# Patient Record
Sex: Female | Born: 1985
Health system: Southern US, Community
[De-identification: ages and names within clinical notes are randomized; demographics above are authoritative.]

## PROBLEM LIST (undated history)

## (undated) ENCOUNTER — Inpatient Hospital Stay (HOSPITAL_COMMUNITY): Payer: Self-pay

## (undated) DIAGNOSIS — F329 Major depressive disorder, single episode, unspecified: Secondary | ICD-10-CM

## (undated) DIAGNOSIS — F419 Anxiety disorder, unspecified: Secondary | ICD-10-CM

## (undated) DIAGNOSIS — E119 Type 2 diabetes mellitus without complications: Secondary | ICD-10-CM

## (undated) DIAGNOSIS — Z5189 Encounter for other specified aftercare: Secondary | ICD-10-CM

## (undated) DIAGNOSIS — I10 Essential (primary) hypertension: Secondary | ICD-10-CM

## (undated) DIAGNOSIS — F32A Depression, unspecified: Secondary | ICD-10-CM

## (undated) HISTORY — PX: NO PAST SURGERIES: SHX2092

---

## 2017-10-16 ENCOUNTER — Encounter (HOSPITAL_COMMUNITY): Payer: Self-pay

## 2017-10-16 LAB — OB RESULTS CONSOLE HIV ANTIBODY (ROUTINE TESTING): HIV: NONREACTIVE

## 2017-10-16 LAB — OB RESULTS CONSOLE RPR: RPR: NONREACTIVE

## 2017-10-16 LAB — OB RESULTS CONSOLE ABO/RH: RH Type: POSITIVE

## 2017-10-16 LAB — OB RESULTS CONSOLE HEPATITIS B SURFACE ANTIGEN: HEP B S AG: NEGATIVE

## 2017-10-16 LAB — OB RESULTS CONSOLE RUBELLA ANTIBODY, IGM: Rubella: IMMUNE

## 2017-10-23 ENCOUNTER — Other Ambulatory Visit (HOSPITAL_COMMUNITY): Payer: Self-pay | Admitting: Obstetrics and Gynecology

## 2017-10-23 DIAGNOSIS — Z3689 Encounter for other specified antenatal screening: Secondary | ICD-10-CM

## 2017-10-27 ENCOUNTER — Other Ambulatory Visit: Payer: Self-pay

## 2017-10-30 ENCOUNTER — Encounter (HOSPITAL_COMMUNITY): Payer: Self-pay | Admitting: *Deleted

## 2017-10-31 ENCOUNTER — Ambulatory Visit (HOSPITAL_COMMUNITY)
Admission: RE | Admit: 2017-10-31 | Discharge: 2017-10-31 | Disposition: A | Discharge status: Home / Self Care | Payer: Medicaid Other | Source: Ambulatory Visit | Attending: Obstetrics and Gynecology | Admitting: Obstetrics and Gynecology

## 2017-10-31 HISTORY — DX: Type 2 diabetes mellitus without complications: E11.9

## 2017-11-01 ENCOUNTER — Ambulatory Visit (HOSPITAL_COMMUNITY)
Admission: RE | Admit: 2017-11-01 | Payer: Medicaid Other | Source: Ambulatory Visit | Attending: Obstetrics & Gynecology | Admitting: Obstetrics & Gynecology

## 2017-11-06 ENCOUNTER — Encounter (HOSPITAL_COMMUNITY): Payer: Self-pay

## 2017-11-11 ENCOUNTER — Encounter (HOSPITAL_COMMUNITY): Payer: Self-pay

## 2017-11-13 ENCOUNTER — Other Ambulatory Visit (HOSPITAL_COMMUNITY): Payer: Self-pay | Admitting: *Deleted

## 2017-11-13 ENCOUNTER — Ambulatory Visit (HOSPITAL_COMMUNITY)
Admission: RE | Admit: 2017-11-13 | Payer: Medicaid Other | Source: Ambulatory Visit | Attending: Obstetrics & Gynecology | Admitting: Obstetrics & Gynecology

## 2017-11-13 ENCOUNTER — Ambulatory Visit (HOSPITAL_COMMUNITY)
Admission: RE | Admit: 2017-11-13 | Discharge: 2017-11-13 | Disposition: A | Discharge status: Home / Self Care | Payer: Medicaid Other | Source: Ambulatory Visit | Attending: Obstetrics and Gynecology | Admitting: Obstetrics and Gynecology

## 2017-11-13 ENCOUNTER — Ambulatory Visit (HOSPITAL_COMMUNITY): Payer: Medicaid Other

## 2017-11-13 ENCOUNTER — Encounter (HOSPITAL_COMMUNITY): Payer: Self-pay

## 2017-11-13 DIAGNOSIS — O30042 Twin pregnancy, dichorionic/diamniotic, second trimester: Secondary | ICD-10-CM | POA: Diagnosis present

## 2017-11-13 DIAGNOSIS — O24112 Pre-existing diabetes mellitus, type 2, in pregnancy, second trimester: Secondary | ICD-10-CM | POA: Insufficient documentation

## 2017-11-13 DIAGNOSIS — E119 Type 2 diabetes mellitus without complications: Secondary | ICD-10-CM

## 2017-11-13 DIAGNOSIS — Z363 Encounter for antenatal screening for malformations: Secondary | ICD-10-CM | POA: Insufficient documentation

## 2017-11-13 DIAGNOSIS — O99212 Obesity complicating pregnancy, second trimester: Secondary | ICD-10-CM

## 2017-11-13 DIAGNOSIS — O289 Unspecified abnormal findings on antenatal screening of mother: Secondary | ICD-10-CM | POA: Diagnosis not present

## 2017-11-13 DIAGNOSIS — Z3A20 20 weeks gestation of pregnancy: Secondary | ICD-10-CM | POA: Diagnosis not present

## 2017-11-13 DIAGNOSIS — Z3689 Encounter for other specified antenatal screening: Secondary | ICD-10-CM

## 2017-11-13 HISTORY — DX: Essential (primary) hypertension: I10

## 2017-11-22 ENCOUNTER — Ambulatory Visit (HOSPITAL_COMMUNITY)
Admission: RE | Admit: 2017-11-22 | Payer: Medicaid Other | Source: Ambulatory Visit | Attending: Obstetrics & Gynecology | Admitting: Obstetrics & Gynecology

## 2017-11-22 ENCOUNTER — Encounter (HOSPITAL_COMMUNITY): Payer: Self-pay

## 2017-12-11 ENCOUNTER — Ambulatory Visit (HOSPITAL_COMMUNITY)
Admission: RE | Admit: 2017-12-11 | Discharge: 2017-12-11 | Disposition: A | Discharge status: Home / Self Care | Payer: Medicaid Other | Source: Ambulatory Visit | Attending: Obstetrics and Gynecology | Admitting: Obstetrics and Gynecology

## 2017-12-11 ENCOUNTER — Encounter (HOSPITAL_COMMUNITY): Payer: Self-pay

## 2017-12-11 ENCOUNTER — Ambulatory Visit (HOSPITAL_COMMUNITY): Payer: Medicaid Other

## 2018-01-18 ENCOUNTER — Encounter (HOSPITAL_COMMUNITY): Payer: Self-pay

## 2018-01-18 ENCOUNTER — Emergency Department (HOSPITAL_COMMUNITY)
Admission: EM | Admit: 2018-01-18 | Discharge: 2018-01-18 | Disposition: A | Discharge status: Home / Self Care | Payer: Medicaid Other | Attending: Emergency Medicine | Admitting: Emergency Medicine

## 2018-01-18 ENCOUNTER — Other Ambulatory Visit: Payer: Self-pay

## 2018-01-18 DIAGNOSIS — Z3A30 30 weeks gestation of pregnancy: Secondary | ICD-10-CM | POA: Insufficient documentation

## 2018-01-18 DIAGNOSIS — Z79899 Other long term (current) drug therapy: Secondary | ICD-10-CM | POA: Diagnosis not present

## 2018-01-18 DIAGNOSIS — O219 Vomiting of pregnancy, unspecified: Secondary | ICD-10-CM | POA: Diagnosis present

## 2018-01-18 DIAGNOSIS — I10 Essential (primary) hypertension: Secondary | ICD-10-CM | POA: Diagnosis not present

## 2018-01-18 DIAGNOSIS — E119 Type 2 diabetes mellitus without complications: Secondary | ICD-10-CM | POA: Diagnosis not present

## 2018-01-18 DIAGNOSIS — Z794 Long term (current) use of insulin: Secondary | ICD-10-CM | POA: Insufficient documentation

## 2018-01-18 DIAGNOSIS — O30043 Twin pregnancy, dichorionic/diamniotic, third trimester: Secondary | ICD-10-CM | POA: Insufficient documentation

## 2018-01-18 DIAGNOSIS — F17210 Nicotine dependence, cigarettes, uncomplicated: Secondary | ICD-10-CM | POA: Diagnosis not present

## 2018-01-18 LAB — COMPREHENSIVE METABOLIC PANEL
ALK PHOS: 87 U/L (ref 38–126)
ALT: 8 U/L (ref 0–44)
AST: 13 U/L — AB (ref 15–41)
Albumin: 2.7 g/dL — ABNORMAL LOW (ref 3.5–5.0)
Anion gap: 12 (ref 5–15)
BILIRUBIN TOTAL: 1.2 mg/dL (ref 0.3–1.2)
BUN: 7 mg/dL (ref 6–20)
CO2: 19 mmol/L — ABNORMAL LOW (ref 22–32)
CREATININE: 0.55 mg/dL (ref 0.44–1.00)
Calcium: 9 mg/dL (ref 8.9–10.3)
Chloride: 100 mmol/L (ref 98–111)
GFR calc Af Amer: >60 mL/min (ref >60)
Glucose, Bld: 179 mg/dL — ABNORMAL HIGH (ref 70–99)
Potassium: 3.7 mmol/L (ref 3.5–5.1)
Sodium: 131 mmol/L — ABNORMAL LOW (ref 135–145)
TOTAL PROTEIN: 6.6 g/dL (ref 6.5–8.1)

## 2018-01-18 LAB — LIPASE, BLOOD: LIPASE: 28 U/L (ref 11–51)

## 2018-01-18 LAB — CBC WITH DIFFERENTIAL/PLATELET
Abs Immature Granulocytes: 0.03 10*3/uL (ref 0.00–0.07)
BASOS PCT: 0 %
Basophils Absolute: 0 10*3/uL (ref 0.0–0.1)
EOS PCT: 1 %
Eosinophils Absolute: 0.1 10*3/uL (ref 0.0–0.5)
HEMATOCRIT: 44.3 % (ref 36.0–46.0)
Hemoglobin: 15.3 g/dL — ABNORMAL HIGH (ref 12.0–15.0)
IMMATURE GRANULOCYTES: 0 %
LYMPHS PCT: 24 %
Lymphs Abs: 2.5 10*3/uL (ref 0.7–4.0)
MCH: 30.2 pg (ref 26.0–34.0)
MCHC: 34.5 g/dL (ref 30.0–36.0)
MCV: 87.4 fL (ref 80.0–100.0)
MONO ABS: 0.9 10*3/uL (ref 0.1–1.0)
Monocytes Relative: 9 %
Neutro Abs: 7.2 10*3/uL (ref 1.7–7.7)
Neutrophils Relative %: 66 %
PLATELETS: 255 10*3/uL (ref 150–400)
RBC: 5.07 MIL/uL (ref 3.87–5.11)
RDW: 12.9 % (ref 11.5–15.5)
WBC: 10.7 10*3/uL — AB (ref 4.0–10.5)
nRBC: 0 % (ref 0.0–0.2)

## 2018-01-18 LAB — URINALYSIS, ROUTINE W REFLEX MICROSCOPIC
Bilirubin Urine: NEGATIVE
GLUCOSE, UA: 150 mg/dL — AB
Hgb urine dipstick: NEGATIVE
Ketones, ur: 80 mg/dL — AB
Nitrite: POSITIVE — AB
PH: 6 (ref 5.0–8.0)
Protein, ur: 100 mg/dL — AB
Specific Gravity, Urine: 1.029 (ref 1.005–1.030)

## 2018-01-18 LAB — CBG MONITORING, ED: Glucose-Capillary: 189 mg/dL — ABNORMAL HIGH (ref 70–99)

## 2018-01-18 MED ORDER — METOCLOPRAMIDE HCL 10 MG PO TABS: 10.0000 mg | ORAL_TABLET | Freq: Four times a day (QID) | ORAL | 0 refills | Status: DC | PRN

## 2018-01-18 MED ORDER — AMOXICILLIN 500 MG PO CAPS: 500.0000 mg | ORAL_CAPSULE | Freq: Three times a day (TID) | ORAL | 0 refills | Status: DC

## 2018-01-18 MED ORDER — METOCLOPRAMIDE HCL 5 MG/ML IJ SOLN
5.0000 mg | Freq: Once | INTRAMUSCULAR | Status: AC
  Administered 2018-01-18: 5 mg via INTRAVENOUS
  Filled 2018-01-18: qty 2

## 2018-01-18 MED ORDER — AMOXICILLIN 500 MG PO CAPS
500.0000 mg | ORAL_CAPSULE | Freq: Once | ORAL | Status: AC
  Administered 2018-01-18: 500 mg via ORAL
  Filled 2018-01-18: qty 1

## 2018-01-18 MED ORDER — SODIUM CHLORIDE 0.9 % IV BOLUS
2000.0000 mL | Freq: Once | INTRAVENOUS | Status: AC
  Administered 2018-01-18: 2000 mL via INTRAVENOUS

## 2018-01-18 NOTE — ED Provider Notes (Signed)
[facility] EMERGENCY [facility] Provider Note   CSN: [id number] Arrival date & time: 01/18/18  1036     History   Chief Complaint Chief Complaint  Patient presents with  . Emesis    HPI Ellen KaufmannJennifer Martin is a 32 y.o. female.  Patient reports she is been vomiting for 2 days and has not been able to hold anything down without vomiting for the past 2 days.  She denies pain anywhere denies fever denies urinary complaint.  She feels mildly nauseated at present.  She does feel the baby is moving normally.  She has not recently checked her blood sugar.  Other associated symptoms include generalized weakness.  She is currently pregnant with twins, followed in high risk clinic.  HPI  Past Medical History:  Diagnosis Date  . Diabetes mellitus without complication (HCC)    Type 2  . Hypertension     There are no active problems to display for this patient.   Past Surgical History:  Procedure Laterality Date  . NO PAST SURGERIES       OB History    Gravida  7   Para  3   Term  3   Preterm      AB  3   Living  3     SAB  3   TAB      Ectopic      Multiple      Live Births             Currently pregnant with twins.  3 miscarriages.  3 term vaginal deliveries  Home Medications    Prior to Admission medications   Medication Sig Start Date End Date Taking? Authorizing Provider  Acetaminophen (TYLENOL PO) Take by mouth.    [provider]  insulin NPH Human (HUMULIN N,NOVOLIN N) 100 UNIT/ML injection Inject into the skin.    [provider]  insulin regular (NOVOLIN R,HUMULIN R) 100 units/mL injection Inject into the skin 3 (three) times daily before meals.    [provider]  Prenatal Vit-Fe Fumarate-FA (PRENATAL VITAMIN PO) Take by mouth.    [provider]    Family History Family History  Problem Relation Age of Onset  . Hypertension Mother   . Diabetes Mother   . Hypertension Maternal Grandmother    . Diabetes Maternal Grandmother     Social History Social History   Tobacco Use  . Smoking status: Current Every Day Smoker    Packs/day: 0.25  . Smokeless tobacco: Never Used  Substance Use Topics  . Alcohol use: Not Currently  . Drug use: Not Currently   No alcohol no drugs  Allergies   Patient has no known allergies.   Review of Systems Review of Systems  Constitutional: Negative.   HENT: Negative.   Respiratory: Negative.   Cardiovascular: Negative.   Gastrointestinal: Positive for nausea and vomiting.  Musculoskeletal: Negative.   Skin: Negative.   Allergic/Immunologic: Positive for immunocompromised state.       Diabetic  Neurological: Positive for weakness.       Generalized weakness  Psychiatric/Behavioral: Negative.   All other systems reviewed and are negative.    Physical Exam Updated Vital Signs LMP 07/12/2017   Physical Exam  Constitutional: She appears well-developed and well-nourished. No distress.  HENT:  Head: Normocephalic and atraumatic.  Mucous membranes dry  Eyes: Pupils are equal, round, and reactive to light. Conjunctivae are normal.  Neck: Neck supple. No tracheal deviation present. No thyromegaly present.  Cardiovascular: Regular rhythm.  No murmur heard. Mildly tachycardic  Pulmonary/Chest: Effort normal and breath sounds normal.  Abdominal: Soft. Bowel sounds are normal. She exhibits no distension. There is no tenderness.  Gravid.  There are 2 separate fetal heart tones.  Baby A 135 to 140 bpm.  BabyB 145-150 bpm  Musculoskeletal: Normal range of motion. She exhibits no edema or tenderness.  Neurological: She is alert. Coordination normal.  Skin: Skin is warm and dry. No rash noted.  Psychiatric: She has a normal mood and affect.  Nursing note and vitals reviewed.    ED Treatments / Results  Labs (all labs ordered are listed, but only abnormal results are displayed) Labs Reviewed  URINALYSIS, ROUTINE W REFLEX MICROSCOPIC    COMPREHENSIVE METABOLIC PANEL  CBC WITH DIFFERENTIAL/PLATELET  LIPASE, BLOOD   Results for orders placed or performed during the hospital encounter of 01/18/18  Urinalysis, Routine w reflex microscopic  Result Value Ref Range   Color, Urine AMBER (A) YELLOW   APPearance HAZY (A) CLEAR   Specific Gravity, Urine 1.029 1.005 - 1.030   pH 6.0 5.0 - 8.0   Glucose, UA 150 (A) NEGATIVE mg/dL   Hgb urine dipstick NEGATIVE NEGATIVE   Bilirubin Urine NEGATIVE NEGATIVE   Ketones, ur 80 (A) NEGATIVE mg/dL   Protein, ur 096 (A) NEGATIVE mg/dL   Nitrite POSITIVE (A) NEGATIVE   Leukocytes, UA TRACE (A) NEGATIVE   RBC / HPF 0-5 0 - 5 RBC/hpf   WBC, UA 6-10 0 - 5 WBC/hpf   Bacteria, UA MANY (A) NONE SEEN   Squamous Epithelial / LPF 0-5 0 - 5   Mucus PRESENT   Comprehensive metabolic panel  Result Value Ref Range   Sodium 131 (L) 135 - 145 mmol/L   Potassium 3.7 3.5 - 5.1 mmol/L   Chloride 100 98 - 111 mmol/L   CO2 19 (L) 22 - 32 mmol/L   Glucose, Bld 179 (H) 70 - 99 mg/dL   BUN 7 6 - 20 mg/dL   Creatinine, Ser 0.45 0.44 - 1.00 mg/dL   Calcium 9.0 8.9 - 40.9 mg/dL   Total Protein 6.6 6.5 - 8.1 g/dL   Albumin 2.7 (L) 3.5 - 5.0 g/dL   AST 13 (L) 15 - 41 U/L   ALT 8 0 - 44 U/L   Alkaline Phosphatase 87 38 - 126 U/L   Total Bilirubin 1.2 0.3 - 1.2 mg/dL   GFR calc non Af Amer >60 >60 mL/min   GFR calc Af Amer >60 >60 mL/min   Anion gap 12 5 - 15  CBC with Differential/Platelet  Result Value Ref Range   WBC 10.7 (H) 4.0 - 10.5 K/uL   RBC 5.07 3.87 - 5.11 MIL/uL   Hemoglobin 15.3 (H) 12.0 - 15.0 g/dL   HCT 81.1 91.4 - 78.2 %   MCV 87.4 80.0 - 100.0 fL   MCH 30.2 26.0 - 34.0 pg   MCHC 34.5 30.0 - 36.0 g/dL   RDW 95.6 21.3 - 08.6 %   Platelets 255 150 - 400 K/uL   nRBC 0.0 0.0 - 0.2 %   Neutrophils Relative % 66 %   Neutro Abs 7.2 1.7 - 7.7 K/uL   Lymphocytes Relative 24 %   Lymphs Abs 2.5 0.7 - 4.0 K/uL   Monocytes Relative 9 %   Monocytes Absolute 0.9 0.1 - 1.0 K/uL    Eosinophils Relative 1 %   Eosinophils Absolute 0.1 0.0 - 0.5 K/uL   Basophils Relative 0 %  Basophils Absolute 0.0 0.0 - 0.1 K/uL   Immature Granulocytes 0 %   Abs Immature Granulocytes 0.03 0.00 - 0.07 K/uL  Lipase, blood  Result Value Ref Range   Lipase 28 11 - 51 U/L  CBG monitoring, ED  Result Value Ref Range   Glucose-Capillary 189 (H) 70 - 99 mg/dL   No results found.  EKG None  Radiology No results found.  Procedures Procedures (including critical care time)  Medications Ordered in ED Medications  sodium chloride 0.9 % bolus 2,000 mL (has no administration in time range)     Initial Impression / Assessment and Plan / ED Course  I have reviewed the triage vital signs and the nursing notes.  Pertinent labs & imaging results that were available during my care of the patient were reviewed by me and considered in my medical decision making (see chart for details).     11:55 AM patient reports transient improvement of nausea after treatment with intravenous Reglan.  And IV fluids however nauseous at this time coming back somewhat.  Additional IV Reglan ordered 1:30 PM patient feels much improved after treatment with intravenous hydration.  She is able to drink water without nausea or vomiting.  She is not lightheaded on standing.  Is ready for discharge.  I Counseled patient for 5 minutes on smoking cessation plan encourage oral hydration.  Follow-up with OB/GYN next week.  Urine sent for culture.  Lab work consistent with bacteriuria. prescriptions Reglan, amoxicillin.  She is encouraged to try ginger for vomiting before Reglan. Final Clinical Impressions(s) / ED Diagnoses  Diagnoses #1 vomiting during pregnancy #2 bacteriuria #3 tobacco abuse Final diagnoses:  None    ED Discharge Orders    None       Doug Sou, MD 01/18/18 1342

## 2018-01-18 NOTE — ED Triage Notes (Signed)
Pt currently pregnant with twins with continuous N/v x 2 days. Called [facility] where she is followed as a high risk patient and was told to come to ER for evaluation and treatment. 7th pregnancy, 3 miscarriages. Current pregnancy without any other complications, feeling fetal mvmts. Denies fever, chills. Reports weakness. Currently smokes, denies alcohol use. Hx DM, has not checked cbg today. Dr. Ethelda ChickJacubowitz at bedside.

## 2018-01-18 NOTE — ED Notes (Signed)
Got patient undress into a gown on the the monitor checked patient cbg it was [id number] notified Dr Ethelda ChickJacubowitz of blood sugar patient is resting with call bell in reach and nurse at bedside

## 2018-01-18 NOTE — Discharge Instructions (Signed)
Make sure that you drink at least six 8 ounce glasses of water or each day in order to stay well-hydrated.  Try ginger for nausea and vomiting.  If Ginger does not work you can take the medication prescribed as needed for nausea.  Follow-up with your OB/GYN doctor next week.  Ask your doctor to help you to stop smoking.  If your vomiting is not well controlled with the medication prescribed or if you feel worse for any reason go to the maternity admissions unit at [facility]

## 2018-01-18 NOTE — Progress Notes (Signed)
Pt is a G7P3 at 30 1/[redacted] weeks gestation here because she has had N&V x 2 days. Says the last time she vomited was last night around 8 pm. Denies any diarreah. Denies vaginal bleeding or leaking of fluid. Says she had gestational diabetes with her last pregnancy and has cont to be insulin diabetic. Pt says she is a pt of [facility] and sees Nigel BridgemanVicki Latham CNM there. Pt says she has not checked her blood sugar in a couple of days. Glucose drawn here is 189. Pt has Mercie Eoni Di twins.

## 2018-01-18 NOTE — Progress Notes (Signed)
Spoke with Dr. Richardson Doppole. Pt is a G7p3 at 30 1/[redacted] weeks gestation here because she is having N&V. She is a  Pt of [facility] and sees Nigel BridgemanVicki Latham CNM there. She is pregnant with Mercie Eoni Di twins. Baby A FHR is 145-150 BPM, Baby B FHR is 135-140. Mod variability, accels, no decels for either baby. Ui noted. Pt denies uc's. No vaginal bleeding or leaking of fluid. Pt is a insulin dependentt type 2 diabetic. Her blood glucose is 179. Pt has a UTI. She is getting IVF, IV reglan. Dr. Richardson Doppole says that pt can be hydrated, treated for her UTI, she may have zoran if the reglan doesn't work. She is OB cleared. ED staff notified.

## 2018-01-20 LAB — URINE CULTURE
Culture: >=100000 — AB
Special Requests: NORMAL

## 2018-01-21 ENCOUNTER — Telehealth: Payer: Self-pay | Admitting: Emergency Medicine

## 2018-01-21 NOTE — Telephone Encounter (Signed)
Post ED Visit - Positive Culture Follow-up  Culture report reviewed by antimicrobial stewardship pharmacist:  []  Enzo BiNathan Batchelder, Pharm.D. []  Celedonio MiyamotoJeremy Frens, Pharm.D., BCPS AQ-ID []  Garvin FilaMike Maccia, Pharm.D., BCPS []  Georgina PillionElizabeth Martin, Pharm.D., BCPS []  ManahawkinMinh Pham, [address]Pharm.D., BCPS, AAHIVP []  Estella HuskMichelle Turner, Pharm.D., BCPS, AAHIVP [x]  Lysle Pearlachel Rumbarger, PharmD, BCPS []  Phillips Climeshuy Dang, PharmD, BCPS []  Agapito GamesAlison Masters, PharmD, BCPS []  Verlan FriendsErin Deja, PharmD  Positive urine culture Treated with amoxicillin, organism sensitive to the same and no further patient follow-up is required at this time.  Berle MullMiller, Ryen Heitmeyer 01/21/2018, 11:06 AM

## 2018-01-25 ENCOUNTER — Inpatient Hospital Stay (HOSPITAL_COMMUNITY): Payer: Medicaid Other

## 2018-01-25 ENCOUNTER — Inpatient Hospital Stay (HOSPITAL_COMMUNITY)
Admission: AD | Admit: 2018-01-25 | Discharge: 2018-01-30 | DRG: 832 | Disposition: A | Discharge status: Home / Self Care | Payer: Medicaid Other | Attending: Obstetrics & Gynecology | Admitting: Obstetrics & Gynecology

## 2018-01-25 ENCOUNTER — Inpatient Hospital Stay (HOSPITAL_BASED_OUTPATIENT_CLINIC_OR_DEPARTMENT_OTHER): Payer: Medicaid Other

## 2018-01-25 ENCOUNTER — Encounter (HOSPITAL_COMMUNITY): Payer: Self-pay

## 2018-01-25 DIAGNOSIS — F1910 Other psychoactive substance abuse, uncomplicated: Secondary | ICD-10-CM | POA: Diagnosis present

## 2018-01-25 DIAGNOSIS — K8020 Calculus of gallbladder without cholecystitis without obstruction: Secondary | ICD-10-CM

## 2018-01-25 DIAGNOSIS — O99323 Drug use complicating pregnancy, third trimester: Secondary | ICD-10-CM | POA: Diagnosis present

## 2018-01-25 DIAGNOSIS — O99613 Diseases of the digestive system complicating pregnancy, third trimester: Secondary | ICD-10-CM | POA: Diagnosis present

## 2018-01-25 DIAGNOSIS — O99213 Obesity complicating pregnancy, third trimester: Secondary | ICD-10-CM

## 2018-01-25 DIAGNOSIS — O26893 Other specified pregnancy related conditions, third trimester: Secondary | ICD-10-CM

## 2018-01-25 DIAGNOSIS — O139 Gestational [pregnancy-induced] hypertension without significant proteinuria, unspecified trimester: Secondary | ICD-10-CM | POA: Diagnosis present

## 2018-01-25 DIAGNOSIS — O30043 Twin pregnancy, dichorionic/diamniotic, third trimester: Secondary | ICD-10-CM

## 2018-01-25 DIAGNOSIS — N390 Urinary tract infection, site not specified: Secondary | ICD-10-CM

## 2018-01-25 DIAGNOSIS — O289 Unspecified abnormal findings on antenatal screening of mother: Secondary | ICD-10-CM | POA: Diagnosis not present

## 2018-01-25 DIAGNOSIS — E119 Type 2 diabetes mellitus without complications: Secondary | ICD-10-CM

## 2018-01-25 DIAGNOSIS — E1165 Type 2 diabetes mellitus with hyperglycemia: Secondary | ICD-10-CM | POA: Diagnosis present

## 2018-01-25 DIAGNOSIS — F17210 Nicotine dependence, cigarettes, uncomplicated: Secondary | ICD-10-CM | POA: Diagnosis present

## 2018-01-25 DIAGNOSIS — Z794 Long term (current) use of insulin: Secondary | ICD-10-CM | POA: Diagnosis not present

## 2018-01-25 DIAGNOSIS — Z3A31 31 weeks gestation of pregnancy: Secondary | ICD-10-CM | POA: Diagnosis not present

## 2018-01-25 DIAGNOSIS — O99820 Streptococcus B carrier state complicating pregnancy: Secondary | ICD-10-CM | POA: Diagnosis present

## 2018-01-25 DIAGNOSIS — O24419 Gestational diabetes mellitus in pregnancy, unspecified control: Secondary | ICD-10-CM

## 2018-01-25 DIAGNOSIS — O2343 Unspecified infection of urinary tract in pregnancy, third trimester: Secondary | ICD-10-CM | POA: Diagnosis present

## 2018-01-25 DIAGNOSIS — O24913 Unspecified diabetes mellitus in pregnancy, third trimester: Secondary | ICD-10-CM | POA: Diagnosis not present

## 2018-01-25 DIAGNOSIS — O30042 Twin pregnancy, dichorionic/diamniotic, second trimester: Secondary | ICD-10-CM | POA: Diagnosis not present

## 2018-01-25 DIAGNOSIS — O99333 Smoking (tobacco) complicating pregnancy, third trimester: Secondary | ICD-10-CM | POA: Diagnosis present

## 2018-01-25 DIAGNOSIS — B9620 Unspecified Escherichia coli [E. coli] as the cause of diseases classified elsewhere: Secondary | ICD-10-CM | POA: Diagnosis present

## 2018-01-25 DIAGNOSIS — IMO0002 Reserved for concepts with insufficient information to code with codable children: Secondary | ICD-10-CM

## 2018-01-25 DIAGNOSIS — O24113 Pre-existing diabetes mellitus, type 2, in pregnancy, third trimester: Secondary | ICD-10-CM

## 2018-01-25 DIAGNOSIS — O99212 Obesity complicating pregnancy, second trimester: Secondary | ICD-10-CM | POA: Diagnosis not present

## 2018-01-25 DIAGNOSIS — R109 Unspecified abdominal pain: Secondary | ICD-10-CM

## 2018-01-25 DIAGNOSIS — O24313 Unspecified pre-existing diabetes mellitus in pregnancy, third trimester: Secondary | ICD-10-CM

## 2018-01-25 DIAGNOSIS — O24112 Pre-existing diabetes mellitus, type 2, in pregnancy, second trimester: Secondary | ICD-10-CM | POA: Diagnosis not present

## 2018-01-25 DIAGNOSIS — O24119 Pre-existing diabetes mellitus, type 2, in pregnancy, unspecified trimester: Secondary | ICD-10-CM

## 2018-01-25 DIAGNOSIS — R1011 Right upper quadrant pain: Secondary | ICD-10-CM | POA: Diagnosis present

## 2018-01-25 LAB — COMPREHENSIVE METABOLIC PANEL
ALT: 10 U/L (ref 0–44)
ANION GAP: 10 (ref 5–15)
AST: 14 U/L — ABNORMAL LOW (ref 15–41)
Albumin: 2.6 g/dL — ABNORMAL LOW (ref 3.5–5.0)
Alkaline Phosphatase: 83 U/L (ref 38–126)
BUN: 9 mg/dL (ref 6–20)
CALCIUM: 8.5 mg/dL — AB (ref 8.9–10.3)
CO2: 18 mmol/L — AB (ref 22–32)
CREATININE: 0.44 mg/dL (ref 0.44–1.00)
Chloride: 103 mmol/L (ref 98–111)
GFR calc Af Amer: >60 mL/min (ref >60)
GFR calc non Af Amer: >60 mL/min (ref >60)
GLUCOSE: 208 mg/dL — AB (ref 70–99)
Potassium: 3.5 mmol/L (ref 3.5–5.1)
Sodium: 131 mmol/L — ABNORMAL LOW (ref 135–145)
Total Bilirubin: 0.8 mg/dL (ref 0.3–1.2)
Total Protein: 6.2 g/dL — ABNORMAL LOW (ref 6.5–8.1)

## 2018-01-25 LAB — PROTEIN / CREATININE RATIO, URINE
Creatinine, Urine: 191 mg/dL
PROTEIN CREATININE RATIO: 0.23 mg/mg{creat} — AB (ref 0.00–0.15)
Total Protein, Urine: 44 mg/dL

## 2018-01-25 LAB — GLUCOSE, CAPILLARY
GLUCOSE-CAPILLARY: 194 mg/dL — AB (ref 70–99)
Glucose-Capillary: 191 mg/dL — ABNORMAL HIGH (ref 70–99)
Glucose-Capillary: 175 mg/dL — ABNORMAL HIGH (ref 70–99)
Glucose-Capillary: 160 mg/dL — ABNORMAL HIGH (ref 70–99)

## 2018-01-25 LAB — CBC
HEMATOCRIT: 39.4 % (ref 36.0–46.0)
HEMOGLOBIN: 14 g/dL (ref 12.0–15.0)
MCH: 31.3 pg (ref 26.0–34.0)
MCHC: 35.5 g/dL (ref 30.0–36.0)
MCV: 87.9 fL (ref 80.0–100.0)
NRBC: 0 % (ref 0.0–0.2)
PLATELETS: 219 10*3/uL (ref 150–400)
RBC: 4.48 MIL/uL (ref 3.87–5.11)
RDW: 13.4 % (ref 11.5–15.5)
WBC: 11.3 10*3/uL — AB (ref 4.0–10.5)

## 2018-01-25 LAB — URINALYSIS, ROUTINE W REFLEX MICROSCOPIC
Bilirubin Urine: NEGATIVE
KETONES UR: 20 mg/dL — AB
LEUKOCYTES UA: NEGATIVE
Nitrite: NEGATIVE
PH: 5 (ref 5.0–8.0)
Protein, ur: 30 mg/dL — AB
RBC / HPF: >50 RBC/hpf — ABNORMAL HIGH (ref 0–5)
Specific Gravity, Urine: 1.026 (ref 1.005–1.030)
WBC, UA: >50 WBC/hpf — ABNORMAL HIGH (ref 0–5)

## 2018-01-25 LAB — OB RESULTS CONSOLE GBS: STREP GROUP B AG: POSITIVE

## 2018-01-25 LAB — TYPE AND SCREEN
ABO/RH(D): O POS
ANTIBODY SCREEN: NEGATIVE

## 2018-01-25 LAB — LIPASE, BLOOD: Lipase: 23 U/L (ref 11–51)

## 2018-01-25 LAB — RAPID URINE DRUG SCREEN, HOSP PERFORMED
AMPHETAMINES: POSITIVE — AB
BARBITURATES: NOT DETECTED
BENZODIAZEPINES: NOT DETECTED
Cocaine: NOT DETECTED
Opiates: NOT DETECTED
Tetrahydrocannabinol: POSITIVE — AB

## 2018-01-25 LAB — ABO/RH: ABO/RH(D): O POS

## 2018-01-25 LAB — GROUP B STREP BY PCR: Group B strep by PCR: POSITIVE — AB

## 2018-01-25 MED ORDER — MAGNESIUM SULFATE 40 G IN LACTATED RINGERS - SIMPLE
2.0000 g/h | INTRAVENOUS | Status: DC
  Administered 2018-01-25: 2 g/h via INTRAVENOUS
  Filled 2018-01-25: qty 500

## 2018-01-25 MED ORDER — FENTANYL CITRATE (PF) 100 MCG/2ML IJ SOLN
100.0000 ug | Freq: Once | INTRAMUSCULAR | Status: AC
  Administered 2018-01-25: 100 ug via INTRAVENOUS
  Filled 2018-01-25: qty 2

## 2018-01-25 MED ORDER — FENTANYL CITRATE (PF) 100 MCG/2ML IJ SOLN
100.0000 ug | INTRAMUSCULAR | Status: DC | PRN
  Administered 2018-01-25: 100 ug via INTRAVENOUS
  Filled 2018-01-25: qty 2

## 2018-01-25 MED ORDER — HYOSCYAMINE SULFATE 0.125 MG SL SUBL
0.2500 mg | SUBLINGUAL_TABLET | Freq: Once | SUBLINGUAL | Status: DC
  Filled 2018-01-25: qty 2

## 2018-01-25 MED ORDER — CALCIUM CARBONATE ANTACID 500 MG PO CHEW
2.0000 | CHEWABLE_TABLET | ORAL | Status: DC | PRN
  Administered 2018-01-28: 400 mg via ORAL

## 2018-01-25 MED ORDER — ALUM & MAG HYDROXIDE-SIMETH 200-200-20 MG/5ML PO SUSP
30.0000 mL | Freq: Once | ORAL | Status: AC
  Administered 2018-01-25: 30 mL via ORAL
  Filled 2018-01-25: qty 30

## 2018-01-25 MED ORDER — ONDANSETRON 4 MG PO TBDP
4.0000 mg | ORAL_TABLET | Freq: Once | ORAL | Status: AC
  Administered 2018-01-28: 4 mg via ORAL
  Filled 2018-01-25: qty 1

## 2018-01-25 MED ORDER — INSULIN ASPART 100 UNIT/ML ~~LOC~~ SOLN
0.0000 [IU] | Freq: Four times a day (QID) | SUBCUTANEOUS | Status: DC
  Administered 2018-01-25 – 2018-01-27 (×5): 4 [IU] via SUBCUTANEOUS

## 2018-01-25 MED ORDER — DOCUSATE SODIUM 100 MG PO CAPS
100.0000 mg | ORAL_CAPSULE | Freq: Every day | ORAL | Status: DC
  Administered 2018-01-26 – 2018-01-30 (×5): 100 mg via ORAL
  Filled 2018-01-25 (×5): qty 1

## 2018-01-25 MED ORDER — POTASSIUM CHLORIDE 2 MEQ/ML IV SOLN
INTRAVENOUS | Status: DC
  Administered 2018-01-25 (×2): via INTRAVENOUS
  Filled 2018-01-25 (×3): qty 1000

## 2018-01-25 MED ORDER — FENTANYL CITRATE (PF) 100 MCG/2ML IJ SOLN
INTRAMUSCULAR | Status: AC
  Filled 2018-01-25: qty 2

## 2018-01-25 MED ORDER — FENTANYL CITRATE (PF) 100 MCG/2ML IJ SOLN
100.0000 ug | Freq: Once | INTRAMUSCULAR | Status: AC
  Administered 2018-01-25: 100 ug via INTRAVENOUS

## 2018-01-25 MED ORDER — INSULIN ASPART 100 UNIT/ML ~~LOC~~ SOLN
0.0000 [IU] | Freq: Three times a day (TID) | SUBCUTANEOUS | Status: DC
  Administered 2018-01-25: 4 [IU] via SUBCUTANEOUS

## 2018-01-25 MED ORDER — FENTANYL CITRATE (PF) 250 MCG/5ML IJ SOLN
INTRAMUSCULAR | Status: AC
  Filled 2018-01-25: qty 5

## 2018-01-25 MED ORDER — BETAMETHASONE SOD PHOS & ACET 6 (3-3) MG/ML IJ SUSP
12.0000 mg | Freq: Once | INTRAMUSCULAR | Status: AC
  Administered 2018-01-25: 12 mg via INTRAMUSCULAR
  Filled 2018-01-25: qty 2

## 2018-01-25 MED ORDER — FAMOTIDINE 20 MG PO TABS
20.0000 mg | ORAL_TABLET | Freq: Two times a day (BID) | ORAL | Status: DC
  Administered 2018-01-25 – 2018-01-30 (×10): 20 mg via ORAL
  Filled 2018-01-25 (×10): qty 1

## 2018-01-25 MED ORDER — KCL-LACTATED RINGERS 20 MEQ/L IV SOLN
INTRAVENOUS | Status: DC
  Filled 2018-01-25: qty 1000

## 2018-01-25 MED ORDER — LACTATED RINGERS IV SOLN
INTRAVENOUS | Status: DC
  Administered 2018-01-25: 10:00:00 via INTRAVENOUS

## 2018-01-25 MED ORDER — BETAMETHASONE SOD PHOS & ACET 6 (3-3) MG/ML IJ SUSP
12.0000 mg | Freq: Once | INTRAMUSCULAR | Status: AC
  Administered 2018-01-26: 12 mg via INTRAMUSCULAR
  Filled 2018-01-25: qty 2

## 2018-01-25 MED ORDER — ACETAMINOPHEN 325 MG PO TABS
650.0000 mg | ORAL_TABLET | ORAL | Status: DC | PRN
  Administered 2018-01-25 – 2018-01-28 (×5): 650 mg via ORAL
  Filled 2018-01-25 (×4): qty 2

## 2018-01-25 MED ORDER — MAGNESIUM SULFATE BOLUS VIA INFUSION
6.0000 g | Freq: Once | INTRAVENOUS | Status: AC
  Administered 2018-01-25: 6 g via INTRAVENOUS
  Filled 2018-01-25: qty 500

## 2018-01-25 MED ORDER — OXYCODONE HCL 5 MG PO TABS
5.0000 mg | ORAL_TABLET | Freq: Once | ORAL | Status: AC | PRN
  Administered 2018-01-25: 5 mg via ORAL
  Filled 2018-01-25: qty 1

## 2018-01-25 MED ORDER — HYOSCYAMINE SULFATE 0.125 MG PO TBDP
0.2500 mg | ORAL_TABLET | Freq: Once | ORAL | Status: AC
  Administered 2018-01-25: 0.25 mg via SUBLINGUAL
  Filled 2018-01-25: qty 2

## 2018-01-25 MED ORDER — INSULIN REGULAR(HUMAN) IN NACL 100-0.9 UT/100ML-% IV SOLN
INTRAVENOUS | Status: DC
  Filled 2018-01-25: qty 100

## 2018-01-25 MED ORDER — LIDOCAINE VISCOUS HCL 2 % MT SOLN
15.0000 mL | Freq: Once | OROMUCOSAL | Status: AC
  Administered 2018-01-25: 15 mL via ORAL
  Filled 2018-01-25: qty 15

## 2018-01-25 MED ORDER — LACTATED RINGERS IV BOLUS
1000.0000 mL | Freq: Once | INTRAVENOUS | Status: AC
  Administered 2018-01-25: 1000 mL via INTRAVENOUS

## 2018-01-25 MED ORDER — INSULIN ASPART 100 UNIT/ML ~~LOC~~ SOLN
0.0000 [IU] | SUBCUTANEOUS | Status: DC
  Administered 2018-01-25 (×2): 4 [IU] via SUBCUTANEOUS

## 2018-01-25 MED ORDER — TERBUTALINE SULFATE 1 MG/ML IJ SOLN
INTRAMUSCULAR | Status: AC
  Filled 2018-01-25: qty 1

## 2018-01-25 MED ORDER — SOD CITRATE-CITRIC ACID 500-334 MG/5ML PO SOLN
ORAL | Status: AC
  Filled 2018-01-25: qty 15

## 2018-01-25 MED ORDER — PRENATAL MULTIVITAMIN CH: 1.0000 | ORAL_TABLET | Freq: Every day | ORAL | Status: DC

## 2018-01-25 MED ORDER — ZOLPIDEM TARTRATE 5 MG PO TABS: 5.0000 mg | ORAL_TABLET | Freq: Every evening | ORAL | Status: DC | PRN

## 2018-01-25 NOTE — Progress Notes (Signed)
Pt transferred to 304 per w/c

## 2018-01-25 NOTE — Anesthesia Pain Management Evaluation Note (Signed)
  [facility] Pain Management Visit Note  Patient: Ellen KaufmannJennifer Martin, 32 y.o., female  "Hello I am a member of the anesthesia team at [facility]. We have an anesthesia team available at all times to provide care throughout the hospital, including epidural management and anesthesia for C-section. I don't know your plan for the delivery whether it a natural birth, water birth, IV sedation, nitrous supplementation, doula or epidural, but we want to meet your pain goals."   1.Was your pain managed to your expectations on prior hospitalizations?   Yes   2.What is your expectation for pain management during this hospitalization?     Epidural  3.How can we help you reach that goal? epidura  Record the patient's initial score and the patient's pain goal.  Pain: 10/10  Pain Goal: 0/10 [facility] wants you to be able to say your pain was always managed very well.  Salome ArntSterling, Antonieta Slaven Marie 01/25/2018

## 2018-01-25 NOTE — Progress Notes (Signed)
EFM monitors removed

## 2018-01-25 NOTE — MAU Note (Signed)
Pt reports upper abdominal pain that started about 2 hours prior to arrival. No LOF or vaginal bleeding. +FM

## 2018-01-25 NOTE — Progress Notes (Signed)
Subjective: Pt feeling better, still having epigastric pain, but better.  Eating ice chips.  Not breathing with pain.  Hard to monitor twins.  Objective: BP (!) 144/83   Pulse 82   Temp (!) 97.5 F (36.4 C) (Oral)   Resp 20   Ht 5\' 9"  (1.753 m)   Wt 128.4 kg   LMP 07/12/2017   SpO2 99%   BMI 41.79 kg/m  No intake/output data recorded. Total I/O In: 1426.4 [P.O.:100; I.V.:1326.4] Out: 125 [Urine:125]  FHT: Category 1 early, hard to monitor UC:   none SVE:   Dilation: 2 Effacement (%): 80 Station: Ballotable Exam by:: [employer] Theodoros Stjames CNM  Assessment:  Pt is a [id number] at 31.1 IUP twin gestation with upper abdominal pain Twins Cholelithiasis and sludge without secondary   Plan: Discussed with Dr.Ozan  Transfer to third floor NST TID Stop Magnesium Sulfate Diabetic education GI consult.  Kenney HousemanNancy Jean Nicolas Sisler CNM, MSN 01/25/2018, 12:08 PM

## 2018-01-25 NOTE — Progress Notes (Signed)
Subjective: Pt denies pain.  States " she is hungry".  RN calls for transfer order.  Pt states has only been to [facility] x 1.  Has not taken insulin or checked sugars since 17 weeks.  Does not have a meter.  States went to [facility] x1.  UDS positive, states smoked meth yesterday.  Did have a drug problem at a younger age and went to rehab x 1.  States relapses yesterday due to mood problem and a fight with S.O. Denies drugs being a problem.  Lives with S.O. And 33 year old. Pt states "does have transportation issues and having a problem getting to appointments.  Objective: BP 138/88   Pulse 76   Temp 97.6 F (36.4 C) (Oral)   Resp 18   Ht 5\' 9"  (1.753 m)   Wt 128.4 kg   LMP 07/12/2017   SpO2 99%   BMI 41.79 kg/m  No intake/output data recorded. Total I/O In: 1696.4 [P.O.:210; I.V.:1486.4] Out: 175 [Urine:175] UDS positive for cannabis and amphetamines  Assessment:  Pt is a [id number] at 31.1 IUP twin gestation with upper abdominal pain Twins Cholelithiasis and sludge without secondary Positive drug screen  Plan: Social work consult Diabetic education consult.  GI consult Monitor BS QID  Kenney HousemanNancy Jean Chrissi Crow CNM, MSN 01/25/2018, 1:31 PM

## 2018-01-25 NOTE — H&P (Signed)
Ellen Martin is a 32 y.o. female, [phone] at 31+1 weeks with di/di twins, DM type II on insulin,  presenting for contractions and sharp RUQ pain.  Care initiated at 17 weeks.  Has not been back to office.  Patient Active Problem List   Diagnosis Date Noted  . Dichorionic diamniotic twin pregnancy in third trimester 01/25/2018    History of present pregnancy: Patient entered care at 17 weeks.   EDC of 03/26/18 was established by 17wk u/s and pt report of 12 week u/s.   Anatomy scan:  20+4 weeks, with normal findings and two posterior placentas. Baby A female 47th %ile, Baby B female 26%ile, 17% discordancy Additional [place] evaluations:  none.   Significant prenatal events:  none   Last evaluation:  MFM u/s 11/13/17  OB History    Gravida  7   Para  3   Term  3   Preterm      AB  3   Living  3     SAB  3   TAB      Ectopic      Multiple      Live Births             Past Medical History:  Diagnosis Date  . Diabetes mellitus without complication (HCC)    Type 2  . Hypertension    Past Surgical History:  Procedure Laterality Date  . NO PAST SURGERIES     Family History: family history includes Diabetes in her maternal grandmother and mother; Hypertension in her maternal grandmother and mother. Social History:  reports that she has been smoking. She has been smoking about 0.25 packs per day. She has never used smokeless tobacco. She reports that she drank alcohol. She reports that she has current or past drug history.   Prenatal Transfer Tool  Maternal Diabetes: Yes:  Diabetes Type:  Insulin/Medication controlled Genetic Screening: Normal AFP, Panorama insufficient fetal fraction Maternal Ultrasounds/Referrals: Normal Fetal Ultrasounds or other Referrals:  Fetal echo, Referred to Materal Fetal Medicine  Maternal Substance Abuse:  Yes:  Type: Marijuana, Other:  +uds on admission Significant Maternal Medications:  Meds include: Other:  Insulin Significant Maternal  Lab Results: Lab values include: Group B Strep positive  TDAP no Flu no  ROS:  All 10 systems reviewed and negative except as noted. Denies h/a, scotoma, VB, LOF. Reports +FM.  No Known Allergies   Dilation: 2 Effacement (%): 80 Station: Ballotable Exam by:: N Prothero CNM Blood pressure (!) 145/81, pulse 88, temperature 97.6 F (36.4 C), temperature source Oral, resp. rate 16, height 5' 9"  (1.753 m), weight 128.4 kg, last menstrual period 07/12/2017, SpO2 99 %.  Chest clear Heart RRR without murmur Abd gravid, Abdomen is tender to palpation with pain referred from lower abdomen to RUQ.  Pelvic: 2/80/ballotable Ext: neg for DVT  FHR: Category 1 UCs:  5-7 in 10 minutes vs irritability  Prenatal labs: ABO, Rh: --/--/O POS, O POS Performed at [facility], [address], [place], [place] [address]  [phone]) Antibody: NEG (11/23 0709) Rubella:   Imm RPR:   neg  HBsAg:   neg  HIV:   NR GBS:  positive Sickle cell/Hgb electrophoresis:  AA GC:  neg Chlamydia:  neg Genetic screenings:  AFP neg Glucola:  A1C 9.7 Other:   Hgb 14.4 at NOB  Recent Results (from the past 2160 hour(s))  CBG monitoring, ED     Status: Abnormal   Collection Time: 01/18/18 10:46 AM  Result Value Ref Range   Glucose-Capillary 189 (H) 70 - 99 mg/dL  Urinalysis, Routine w reflex microscopic     Status: Abnormal   Collection Time: 01/18/18 11:05 AM  Result Value Ref Range   Color, Urine AMBER (A) YELLOW    Comment: BIOCHEMICALS MAY BE AFFECTED BY COLOR   APPearance HAZY (A) CLEAR   Specific Gravity, Urine 1.029 1.005 - 1.030   pH 6.0 5.0 - 8.0   Glucose, UA 150 (A) NEGATIVE mg/dL   Hgb urine dipstick NEGATIVE NEGATIVE   Bilirubin Urine NEGATIVE NEGATIVE   Ketones, ur 80 (A) NEGATIVE mg/dL   Protein, ur 100 (A) NEGATIVE mg/dL   Nitrite POSITIVE (A) NEGATIVE   Leukocytes, UA TRACE (A) NEGATIVE   RBC / HPF 0-5 0 - 5 RBC/hpf   WBC, UA 6-10 0 - 5 WBC/hpf   Bacteria, UA MANY (A) NONE  SEEN   Squamous Epithelial / LPF 0-5 0 - 5   Mucus PRESENT     Comment: Performed at [facility], [address] [place], [place] [address]  Comprehensive metabolic panel     Status: Abnormal   Collection Time: 01/18/18 11:05 AM  Result Value Ref Range   Sodium 131 (L) 135 - 145 mmol/L   Potassium 3.7 3.5 - 5.1 mmol/L   Chloride 100 98 - 111 mmol/L   CO2 19 (L) 22 - 32 mmol/L   Glucose, Bld 179 (H) 70 - 99 mg/dL   BUN 7 6 - 20 mg/dL   Creatinine, Ser 0.55 0.44 - 1.00 mg/dL   Calcium 9.0 8.9 - 10.3 mg/dL   Total Protein 6.6 6.5 - 8.1 g/dL   Albumin 2.7 (L) 3.5 - 5.0 g/dL   AST 13 (L) 15 - 41 U/L   ALT 8 0 - 44 U/L   Alkaline Phosphatase 87 38 - 126 U/L   Total Bilirubin 1.2 0.3 - 1.2 mg/dL   GFR calc non Af Amer >60 >60 mL/min   GFR calc Af Amer >60 >60 mL/min    Comment: (NOTE) The eGFR has been calculated using the CKD EPI equation. This calculation has not been validated in all clinical situations. eGFR's persistently <60 mL/min signify possible Chronic Kidney Disease.    Anion gap 12 5 - 15    Comment: Performed at [place] [address] [place], [place] [address]  CBC with Differential/Platelet     Status: Abnormal   Collection Time: 01/18/18 11:05 AM  Result Value Ref Range   WBC 10.7 (H) 4.0 - 10.5 K/uL   RBC 5.07 3.87 - 5.11 MIL/uL   Hemoglobin 15.3 (H) 12.0 - 15.0 g/dL   HCT 44.3 36.0 - 46.0 %   MCV 87.4 80.0 - 100.0 fL   MCH 30.2 26.0 - 34.0 pg   MCHC 34.5 30.0 - 36.0 g/dL   RDW 12.9 11.5 - 15.5 %   Platelets 255 150 - 400 K/uL   nRBC 0.0 0.0 - 0.2 %   Neutrophils Relative % 66 %   Neutro Abs 7.2 1.7 - 7.7 K/uL   Lymphocytes Relative 24 %   Lymphs Abs 2.5 0.7 - 4.0 K/uL   Monocytes Relative 9 %   Monocytes Absolute 0.9 0.1 - 1.0 K/uL   Eosinophils Relative 1 %   Eosinophils Absolute 0.1 0.0 - 0.5 K/uL   Basophils Relative 0 %   Basophils Absolute 0.0 0.0 - 0.1 K/uL   Immature Granulocytes 0 %   Abs Immature Granulocytes  0.03 0.00 -  0.07 K/uL    Comment: Performed at [facility], [place] [address] [place], [place] [address]  Lipase, blood     Status: None   Collection Time: 01/18/18 11:05 AM  Result Value Ref Range   Lipase 28 11 - 51 U/L    Comment: Performed at [facility], [place] [address] [place], [place] [address]  Urine Culture     Status: Abnormal   Collection Time: 01/18/18 11:06 AM  Result Value Ref Range   Specimen Description URINE, CLEAN CATCH    Special Requests      Normal Performed at [facility], [place] [address] [place], [place] [address]    Culture >=100,000 COLONIES/mL ESCHERICHIA COLI (A)    Report Status 01/20/2018 FINAL    Organism ID, Bacteria ESCHERICHIA COLI (A)       Susceptibility   Escherichia coli - MIC*    AMPICILLIN <=2 SENSITIVE Sensitive     CEFAZOLIN <=4 SENSITIVE Sensitive     CEFTRIAXONE <=1 SENSITIVE Sensitive     CIPROFLOXACIN <=0.25 SENSITIVE Sensitive     GENTAMICIN <=1 SENSITIVE Sensitive     IMIPENEM <=0.25 SENSITIVE Sensitive     NITROFURANTOIN <=16 SENSITIVE Sensitive     TRIMETH/SULFA <=20 SENSITIVE Sensitive     AMPICILLIN/SULBACTAM <=2 SENSITIVE Sensitive     PIP/TAZO <=4 SENSITIVE Sensitive     Extended ESBL NEGATIVE Sensitive     * >=100,000 COLONIES/mL ESCHERICHIA COLI  Protein / creatinine ratio, urine     Status: Abnormal   Collection Time: 01/25/18  6:57 AM  Result Value Ref Range   Creatinine, Urine 191.00 mg/dL   Total Protein, Urine 44 mg/dL    Comment: NO NORMAL RANGE ESTABLISHED FOR THIS TEST   Protein Creatinine Ratio 0.23 (H) 0.00 - 0.15 mg/mg[Cre]    Comment: Performed at [facility], [address] [place], [place] [address]  CBC     Status: Abnormal   Collection Time: 01/25/18  7:09 AM  Result Value Ref Range   WBC 11.3 (H) 4.0 - 10.5 K/uL   RBC 4.48 3.87 - 5.11 MIL/uL   Hemoglobin 14.0 12.0 - 15.0 g/dL   HCT 39.4 36.0 - 46.0 %   MCV 87.9 80.0 - 100.0 fL   MCH 31.3 26.0 - 34.0 pg   MCHC 35.5 30.0 - 36.0 g/dL    RDW 13.4 11.5 - 15.5 %   Platelets 219 150 - 400 K/uL   nRBC 0.0 0.0 - 0.2 %    Comment: Performed at [facility] [address], [place], [place] [address]  Comprehensive metabolic panel     Status: Abnormal   Collection Time: 01/25/18  7:09 AM  Result Value Ref Range   Sodium 131 (L) 135 - 145 mmol/L   Potassium 3.5 3.5 - 5.1 mmol/L   Chloride 103 98 - 111 mmol/L   CO2 18 (L) 22 - 32 mmol/L   Glucose, Bld 208 (H) 70 - 99 mg/dL   BUN 9 6 - 20 mg/dL   Creatinine, Ser 0.44 0.44 - 1.00 mg/dL   Calcium 8.5 (L) 8.9 - 10.3 mg/dL   Total Protein 6.2 (L) 6.5 - 8.1 g/dL   Albumin 2.6 (L) 3.5 - 5.0 g/dL   AST 14 (L) 15 - 41 U/L   ALT 10 0 - 44 U/L   Alkaline Phosphatase 83 38 - 126 U/L   Total Bilirubin 0.8 0.3 - 1.2 mg/dL   GFR calc non  Af Amer >60 >60 mL/min   GFR calc Af Amer >60 >60 mL/min    Comment: (NOTE) The eGFR has been calculated using the CKD EPI equation. This calculation has not been validated in all clinical situations. eGFR's persistently <60 mL/min signify possible Chronic Kidney Disease.    Anion gap 10 5 - 15    Comment: Performed at [facility], [address], [place], [place] [address]  Type and screen     Status: None   Collection Time: 01/25/18  7:09 AM  Result Value Ref Range   ABO/RH(D) O POS    Antibody Screen NEG    Sample Expiration      01/28/2018 Performed at [facility], [address], [place], [place] [address]   Lipase, blood     Status: None   Collection Time: 01/25/18  7:09 AM  Result Value Ref Range   Lipase 23 11 - 51 U/L    Comment: Performed at [facility], [address] [place], [place] [address]  ABO/Rh     Status: None   Collection Time: 01/25/18  7:09 AM  Result Value Ref Range   ABO/RH(D)      O POS Performed at [facility], [address] [place], [place] [address]   Urinalysis, Routine w reflex microscopic     Status: Abnormal   Collection Time: 01/25/18  7:28 AM  Result Value Ref Range   Color,  Urine AMBER (A) YELLOW    Comment: BIOCHEMICALS MAY BE AFFECTED BY COLOR   APPearance CLOUDY (A) CLEAR   Specific Gravity, Urine 1.026 1.005 - 1.030   pH 5.0 5.0 - 8.0   Glucose, UA >=500 (A) NEGATIVE mg/dL   Hgb urine dipstick LARGE (A) NEGATIVE   Bilirubin Urine NEGATIVE NEGATIVE   Ketones, ur 20 (A) NEGATIVE mg/dL   Protein, ur 30 (A) NEGATIVE mg/dL   Nitrite NEGATIVE NEGATIVE   Leukocytes, UA NEGATIVE NEGATIVE   RBC / HPF >50 (H) 0 - 5 RBC/hpf   WBC, UA >50 (H) 0 - 5 WBC/hpf   Bacteria, UA MANY (A) NONE SEEN   Mucus PRESENT     Comment: Performed at [facility], [place], [place] [address]  Group B strep by PCR     Status: Abnormal   Collection Time: 01/25/18  8:32 AM  Result Value Ref Range   Group B strep by PCR POSITIVE (A) NEGATIVE    Comment: RESULT CALLED TO, READ BACK BY AND VERIFIED WITH: SHARON GRINDSTAFF 1340 01/25/18 BY A POTEAT (NOTE) Intrapartum testing with Xpert GBS assay should be used as an adjunct to other methods available and not used to replace antepartum testing (at 35-[redacted] weeks gestation). Performed at [facility], [address] [place] [address], [place], [place] [address]   Glucose, capillary     Status: Abnormal   Collection Time: 01/25/18  8:57 AM  Result Value Ref Range   Glucose-Capillary 191 (H) 70 - 99 mg/dL  Urine rapid drug screen (hosp performed)     Status: Abnormal   Collection Time: 01/25/18 10:20 AM  Result Value Ref Range   Opiates NONE DETECTED NONE DETECTED   Cocaine NONE DETECTED NONE DETECTED   Benzodiazepines NONE DETECTED NONE DETECTED   Amphetamines POSITIVE (A) NONE DETECTED   Tetrahydrocannabinol POSITIVE (A) NONE DETECTED   Barbiturates NONE DETECTED NONE DETECTED    Comment: (NOTE) DRUG SCREEN FOR MEDICAL PURPOSES ONLY.  IF CONFIRMATION IS NEEDED FOR ANY PURPOSE, NOTIFY LAB WITHIN 5 DAYS. LOWEST DETECTABLE LIMITS  FOR URINE DRUG SCREEN Drug Class                     Cutoff (ng/mL) Amphetamine and  metabolites    1000 Barbiturate and metabolites    200 Benzodiazepine                 297 Tricyclics and metabolites     300 Opiates and metabolites        300 Cocaine and metabolites        300 THC                            50 Performed at [facility], [address] [place], [place] [address]   Glucose, capillary     Status: Abnormal   Collection Time: 01/25/18 11:56 AM  Result Value Ref Range   Glucose-Capillary 194 (H) 70 - 99 mg/dL     Assessment: IUP at 31+1 Di/Di twins NICHD Cat 1 tracing UC q 1-3 minutes SVE 2/80/ballotable  Plan: Admit to [place] per consult with Dr. Nelda Marseille Continuous fetal monitoring Abdominal u/s labs Rule out labor vs. acute abdomen   Altha Harm, CNM,  01/25/2018, 12:25 PM

## 2018-01-25 NOTE — MAU Provider Note (Signed)
Chief Complaint:  Abdominal Pain   None     HPI: Ellen KaufmannJennifer Martin is a 32 y.o. [id number] at 378w1d with di/di twins pregnancy, Type 2 DM on insulin, who presents to maternity admissions via EMS with report of painful contractions starting 2 hours before arrival.  She is moaning in pain when arrived in MAU. She reports pain in her right upper abdomen radiating across her entire upper abdomen. She denies pain in her lower abdomen or back.  She last ate a [employer] hamburger at 7 pm.  She denies diarrhea or constipation.  She has not tried any treatments. There are no other symptoms.  She reports good fetal movement, denies LOF, vaginal bleeding, vaginal itching/burning, urinary symptoms, h/a, dizziness, n/v, or fever/chills.    HPI  Past Medical History: Past Medical History:  Diagnosis Date  . Diabetes mellitus without complication (HCC)    Type 2  . Hypertension     Past obstetric history: OB History  Gravida Para Term Preterm AB Living  7 3 3   3 3   SAB TAB Ectopic Multiple Live Births  3            # Outcome Date GA Lbr Len/2nd Weight Sex Delivery Anes PTL Lv  7 Current           6 SAB           5 SAB           4 SAB           3 Term      Vag-Spont     2 Term      Vag-Spont     1 Term      Vag-Spont       Past Surgical History: Past Surgical History:  Procedure Laterality Date  . NO PAST SURGERIES      Family History: Family History  Problem Relation Age of Onset  . Hypertension Mother   . Diabetes Mother   . Hypertension Maternal Grandmother   . Diabetes Maternal Grandmother     Social History: Social History   Tobacco Use  . Smoking status: Current Every Day Smoker    Packs/day: 0.25  . Smokeless tobacco: Never Used  Substance Use Topics  . Alcohol use: Not Currently  . Drug use: Not Currently    Allergies: No Known Allergies  Meds:  Medications Prior to Admission  Medication Sig Dispense Refill Last Dose  . Acetaminophen (TYLENOL PO) Take by mouth.    Not Taking  . amoxicillin (AMOXIL) 500 MG capsule Take 1 capsule (500 mg total) by mouth 3 (three) times daily. 21 capsule 0   . insulin NPH Human (HUMULIN N,NOVOLIN N) 100 UNIT/ML injection Inject into the skin.   Not Taking  . insulin regular (NOVOLIN R,HUMULIN R) 100 units/mL injection Inject into the skin 3 (three) times daily before meals.   Taking  . metoCLOPramide (REGLAN) 10 MG tablet Take 1 tablet (10 mg total) by mouth every 6 (six) hours as needed for nausea (nausea/headache). 8 tablet 0   . Prenatal Vit-Fe Fumarate-FA (PRENATAL VITAMIN PO) Take by mouth.   Taking    ROS:  Review of Systems  Constitutional: Negative for chills, fatigue and fever.  Eyes: Negative for visual disturbance.  Respiratory: Negative for shortness of breath.   Cardiovascular: Negative for chest pain.  Gastrointestinal: Positive for abdominal pain. Negative for nausea and vomiting.  Genitourinary: Positive for pelvic pain. Negative for difficulty urinating, dysuria, flank pain, vaginal  bleeding, vaginal discharge and vaginal pain.  Neurological: Negative for dizziness and headaches.  Psychiatric/Behavioral: Negative.      I have reviewed patient's Past Medical Hx, Surgical Hx, Family Hx, Social Hx, medications and allergies.   Physical Exam   Patient Vitals for the past 24 hrs:  BP Temp Temp src Pulse Resp SpO2  01/25/18 0846 (!) 159/87 - - 98 - -  01/25/18 0844 - (!) (P) 97.5 F (36.4 C) (P) Oral - - -  01/25/18 0828 128/79 - - 94 - -  01/25/18 0750 109/76 - - 93 - -  01/25/18 0733 124/89 - - (!) 110 - -  01/25/18 0700 (!) 129/106 - - 93 - -  01/25/18 0635 (!) 147/84 - - (!) 102 - -  01/25/18 0619 (!) 148/107 (!) 97.4 F (36.3 C) - (!) 102 (!) 26 99 %   Constitutional: Well-developed, well-nourished female in moderate distress.  HEART: normal rate, heart sounds, regular rhythm RESP: normal effort, lung sounds clear and equal bilaterally GI: Abd soft, non-tender, gravid appropriate for  gestational age.  MS: Extremities nontender, no edema, normal ROM Neurologic: Alert and oriented x 4.  GU: Neg CVAT.   Dilation: 4 Effacement (%): 50 Cervical Position: Posterior Station: Ballotable Exam by:: leftwich kirby cnm  FHT Baby A:  Baseline 155 , moderate variability, intermittent tracing due to maternal position/body habitus FHT Baby B:  Baseline 155 , moderate variability,  intermittent tracing due to maternal position/body habitus Contractions: irregular, every 1-10 min, mild to palpaption   Labs: Results for orders placed or performed during the hospital encounter of 01/25/18 (from the past 24 hour(s))  Protein / creatinine ratio, urine     Status: Abnormal   Collection Time: 01/25/18  6:57 AM  Result Value Ref Range   Creatinine, Urine 191.00 mg/dL   Total Protein, Urine 44 mg/dL   Protein Creatinine Ratio 0.23 (H) 0.00 - 0.15 mg/mg[Cre]  CBC     Status: Abnormal   Collection Time: 01/25/18  7:09 AM  Result Value Ref Range   WBC 11.3 (H) 4.0 - 10.5 K/uL   RBC 4.48 3.87 - 5.11 MIL/uL   Hemoglobin 14.0 12.0 - 15.0 g/dL   HCT 16.1 09.6 - 04.5 %   MCV 87.9 80.0 - 100.0 fL   MCH 31.3 26.0 - 34.0 pg   MCHC 35.5 30.0 - 36.0 g/dL   RDW 40.9 81.1 - 91.4 %   Platelets 219 150 - 400 K/uL   nRBC 0.0 0.0 - 0.2 %  Comprehensive metabolic panel     Status: Abnormal   Collection Time: 01/25/18  7:09 AM  Result Value Ref Range   Sodium 131 (L) 135 - 145 mmol/L   Potassium 3.5 3.5 - 5.1 mmol/L   Chloride 103 98 - 111 mmol/L   CO2 18 (L) 22 - 32 mmol/L   Glucose, Bld 208 (H) 70 - 99 mg/dL   BUN 9 6 - 20 mg/dL   Creatinine, Ser 7.82 0.44 - 1.00 mg/dL   Calcium 8.5 (L) 8.9 - 10.3 mg/dL   Total Protein 6.2 (L) 6.5 - 8.1 g/dL   Albumin 2.6 (L) 3.5 - 5.0 g/dL   AST 14 (L) 15 - 41 U/L   ALT 10 0 - 44 U/L   Alkaline Phosphatase 83 38 - 126 U/L   Total Bilirubin 0.8 0.3 - 1.2 mg/dL   GFR calc non Af Amer >60 >60 mL/min   GFR calc Af Amer >60 >60 mL/min  Anion gap 10 5 -  15  Type and screen     Status: None   Collection Time: 01/25/18  7:09 AM  Result Value Ref Range   ABO/RH(D) O POS    Antibody Screen NEG    Sample Expiration      01/28/2018 Performed at [facility], [address], [place], [place] [address]   Lipase, blood     Status: None   Collection Time: 01/25/18  7:09 AM  Result Value Ref Range   Lipase 23 11 - 51 U/L  Urinalysis, Routine w reflex microscopic     Status: Abnormal   Collection Time: 01/25/18  7:28 AM  Result Value Ref Range   Color, Urine AMBER (A) YELLOW   APPearance CLOUDY (A) CLEAR   Specific Gravity, Urine 1.026 1.005 - 1.030   pH 5.0 5.0 - 8.0   Glucose, UA >=500 (A) NEGATIVE mg/dL   Hgb urine dipstick LARGE (A) NEGATIVE   Bilirubin Urine NEGATIVE NEGATIVE   Ketones, ur 20 (A) NEGATIVE mg/dL   Protein, ur 30 (A) NEGATIVE mg/dL   Nitrite NEGATIVE NEGATIVE   Leukocytes, UA NEGATIVE NEGATIVE   RBC / HPF >50 (H) 0 - 5 RBC/hpf   WBC, UA >50 (H) 0 - 5 WBC/hpf   Bacteria, UA MANY (A) NONE SEEN   Mucus PRESENT    --/--/O POS (11/23 6045)  Imaging:  No results found.  MAU Course/MDM: Pt pain is upper abdomen, not c/w preterm labor CBC, CMP, lipase type and screen ordered NST reviewed and reactive x 2 Cervix 3/70/-3 on initial exam, breech position noted on bedside US IV fluids, BMZ x 1, Fentanyl 100 mcg x 1 dose given with some relief Recheck cervix in 1.5 hours and changed to 4/50/-3, breech Pain continues to be upper abdomen, mostly right side Called Dr Charlotta Newton for admission Admit to [facility] for observation, magnesium, continue BMZ   Today's evaluation included a work-up for preterm labor which can be life-threatening for both mom and baby.  Assessment: 1. Continuous RUQ abdominal pain   2. Dichorionic diamniotic twin pregnancy in third trimester   3. Abdominal pain during pregnancy, third trimester   4. Type 2 diabetes mellitus affecting pregnancy in third trimester, antepartum      Plan[facility] Charlotta Newton to see pt    Sharen Counter Certified Nurse-Midwife 01/25/2018 8:46 AM

## 2018-01-25 NOTE — Progress Notes (Signed)
S WUJWJXBJYNGrindstaff RN, Verdie DrownJ Hazelwood RN and Cleone SlimH Mitchell RN still attempting to trace both infants

## 2018-01-25 NOTE — Progress Notes (Signed)
Subjective: Pt still uncomfortable.  Requesting more pain medication.  States does not feel like labor.  More under rib pain.  Pt did eat a burger and onion rings late yesterday.    Objective: BP (!) 144/83   Pulse 82   Temp (!) 97.5 F (36.4 C) (Oral)   Resp 20   Ht 5\' 9"  (1.753 m)   Wt 128.4 kg   LMP 07/12/2017   SpO2 99%   BMI 41.79 kg/m  No intake/output data recorded. Total I/O In: 1426.4 [P.O.:100; I.V.:1326.4] Out: 125 [Urine:125] US Abdomen Limited RUQ (Accession [id number]) (Order [id number])  Imaging  Date: 01/25/2018 [facility] Released By/Authorizing: Myna Hidalgozan, Bonniejean, DO (auto-released)   CLINICAL DATA:  Right upper quadrant abdominal pain.  EXAM: ULTRASOUND ABDOMEN LIMITED RIGHT UPPER QUADRANT  COMPARISON:  None.  FINDINGS: Gallbladder:  Large stone within the gallbladder lumen. Sludge within the gallbladder lumen. No wall thickening or pericholecystic fluid. Mild pain with scanning within the right upper quadrant.  Common bile duct:  Diameter: 4 mm  Liver:  No focal lesion identified. Within normal limits in parenchymal echogenicity. Portal vein is patent on color Doppler imaging with normal direction of blood flow towards the liver.  IMPRESSION: Cholelithiasis and sludge without secondary signs to suggest acute cholecystitis.   Electronically Signed   By: Annia Beltrew  Davis M.D.   On: 01/25/2018 10:48         External Result Report   Results for orders placed or performed during the hospital encounter of 01/25/18 (from the past 24 hour(s))  Protein / creatinine ratio, urine     Status: Abnormal   Collection Time: 01/25/18  6:57 AM  Result Value Ref Range   Creatinine, Urine 191.00 mg/dL   Total Protein, Urine 44 mg/dL   Protein Creatinine Ratio 0.23 (H) 0.00 - 0.15 mg/mg[Cre]  CBC     Status: Abnormal   Collection Time: 01/25/18  7:09 AM  Result Value Ref Range   WBC 11.3 (H)  4.0 - 10.5 K/uL   RBC 4.48 3.87 - 5.11 MIL/uL   Hemoglobin 14.0 12.0 - 15.0 g/dL   HCT 28.439.4 13.236.0 - 44.046.0 %   MCV 87.9 80.0 - 100.0 fL   MCH 31.3 26.0 - 34.0 pg   MCHC 35.5 30.0 - 36.0 g/dL   RDW 10.213.4 72.511.5 - 36.615.5 %   Platelets 219 150 - 400 K/uL   nRBC 0.0 0.0 - 0.2 %  Comprehensive metabolic panel     Status: Abnormal   Collection Time: 01/25/18  7:09 AM  Result Value Ref Range   Sodium 131 (L) 135 - 145 mmol/L   Potassium 3.5 3.5 - 5.1 mmol/L   Chloride 103 98 - 111 mmol/L   CO2 18 (L) 22 - 32 mmol/L   Glucose, Bld 208 (H) 70 - 99 mg/dL   BUN 9 6 - 20 mg/dL   Creatinine, Ser 4.400.44 0.44 - 1.00 mg/dL   Calcium 8.5 (L) 8.9 - 10.3 mg/dL   Total Protein 6.2 (L) 6.5 - 8.1 g/dL   Albumin 2.6 (L) 3.5 - 5.0 g/dL   AST 14 (L) 15 - 41 U/L   ALT 10 0 - 44 U/L   Alkaline Phosphatase 83 38 - 126 U/L   Total Bilirubin 0.8 0.3 - 1.2 mg/dL   GFR calc non Af Amer >60 >60 mL/min   GFR calc Af Amer >60 >60 mL/min   Anion gap 10 5 - 15  Type and screen  Status: None   Collection Time: 01/25/18  7:09 AM  Result Value Ref Range   ABO/RH(D) O POS    Antibody Screen NEG    Sample Expiration      01/28/2018 Performed at [facility], [address], [place], [place] [address]   Lipase, blood     Status: None   Collection Time: 01/25/18  7:09 AM  Result Value Ref Range   Lipase 23 11 - 51 U/L  Urinalysis, Routine w reflex microscopic     Status: Abnormal   Collection Time: 01/25/18  7:28 AM  Result Value Ref Range   Color, Urine AMBER (A) YELLOW   APPearance CLOUDY (A) CLEAR   Specific Gravity, Urine 1.026 1.005 - 1.030   pH 5.0 5.0 - 8.0   Glucose, UA >=500 (A) NEGATIVE mg/dL   Hgb urine dipstick LARGE (A) NEGATIVE   Bilirubin Urine NEGATIVE NEGATIVE   Ketones, ur 20 (A) NEGATIVE mg/dL   Protein, ur 30 (A) NEGATIVE mg/dL   Nitrite NEGATIVE NEGATIVE   Leukocytes, UA NEGATIVE NEGATIVE   RBC / HPF >50 (H) 0 - 5 RBC/hpf   WBC, UA >50 (H) 0 - 5 WBC/hpf   Bacteria, UA MANY (A) NONE  SEEN   Mucus PRESENT   Glucose, capillary     Status: Abnormal   Collection Time: 01/25/18  8:57 AM  Result Value Ref Range   Glucose-Capillary 191 (H) 70 - 99 mg/dL    FHT: Category 1 Twin A 140 accels no decels, Twin B FHT 130 accels no decels UC:   occassional SVE:   Dilation: 2 Effacement (%): 80 Station: Ballotable Exam by:: [employer] CNM   Assessment:  Pt is a [id number] at 31.1 IUP twin gestation with upper abdominal pain Twins Cholelithiasis and sludge without secondary  Cat 1 strip   Plan: Monitor pain. Recheck cervix at 12 GI consult  Kenney Houseman CNM, MSN 01/25/2018, 11:42 AM

## 2018-01-25 NOTE — Progress Notes (Signed)
S ZOXWRUEAVWGrindstaff RN and Verdie DrownJ [place] RN still attempting to adjust monitors to trace each twen.  M Early RN in room to helap monitor infants

## 2018-01-25 NOTE — Progress Notes (Signed)
Ultrasound here for ordered u/s

## 2018-01-25 NOTE — Progress Notes (Signed)
Hospital day # 0 pregnancy at 60100w1d--twins, type 2 diabetes on insulin, gallstones.  S:  Pt states in pain.  Requesting pain medication.  Pt eating dinner.        Perception of contractions: none      Vaginal bleeding: none now       Vaginal discharge: N/A  O: BP (!) 142/83 (BP Location: Right Arm)   Pulse 78   Temp 98.4 F (36.9 C) (Oral)   Resp 18   Ht 5\' 9"  (1.753 m)   Wt 128.4 kg   LMP 07/12/2017   SpO2 100%   BMI 41.79 kg/m       Fetal tracings:NST TID      Contractions:   None      Uterus gravid and non-tender      Extremities: extremities normal, atraumatic, no cyanosis or edema and no significant edema and no signs of DVT          Labs:  See results       Meds: sliding scale insulin  A: 46100w1d with twins, type 2 diabetes on insulin, gallstones.     stable  P: Continue current plan of care     Discussed patient with GI MD on call.  He states if continues to have problems to call for surgical consult.  Otherwise manage pain and vomiting with appropriate medications used in pregnancy, fentanyl or diluadid      MDs will follow  Kenney HousemanNancy Jean Prothero CNM, MSN 01/25/2018 5:40 PM

## 2018-01-26 LAB — GLUCOSE, CAPILLARY
Glucose-Capillary: 197 mg/dL — ABNORMAL HIGH (ref 70–99)
Glucose-Capillary: 180 mg/dL — ABNORMAL HIGH (ref 70–99)
Glucose-Capillary: 179 mg/dL — ABNORMAL HIGH (ref 70–99)

## 2018-01-26 MED ORDER — INSULIN NPH (HUMAN) (ISOPHANE) 100 UNIT/ML ~~LOC~~ SUSP
15.0000 [IU] | Freq: Two times a day (BID) | SUBCUTANEOUS | Status: DC
  Administered 2018-01-26 – 2018-01-27 (×2): 15 [IU] via SUBCUTANEOUS
  Filled 2018-01-26: qty 10

## 2018-01-26 MED ORDER — ONDANSETRON HCL 4 MG/2ML IJ SOLN
4.0000 mg | Freq: Four times a day (QID) | INTRAMUSCULAR | Status: DC | PRN
  Administered 2018-01-26 – 2018-01-27 (×3): 4 mg via INTRAVENOUS
  Filled 2018-01-26 (×3): qty 2

## 2018-01-26 NOTE — Progress Notes (Signed)
Hospital day # 1 pregnancy at 2621w2d--with Gallstones, abdominal pain, polysubstance abuse and di/di twins.  S:  Pt states her pain is 5/10,some mild nausea.        Perception of contractions: none      Vaginal bleeding: none now       Vaginal discharge:  no significant change  O: BP 135/70 (BP Location: Right Arm)   Pulse 74   Temp 97.7 F (36.5 C) (Oral)   Resp 19   Ht 5\' 9"  (1.753 m)   Wt 128.4 kg   LMP 07/12/2017   SpO2 98%   BMI 41.79 kg/m       Fetal tracings:      Contractions:         Uterus gravid, consistent with 32 weeks and non-tender      Extremities: extremities normal, atraumatic, no cyanosis or edema and no significant edema and no signs of DVT          Labs:  BS 160-191  POS GBS  UDS POS for Amphetamines and THC           Meds: Tylenol, Pepcid, Zofran  A: 6321w2d with Gallstone, IDDM     gradually improving  P: Continue current plan of care      Upcoming tests/treatments:  Consult with Gen Surgery, Diabetic Ed, SW today.   REVIEWED HER DIET PLAN EXTENSIVELY - LOW FAT, LOW CARB, COMPLIANCE  REVIEWED GALLSTONES UNABLE TO BE CONTINUALLY TREATED WITH NARCOTICS      MDs will follow  Altamese Cabalrin M Atticus Wedin CNM, MSN 01/26/2018 8:02 AM

## 2018-01-26 NOTE — Progress Notes (Addendum)
Inpatient Diabetes Program Recommendations  AACE/ADA: New Consensus Statement on Inpatient Glycemic Control (2015)  Target Ranges:  Prepandial:   less than 140 mg/dL      Peak postprandial:   less than 180 mg/dL (1-2 hours)      Critically ill patients:  140 - 180 mg/dL   Lab Results  Component Value Date   GLUCAP 197 (H) 01/26/2018    Review of Glycemic Control  Diabetes history: type 2 Outpatient Diabetes medications: NPH?, Novolog 10 units TID Current orders for Inpatient glycemic control: Novolog 0-20 units four times per day  Inpatient Diabetes Program Recommendations:   Received diabetes coordinator consult. Noted that patient has only been on Novolog 10 unitsTID at home.   According to insulin requirements for patient at approximately 32 weeks and 128.4 kg:  Recommend NPH 15 units every am and HS and Novolog 0-14 units sliding scale per diabetic pregnant patient order set with fasting and 2 hour postprandial blood sugars. Noted that patient is only on clear liquid diet at this time. Discontinue Novolog 0-20 units correction scale as ordered if replacing orders per recommendations.  If patient is to be induced, recommend starting glucostabilizer for glucose control.    Smith MinceKendra Jalah Warmuth RN BSN CDE Diabetes Coordinator Pager: [phone]  8am-5pm

## 2018-01-26 NOTE — Progress Notes (Signed)
CSW acknowledges consult.  CSW attempted to meet with MOB, however MOB was asleep.  CSW will attempt to visit with MOB at a later time.   Mykia Holton Boyd-Gilyard, MSW, LCSW Clinical Social Work [phone]  

## 2018-01-27 ENCOUNTER — Other Ambulatory Visit: Payer: Self-pay

## 2018-01-27 ENCOUNTER — Inpatient Hospital Stay (HOSPITAL_COMMUNITY): Payer: Medicaid Other

## 2018-01-27 ENCOUNTER — Inpatient Hospital Stay (HOSPITAL_BASED_OUTPATIENT_CLINIC_OR_DEPARTMENT_OTHER): Payer: Medicaid Other

## 2018-01-27 DIAGNOSIS — O24112 Pre-existing diabetes mellitus, type 2, in pregnancy, second trimester: Secondary | ICD-10-CM

## 2018-01-27 DIAGNOSIS — O99212 Obesity complicating pregnancy, second trimester: Secondary | ICD-10-CM

## 2018-01-27 DIAGNOSIS — O289 Unspecified abnormal findings on antenatal screening of mother: Secondary | ICD-10-CM

## 2018-01-27 DIAGNOSIS — Z794 Long term (current) use of insulin: Secondary | ICD-10-CM

## 2018-01-27 DIAGNOSIS — O30042 Twin pregnancy, dichorionic/diamniotic, second trimester: Secondary | ICD-10-CM

## 2018-01-27 DIAGNOSIS — O24913 Unspecified diabetes mellitus in pregnancy, third trimester: Secondary | ICD-10-CM

## 2018-01-27 DIAGNOSIS — O30043 Twin pregnancy, dichorionic/diamniotic, third trimester: Secondary | ICD-10-CM

## 2018-01-27 DIAGNOSIS — Z3A31 31 weeks gestation of pregnancy: Secondary | ICD-10-CM

## 2018-01-27 LAB — URINALYSIS, ROUTINE W REFLEX MICROSCOPIC
BILIRUBIN URINE: NEGATIVE
GLUCOSE, UA: NEGATIVE mg/dL
HGB URINE DIPSTICK: NEGATIVE
Ketones, ur: NEGATIVE mg/dL
NITRITE: NEGATIVE
PH: 7 (ref 5.0–8.0)
Protein, ur: NEGATIVE mg/dL
SPECIFIC GRAVITY, URINE: 1.018 (ref 1.005–1.030)

## 2018-01-27 LAB — GLUCOSE, CAPILLARY
GLUCOSE-CAPILLARY: 150 mg/dL — AB (ref 70–99)
Glucose-Capillary: 167 mg/dL — ABNORMAL HIGH (ref 70–99)
Glucose-Capillary: 94 mg/dL (ref 70–99)

## 2018-01-27 MED ORDER — INSULIN ASPART 100 UNIT/ML ~~LOC~~ SOLN
0.0000 [IU] | Freq: Three times a day (TID) | SUBCUTANEOUS | Status: DC
  Administered 2018-01-27: 2 [IU] via SUBCUTANEOUS
  Administered 2018-01-27 – 2018-01-28 (×3): 1 [IU] via SUBCUTANEOUS
  Administered 2018-01-29: 2 [IU] via SUBCUTANEOUS
  Administered 2018-01-29 (×2): 1 [IU] via SUBCUTANEOUS

## 2018-01-27 MED ORDER — INSULIN ASPART 100 UNIT/ML ~~LOC~~ SOLN
8.0000 [IU] | Freq: Three times a day (TID) | SUBCUTANEOUS | Status: DC
  Administered 2018-01-27 – 2018-01-30 (×10): 8 [IU] via SUBCUTANEOUS

## 2018-01-27 MED ORDER — CEPHALEXIN 500 MG PO CAPS
500.0000 mg | ORAL_CAPSULE | Freq: Two times a day (BID) | ORAL | Status: DC
  Administered 2018-01-27 – 2018-01-30 (×6): 500 mg via ORAL
  Filled 2018-01-27 (×6): qty 1

## 2018-01-27 MED ORDER — INSULIN NPH (HUMAN) (ISOPHANE) 100 UNIT/ML ~~LOC~~ SUSP: 18.0000 [IU] | Freq: Two times a day (BID) | SUBCUTANEOUS | Status: DC

## 2018-01-27 MED ORDER — INSULIN NPH (HUMAN) (ISOPHANE) 100 UNIT/ML ~~LOC~~ SUSP
20.0000 [IU] | Freq: Two times a day (BID) | SUBCUTANEOUS | Status: DC
  Administered 2018-01-27 – 2018-01-30 (×6): 20 [IU] via SUBCUTANEOUS
  Filled 2018-01-27: qty 10

## 2018-01-27 NOTE — Progress Notes (Signed)
CSW attempted to follow up and see patient to complete assessment. Bedside RN currently providing patient care and requested that CSW try again at a later time. CSW will try to see patient at a later time.  Ellen Martin, LCSWA Clinical Social Worker [facility] Cell#: [phone]

## 2018-01-27 NOTE — Progress Notes (Signed)
Initial Nutrition Assessment  DOCUMENTATION CODES:  Morbid obesity  INTERVENTION:  CHO modified gestational diet, low fat  NUTRITION DIAGNOSIS:  Increased nutrient needs related to (twin pregnancy/ fetal growth requirements) as evidenced by (31 weeks twin preg). GOAL:  Patient will meet greater than or equal to 90% of their needs  MONITOR:  Labs  REASON FOR ASSESSMENT:  Gestational Diabetes, Antenatal   ASSESSMENT:  31 3/7 weeks twin preg, DM II, gallstones. No pre-preg weight available, weight Sept 11th same as adm weight. Pt aware of need for CHO mod, low fat diet. Provided pt with handout explaining GDM diet, with all high fat foods crossed off. Reviwed diet for gallstones with pt verbally. Pt with limited interest in extended review of diet. Did not want further explaination of GDM diet or CHO counting.   Diet Order:   Diet Order            Diet gestational carb mod Fluid consistency: Thin; Room service appropriate? Yes  Diet effective now            EDUCATION NEEDS:  Education needs have been addressed  Skin:  Skin Assessment: Reviewed RN Assessment Height:   Ht Readings from Last 1 Encounters:  01/25/18 5\' 9"  (1.753 m)   Weight:   Wt Readings from Last 1 Encounters:  01/25/18 128.4 kg  Ideal Body Weight:   145 lbs BMI:  Body mass index is 41.79 kg/m. Estimated Nutritional Needs:  Kcal:  2600-2800 Protein:  115-125 g Fluid:  2.8 L   Ellen Martin M.Odis LusterEd. R.D. LDN Neonatal Nutrition Support Specialist/RD III Pager [phone]      Phone [phone]

## 2018-01-27 NOTE — Progress Notes (Addendum)
Inpatient Diabetes Program Recommendations  AACE/ADA: New Consensus Statement on Inpatient Glycemic Control (2015)  Target Ranges:  Prepandial:   less than 140 mg/dL      Peak postprandial:   less than 180 mg/dL (1-2 hours)      Critically ill patients:  140 - 180 mg/dL   Lab Results  Component Value Date   GLUCAP 179 (H) 01/26/2018    Review of Glycemic Control Results for Ellen KaufmannBOLIN, Ellen Martin (MRN [id number]) as of 01/27/2018 09:07  Ref. Range 01/26/2018 10:32 01/26/2018 16:16 01/26/2018 21:17  Glucose-Capillary Latest Ref Range: 70 - 99 mg/dL 409197 (H) 811180 (H) 914179 (H)   Diabetes history: type 2 Outpatient Diabetes medications: Novolog 10 units TID (per note has not been taking since 17 weeks) Current orders for Inpatient glycemic control: NPH 15 units BID, Novolog 0-20 units four times per day  Inpatient Diabetes Program Recommendations:    Received diabetes coordinator consult. Noted that patient has only been on Novolog 10 unitsTID at home.  Noted administration of BMZ x2, contributing to increased blood glucose trends.   RN unable to collect FSBS this AM following first admin dose of NPH.  At this time, recommend adding: - Novolog 0-14 units TID (after meals) sliding scale per diabetic pregnant patient order set/ - Meal coverage: Novolog 8 units TID (assuming that patient is consuming >50% of meals).  - Increasing NPH to 18 units BID.  Discussed with Kathalene FramesEllis Greer, CNM regarding plan of care. Patient has had meter ordered in the past and has never picked it up from pharmacy. Difficulty complying with plan and medications. Glucose meter (includes lancets and strips) ([id number]) Will place CM consult to help assist with patient needs for follow up.   Thanks, Lujean RaveLauren Dyer Klug, MSN, RNC-OB Diabetes Coordinator [phone] (8a-5p)

## 2018-01-27 NOTE — Progress Notes (Addendum)
Ellen Martin is a 32 y.o. female patient.  1. Continuous RUQ abdominal pain   2. Dichorionic diamniotic twin pregnancy in third trimester   3. Abdominal pain during pregnancy, third trimester   4. Type 2 diabetes mellitus affecting pregnancy in third trimester, antepartum   5. Right upper quadrant pain   6. Diabetes type 2, uncontrolled (HCC)   7. Diabetes type 2, uncontrolled (HCC)     Past Medical History:  Diagnosis Date  . Diabetes mellitus without complication (HCC)    Type 2  . Hypertension     Current Facility-Administered Medications  Medication Dose Route Frequency Provider Last Rate Last Dose  . acetaminophen (TYLENOL) tablet 650 mg  650 mg Oral Q4H PRN Kenney Houseman, CNM   650 mg at 01/26/18 1929  . calcium carbonate (TUMS - dosed in mg elemental calcium) chewable tablet 400 mg of elemental calcium  2 tablet Oral Q4H PRN Prothero, Henderson Newcomer, CNM      . docusate sodium (COLACE) capsule 100 mg  100 mg Oral Daily Kenney Houseman, CNM   100 mg at 01/26/18 1045  . famotidine (PEPCID) tablet 20 mg  20 mg Oral BID Kenney Houseman, CNM   20 mg at 01/26/18 2157  . insulin aspart (novoLOG) injection 0-20 Units  0-20 Units Subcutaneous QID Janeece Riggers, CNM   4 Units at 01/26/18 2126  . insulin NPH Human (HUMULIN N,NOVOLIN N) injection 15 Units  15 Units Subcutaneous BID AC & HS Altamese Cabal, [place]   15 Units at 01/27/18 0754  . ondansetron (ZOFRAN) injection 4 mg  4 mg Intravenous Q6H PRN Altamese Cabal, CNM   4 mg at 01/26/18 1648  . ondansetron (ZOFRAN-ODT) disintegrating tablet 4 mg  4 mg Oral Once Prothero, Henderson Newcomer, CNM       No Known Allergies Active Problems:   Dichorionic diamniotic twin pregnancy in third trimester  Subjective Patient states she feels better today and that her pain is well controlled. I discussed with her the plan for today: social work consult and MFM ultrasound and consult. She understands that she cannot discharge until her blood  sugars are better controlled. Her CBGs are currently elevated due to steroids and likely inadequate insulin control. MFM recommendation on insulin dosages was requested. She had one elevated BP overnight but does not yet meet criteria for gestational hypertension. Will continue to monitor.   Objective:  Vitals:   01/26/18 1619 01/26/18 1929 01/27/18 0256 01/27/18 0851  BP: 130/67 132/72 (!) 151/79 98/62  Pulse: 68 77 80 78  Resp: 19 19 19 16   Temp: 98.8 F (37.1 C) 98.4 F (36.9 C) 98.4 F (36.9 C) 98.5 F (36.9 C)  TempSrc: Oral Oral Oral Oral  SpO2: 99% 98% 96% 99%  Weight:      Height:       Results for orders placed or performed during the hospital encounter of 01/25/18 (from the past 24 hour(s))  Glucose, capillary     Status: Abnormal   Collection Time: 01/26/18 10:32 AM  Result Value Ref Range   Glucose-Capillary 197 (H) 70 - 99 mg/dL   Comment 1 Notify RN    Comment 2 Document in Chart   Glucose, capillary     Status: Abnormal   Collection Time: 01/26/18  4:16 PM  Result Value Ref Range   Glucose-Capillary 180 (H) 70 - 99 mg/dL   Comment 1 Notify RN    Comment 2 Document in Chart  Glucose, capillary     Status: Abnormal   Collection Time: 01/26/18  9:17 PM  Result Value Ref Range   Glucose-Capillary 179 (H) 70 - 99 mg/dL    Assessment/plan: 32 y.o. G7P3 at 5164w3d  Di/di twins Reactive NST x2 MFM US and consult today Social work consult today Increase insulin dosage per MFM   Janeece Riggersllis K Greer 01/27/2018   [facility] and was prescribed amoxicillin. She never filled her prescription or took this medication. Urinalysis and urine culture ordered. Patient will be started on Keflex. Dr. Normand Sloopillard made aware.   Janeece Riggersllis K Greer 01/27/18 4:47 PM      Pt seen and examined.   Will increase NPH to 20 QAM and QHS Pt understands we will plan for CS at 37 weeks or sooner if needed Awaiting SW consult.

## 2018-01-27 NOTE — Progress Notes (Addendum)
Unable to get a tracing of Ellen Martin. Jade Montana,CNM came up to monitor Ellen. Unable to get fetal heart tones. Bedside ultrasound used. HR averaged 138. Dr. Normand Sloopillard notified by CNM.

## 2018-01-27 NOTE — Progress Notes (Signed)
Pt dd not notify staff to get fasting CBG before she had breakfast

## 2018-01-27 NOTE — Progress Notes (Addendum)
Error in charting.

## 2018-01-27 NOTE — Consult Note (Addendum)
Maternal-Fetal Medicine  Name: Ellen Martin MRN: [id number] Requesting Provider: Kathalene Frames, CNM  Ms. Mcmackin, G7 P3 at 31w 3d gestation with twin pregnancy was admitted 2 days ago with uterine contractions and right upper quadrant pain.  Since admission, pain seems to have resolved. RUQ ultrasound showed cholelithiasis without evidence of acute cholecystitis.   After admission, the patient received betamethasone. Patient had a consultation with diabetic educator.  Past medical history is significant for type 2 diabetes that was diagnosed about 5 years ago after delivery of her third child. She takes insulin NPH 15 units in the morning and 15 units at night. Currently, she is on sliding scale insulin with meals. Patient has not had regular prenatal care and I am unable to assess her diabetic control during pregnancy. She has not had ophthalmologic exam to rule out retinopathy. She does not have neuropathy. Serum creatinine levels are normal (no nephropathy).  No history of chronic hypertension or thyroid disorder.  PSH: Nil of note. Allergies: NKDA Social: Smokes about 2 cigarettes daily. No alcohol or drug use. Her partner is [place] and is not the father of her previous children. Family: No history of venous thromboembolism. Gyn: No history of abnormal Pap smears or breast disease.  Prenatal course: On cell-free fetal DNA, fetal fraction was low and aneuploidy screening results could not be obtained. Patient had opted not to have invasive testing. [facility] She had E.coli UTI last week (?treated).  P/E: Patient is comfortably lying on the exam table; not in pain. BP 130-151/67-79 mm Hg. Abd: Soft gravid uterus; no tenderness. NST: reactive x 2.  Labs: Hb 14, Hct 39.4, WBC 11.3, PLT 219, AST 14, ALT 10, creatinine 0.44, electrolytes normal. All fasting and postprandial levels are elevated (fasting 197 and 157  mg/dL).  Ultrasound  Dichorionic-diamniotic twin pregnancy.  Twin A: Maternal right, breech, posterior placenta. Fetal growth is appropriate for gestational age. Amniotic fluid is normal and good fetal activity is seen.   Twin B: Maternal left, breech (initially in the cephalic position), posterior placenta. Fetal growth is appropriate for gestational age. Amniotic fluid is normal and good fetal activity is seen. Signficant findings include a) single umbilical artery, b) slight echogenicity of right kidney. Right kidney, otherwise, looks normal with urinary tract dilation or cysts. Left kidney appears normal.  Growth discordancy: 16% (normal).  I counseled the patient on the following:  Type 2 diabetes: -Emphasized the importance of good control of diabetes to prevent adverse fetal and neonatal outcomes including stillbirth and neonatal complications (respiratory-distress syndrome and NICU stay). -Insulin dosage to be increased and adjusted to normalize blood glucose levels. -Steroids induce hyperglycemia and insulin requirement may fall after 48 to 72 hours. -I recommend inpatient management till reasonable diabetic control is achieved. -Discussed and recommended weekly antenatal testing.  Timing of delivery: If blood glucose levels are well-controlled, patient can be delivered at 38 weeks. If suboptimal control is seen, delivery at 37 weeks is reasoIable.  Twin Pregnancy: We briefly discussed twin pregnancy. Mode of delivery is based on presentations and her obstetrician will discuss with her.  R/o gestational hypertension: She had 2 SBP over 140 mm Hg (patient was also having pain). Frequent blood pressure monitoring should reveal if the patient has gestational hypertension. If pregnancy is complicated by gestational hypertension or preclampsia, I recommend delivery between 67 and 37 weeks.  Single umbilical artery: I counseled the patient that SUA is seen in about  1% of fetuses. In  the absence of other anomalies, the risk for aneuploidies is very low. SUA can be associated with fetal growth restriction. The association of SUA with cardiac anomalies is not conclusively proven.  I also informed her that ultrasound has limitations in detecting fetal anomalies. I discussed amniocentesis for a definitive result on the fetal karyotype. Patient opted not to have amniocentesis.  Renal finding (echogenic right kidney of twin B) is probably a normal variant.   Recommendations: -Consider increasing insulin NPH to 20 units in the morning and 20 units at night. -Add Humalog 4 units with each meal and increase as needed. -Check fasting and postprandial (1 or 2 hours) blood glucose. -Hb A1C with next blood draw. -BP monitoring as per protocol. -Weekly BPP and NST. -Antibiotics if UTI is not treated.  Thank you for your consult. Please do not hesitate to contact me if you have any questions or concerns.  Consultation including face-to-face counseling: 40 min.

## 2018-01-28 DIAGNOSIS — O139 Gestational [pregnancy-induced] hypertension without significant proteinuria, unspecified trimester: Secondary | ICD-10-CM | POA: Diagnosis present

## 2018-01-28 LAB — GLUCOSE, CAPILLARY
Glucose-Capillary: 85 mg/dL (ref 70–99)
Glucose-Capillary: 102 mg/dL — ABNORMAL HIGH (ref 70–99)
Glucose-Capillary: 90 mg/dL (ref 70–99)
Glucose-Capillary: 119 mg/dL — ABNORMAL HIGH (ref 70–99)

## 2018-01-28 NOTE — Progress Notes (Addendum)
Ellen Martin is a 32 y.o. female patient who is 31w 5d pregnant with di/di twins.  1. Continuous RUQ abdominal pain   2. Dichorionic diamniotic twin pregnancy in third trimester   3. Abdominal pain during pregnancy, third trimester   4. Type 2 diabetes mellitus affecting pregnancy in third trimester, antepartum   5. Right upper quadrant pain   6. Diabetes type 2, uncontrolled (HCC)   7. Gestational diabetes   8. DM (diabetes mellitus) (HCC)     Past Medical History:  Diagnosis Date  . Diabetes mellitus without complication (HCC)    Type 2  . Hypertension     Current Facility-Administered Medications  Medication Dose Route Frequency Provider Last Rate Last Dose  . acetaminophen (TYLENOL) tablet 650 mg  650 mg Oral Q4H PRN Kenney Houseman, CNM   650 mg at 01/28/18 1754  . calcium carbonate (TUMS - dosed in mg elemental calcium) chewable tablet 400 mg of elemental calcium  2 tablet Oral Q4H PRN Kenney Houseman, CNM   400 mg of elemental calcium at 01/28/18 1754  . cephALEXin (KEFLEX) capsule 500 mg  500 mg Oral Q12H Janeece Riggers, CNM   500 mg at 01/28/18 2149  . docusate sodium (COLACE) capsule 100 mg  100 mg Oral Daily Kenney Houseman, CNM   100 mg at 01/28/18 0827  . famotidine (PEPCID) tablet 20 mg  20 mg Oral BID Kenney Houseman, CNM   20 mg at 01/28/18 2149  . insulin aspart (novoLOG) injection 0-14 Units  0-14 Units Subcutaneous TID PC Janeece Riggers, CNM   1 Units at 01/28/18 2005  . insulin aspart (novoLOG) injection 8 Units  8 Units Subcutaneous TID WC Janeece Riggers, CNM   8 Units at 01/28/18 1801  . insulin NPH Human (HUMULIN N,NOVOLIN N) injection 20 Units  20 Units Subcutaneous BID AC & HS Jaymes Graff, MD   20 Units at 01/28/18 2236  . ondansetron (ZOFRAN) injection 4 mg  4 mg Intravenous Q6H PRN Rebbeca Paul M, CNM   4 mg at 01/27/18 1641   No Known Allergies Active Problems:   Dichorionic diamniotic twin pregnancy in third trimester  Gestational hypertension  Subjective Patient states she is not in pain. I discussed possible plan for discharge tomorrow. She had a flat affect throughout the discussion. She has had several elevated blood pressures >140/90 where pain was not contributory. She meets criteria for diagnosis of gestational hypertension but her pressures remain out of the severe range without medication at this time. Her blood sugars are significantly improved from her admission levels.   Objective:  Vitals:   01/28/18 1608 01/28/18 1946 01/28/18 2315 01/29/18 0438  BP: 130/76 (!) 147/75 (!) 144/87 132/68  Pulse: 85 82 70 76  Resp: 17 18 18 16   Temp: 98.9 F (37.2 C) 98.3 F (36.8 C) 98.4 F (36.9 C) 98.4 F (36.9 C)  TempSrc: Oral Oral Oral Oral  SpO2: 98% 100% 98% 98%  Weight:      Height:       Results for orders placed or performed during the hospital encounter of 01/25/18 (from the past 24 hour(s))  Glucose, capillary     Status: None   Collection Time: 01/28/18  8:09 AM  Result Value Ref Range   Glucose-Capillary 85 70 - 99 mg/dL  Glucose, capillary     Status: Abnormal   Collection Time: 01/28/18 10:18 AM  Result Value Ref Range   Glucose-Capillary 102 (H) 70 -  99 mg/dL  Glucose, capillary     Status: None   Collection Time: 01/28/18  2:51 PM  Result Value Ref Range   Glucose-Capillary 90 70 - 99 mg/dL  Glucose, capillary     Status: Abnormal   Collection Time: 01/28/18  8:01 PM  Result Value Ref Range   Glucose-Capillary 119 (H) 70 - 99 mg/dL   Comment 1 Document in Chart     Assessment/plan: 32 y.o. G7P3 at 6190w5d  Di/di twins Reactive NST x2 Cholelithiasis Betamethasone complete 11/25 GBS Positive UTI on Keflex BID Continue current plan of care, patient for possible discharge today   Janeece Riggersllis K Greer 01/29/2018   NST nonreactive x 2 earlier today.  BPP x 2 ordered.  CBGs well controlled.  Dr. Judeth CornfieldShankar reported that baby A had 8/10 BPP but baby B had a 6/10 BPP.  If tracing  becomes reactive for baby B, may consider d/c home today.  Pt would really like to go home today d/t Thanksgiving holiday tomorrow.  Will do NSTs now.

## 2018-01-28 NOTE — Clinical SW OB High Risk (Signed)
Clinical Social Work Antenatal   Clinical Social Worker:  Antionette Poles, [place] Date/Time:  01/28/2018, 1:16 PM Gestational Age on Admission:  32 y.o. Admitting Diagnosis:  "Dichorionic diamniotic twin pregnancy in third trimester"   Expected Delivery Date:  03/26/18  [facility] Environment  Home Address:  [address] [place], [place] [address]  Household Member/Support Name:  Ellen Martin (significant other; FOB) Relationship:  Significant Other, Mother, Neighbor Other Support:  Mother and neighbors   Psychosocial Data  Information Source:  Patient Interview Resources:  Significant other is employed; WIC/Foodstamps    Employment:  Unemployed   Medicaid [facility]):  [employer] School:  N/A   Current Grade:  N/A  Homebound Arranged: No  Other Resources:  Copywriter, advertising Care:     Strengths/Weaknesses/Factors to Consider  Concerns Related to Hospitalization:  None reported    Previous Pregnancies/Feelings Towards Pregnancy?  Concerns related to being/becoming a mother?:  Patient reported that she is excited about being pregnant with twins  Social Support (FOB? Who is/will be helping with baby/other kids?):   Couples Relationship (describe):    Recent Stressful Life Events (life changes in past year?):     Prenatal Care/Education/Home Preparations:    Domestic Violence (of any type):  No If Yes to Domestic Violence, Describe/Action Plan:     Substance Use During Pregnancy: Yes (If Yes, Complete SBIRT)  Complete PHQ-9 (Depresssion Screening) on all Antenatal Patients PHQ-9 Score (If Score => 15 complete TREAT):     Follow-up Recommendations:     Patient Advised/Response:      Other:      Clinical Assessment/Plan:   CSW spoke with patient at bedside regarding consult for substance use during pregnancy and transportation needs. CSW inquired about patient's substance use during pregnancy and informed her about  positive UDS for (amphetamines and THC). Patient reported that she smoked marijuana to assist with nausea and that she only used amphetamines one time. CSW inquired about why MOB used amphetamines, patient reported that she was going through stuff and used it with a friend. CSW asked patient how she usually copes with things other than drug use, patient reported that she usually talks to her supports but that she didn't have anyone to talk to. CSW and patient discussed healthy coping skills and how the use of substances was not good for the babies. Patient reported that she did not plan on using substances anymore. CSW offered patient substance use treatment resources, Patient declined and reported that she did not have a problem. CSW inquired about patient's support system, patient reported that she has the father of her twins, her mother and her neighbors. Patient reported that her mother is coming from [name] to stay with her for a couple months to help take care of the babies after she delivers. Patient reported that she is happy that her mother is coming to stay with her. Patient reported that her neighbors are also supportive and will be the god parents of the twins when they are born.    CSW inquired about patient's other children. Patient reported that she has 3 daughters Ellen Martin 11/15/12 who is in her custody) Ellen Martin 01-11-06; Ellen Martin 04-15-07 who are not in her custody). Patient reported that her older two children live in [place] with her cousin, noting she gave her cousin temporary custody when she went to prison in 2011. Patient reported that she was released from prison in 2013 and that she communicates with her two  older daughters via [employer]. CSW inquired if patient had any involvement/history with CPS, patient reported none. CSW inquired if CPS was involved when she provided temporary custody of her two older daughters to her cousin, patient replied no. Patient reported  that she currently lives with the father of the twins and her youngest daughter. Patient reported that her youngest daughter also stays with her dad sometimes. Patient reported that her family is helping her obtain needed items for baby and that she does not anticipate needing anything for the baby.  CSW inquired about patient's transportation needs, patient reported that she needed transportation for appointments. Patient reported that she can't always rely on other people for transportation. CSW informed patient about medicaid transportation and encouraged patient to apply. CSW also provided patient with bus passes. CSW inquired if patient was interested in any community support programs, patient reported not right now but agreed to take some information. CSW provided patient with brochure for healthy parenting programs.   CSW will continue to offer support while patient is admitted to the hospital.   Ellen Martin, LCSWA Clinical Social Worker [facility] Cell#: [phone]

## 2018-01-28 NOTE — Care Management (Signed)
Case Manager met with patient in room. CM verified with financial counselor Laverta Baltimore here at [facility] that patient does have active Medicaid.  Patient stated she uses the pharmacy [facility] at [facility] # [phone].  CM called the pharmacy  there and spoke to [place] and the CBG that is covered by Medicaid is: Accucheck Aviva or Accuchek Guide.  When discharge please write prescription for either one of those CBG meters.  On a separate prescription please write for strips, lancets and with number and instructions per pharmacy and this will be covered by Medicaid. Please call for any other needs.

## 2018-01-28 NOTE — Progress Notes (Addendum)
Presented with:  Ellen Martin is a 32 y.o. female, [phone] at 31+1 weeks with di/di twins, DM type II on insulin,  presenting for contractions and sharp RUQ pain.  Care initiated at 17 weeks.  Has not been back to office. Pt admitted for gallstones found on [place] and uncontrolled DM2. Pt also was diagnosed with UTI at [facility] ED on 01/18/18 and was prescribed amoxicillin. She never filled her prescription or took this medication. Urinalysis and urine culture ordered.   Subjective: Today pt is 31 +[redacted] weeks gestation with DiDi twins, pt stable in bed, resting well with NAD, +FM for both twins. Able to trace NST which was reative with twin A,  Was able to obtain heart rate from [place] which was 138 from Twin B. Good FM with both. Pt denies RUQ pain currently, no HA, vision changes, calf pain, n, v, d, rashes, fever, cp or sob. Pt denies dysuria or hematuria.   1. Continuous RUQ abdominal pain   2. Dichorionic diamniotic twin pregnancy in third trimester   3. Abdominal pain during pregnancy, third trimester   4. Type 2 diabetes mellitus affecting pregnancy in third trimester, antepartum   5. Right upper quadrant pain   6. Diabetes type 2, uncontrolled (HCC)   7. Gestational diabetes     Past Medical History:  Diagnosis Date  . Diabetes mellitus without complication (HCC)    Type 2  . Hypertension     Current Facility-Administered Medications  Medication Dose Route Frequency Provider Last Rate Last Dose  . acetaminophen (TYLENOL) tablet 650 mg  650 mg Oral Q4H PRN Kenney Houseman, CNM   650 mg at 01/27/18 1858  . calcium carbonate (TUMS - dosed in mg elemental calcium) chewable tablet 400 mg of elemental calcium  2 tablet Oral Q4H PRN Prothero, Henderson Newcomer, CNM      . cephALEXin (KEFLEX) capsule 500 mg  500 mg Oral Q12H Janeece Riggers, CNM   500 mg at 01/27/18 2128  . docusate sodium (COLACE) capsule 100 mg  100 mg Oral Daily Kenney Houseman, CNM   100 mg at 01/27/18 4540  . famotidine (PEPCID)  tablet 20 mg  20 mg Oral BID Kenney Houseman, CNM   20 mg at 01/27/18 2128  . insulin aspart (novoLOG) injection 0-14 Units  0-14 Units Subcutaneous TID PC Janeece Riggers, CNM   1 Units at 01/27/18 2128  . insulin aspart (novoLOG) injection 8 Units  8 Units Subcutaneous TID WC Janeece Riggers, CNM   8 Units at 01/27/18 1807  . insulin NPH Human (HUMULIN N,NOVOLIN N) injection 20 Units  20 Units Subcutaneous BID AC & HS Jaymes Graff, MD   20 Units at 01/27/18 2127  . ondansetron (ZOFRAN) injection 4 mg  4 mg Intravenous Q6H PRN Rebbeca Paul M, CNM   4 mg at 01/27/18 1641  . ondansetron (ZOFRAN-ODT) disintegrating tablet 4 mg  4 mg Oral Once Prothero, Henderson Newcomer, CNM       No Known Allergies Active Problems:   Dichorionic diamniotic twin pregnancy in third trimester   Objective:  Vitals:   01/27/18 1603 01/27/18 1935 01/27/18 2314 01/28/18 0253  BP: 125/82 137/75 123/77 (!) 130/58  Pulse: 82 77 82 72  Resp: 18 19 19 19   Temp: 98.7 F (37.1 C) 98.6 F (37 C) 98.7 F (37.1 C) 98.4 F (36.9 C)  TempSrc: Oral Oral Oral Oral  SpO2: 100% 98% 97% 100%  Weight:      Height:  Results for orders placed or performed during the hospital encounter of 01/25/18 (from the past 24 hour(s))  Glucose, capillary     Status: Abnormal   Collection Time: 01/27/18  9:48 AM  Result Value Ref Range   Glucose-Capillary 167 (H) 70 - 99 mg/dL  Glucose, capillary     Status: Abnormal   Collection Time: 01/27/18  2:47 PM  Result Value Ref Range   Glucose-Capillary 150 (H) 70 - 99 mg/dL  Urinalysis, Routine w reflex microscopic     Status: Abnormal   Collection Time: 01/27/18  4:43 PM  Result Value Ref Range   Color, Urine YELLOW YELLOW   APPearance CLEAR CLEAR   Specific Gravity, Urine 1.018 1.005 - 1.030   pH 7.0 5.0 - 8.0   Glucose, UA NEGATIVE NEGATIVE mg/dL   Hgb urine dipstick NEGATIVE NEGATIVE   Bilirubin Urine NEGATIVE NEGATIVE   Ketones, ur NEGATIVE NEGATIVE mg/dL   Protein, ur  NEGATIVE NEGATIVE mg/dL   Nitrite NEGATIVE NEGATIVE   Leukocytes, UA TRACE (A) NEGATIVE   RBC / HPF 0-5 0 - 5 RBC/hpf   WBC, UA 0-5 0 - 5 WBC/hpf   Bacteria, UA MANY (A) NONE SEEN   Squamous Epithelial / LPF 6-10 0 - 5   Mucus PRESENT   Glucose, capillary     Status: None   Collection Time: 01/27/18  9:09 PM  Result Value Ref Range   Glucose-Capillary 94 70 - 99 mg/dL   [place] Mfm Ob Follow Up  Result Date: 01/27/2018 ----------------------------------------------------------------------  OBSTETRICS REPORT                       (Signed Final 01/27/2018 04:36 pm) ---------------------------------------------------------------------- Patient Info  ID #:       [id number]                          D.O.B.:  [birth year] (32 yrs)  Name:       Ellen Martin                  Visit Date: 01/27/2018 01:11 pm ---------------------------------------------------------------------- Performed By  Performed By:     Lenise Arena        Ref. Address:     [facility]                                                             Obstetrics &                                                             Gynecology                                                             [address]                                                             [address]                                                             [place], [place]                                                             [address]  Attending:        Noralee Space MD        Secondary Phy.:   L&D Nursing- L/D                                                             [id number]  Referred By:      Nigel Bridgeman           Location:         [facility]                    CNM ---------------------------------------------------------------------- Orders   #  Description                          Code         Ordered By   1  [place] MFM OB FOLLOW UP ADDL             52841.32      CAROL HARTSOG      GEST   2  [place] MFM OB FOLLOW UP                  44010.27     RAVI [facility]  ----------------------------------------------------------------------   #  Order #                    Accession #                 Episode #   1  [id number]                  [id number]                  [id number]   2  [id number]                  [id number]                  [id number]  ---------------------------------------------------------------------- Indications   [redacted] weeks gestation of pregnancy                Z3A.31   Abdominal pain in pregnancy  O60.89   Twin pregnancy, di/di, second trimester        O30.042   Pre-existing diabetes, type 2, in pregnancy,   O24.112   second trimester (Insulin)   Obesity complicating pregnancy, second         O99.212   trimester (BMI 43)   Abnormal biochemical screen (Panorama-         O28.9   Insufficient fetal DNA FF 2.0%, 1.2%)  ---------------------------------------------------------------------- Vital Signs  Weight (lb): 283                               Height:        5'9"  BMI:         41.79 ---------------------------------------------------------------------- Fetal Evaluation (Fetus A)  Num Of Fetuses:         2  Fetal Heart Rate(bpm):  137  Cardiac Activity:       Observed  Fetal Lie:              Maternal right side  Presentation:           Breech  Placenta:               Posterior  Amniotic Fluid  AFI FV:      Within normal limits                              Largest Pocket(cm)                              5.83 ---------------------------------------------------------------------- Biometry (Fetus A)  BPD:      74.3  mm     G. Age:  29w 6d          5  %    CI:        73.81   %    70 - 86                                                          FL/HC:      21.3   %    19.3 - 21.3  HC:      274.7  mm     G. Age:  30w 0d        < 3  %    HC/AC:      0.97        0.96 - 1.17  AC:      283.3  mm     G. Age:  32w 2d         75  %    FL/BPD:     78.7   %    71 - 87  FL:        58.5  mm     G. Age:  30w 4d         17  %    FL/AC:      20.6   %    20 - 24  HUM:      49.4  mm     G. Age:  29w 0d        < 5  %  LV:        3.7  mm  Est. FW:    1751  gm    3 lb 14 oz      54  %     FW Discordancy     0 \ 16 % ---------------------------------------------------------------------- OB History  Gravidity:    7         Term:   3        Prem:   0        SAB:   3  TOP:          0       Ectopic:  0        Living: 3 ---------------------------------------------------------------------- Gestational Age (Fetus A)  U/S Today:     30w 5d                                        EDD:   04/02/18  Best:          31w 3d     Det. By:  Marcella Dubs         EDD:   03/28/18 ---------------------------------------------------------------------- Anatomy (Fetus A)  Cranium:               Appears normal         Aortic Arch:            Previously seen  Cavum:                 Appears normal         Ductal Arch:            Not well visualized  Ventricles:            Appears normal         Diaphragm:              Previously seen  Choroid Plexus:        Previously seen        Stomach:                Appears normal, left                                                                        sided  Cerebellum:            Previously seen        Abdomen:                Appears normal  Posterior Fossa:       Previously seen        Abdominal Wall:         Previously seen  Nuchal Fold:           Not applicable (>20    Cord Vessels:           Previously seen                         wks GA)  Face:                  Orbits  and profile     Kidneys:                Appear normal                         previously seen  Lips:                  Previously seen        Bladder:                Appears normal  Thoracic:              Appears normal         Spine:                  Ltd views no                                                                        intracranial signs of                                                                         NT  Heart:                 Previously seen        Upper Extremities:      Previously seen  RVOT:                  Appears normal         Lower Extremities:      Previously seen  LVOT:                  Not well visualized  Other:  Fetus appears to be a female. Technically difficult due to maternal          habitus and fetal position. ---------------------------------------------------------------------- Fetal Evaluation (Fetus B)  Num Of Fetuses:         2  Fetal Heart Rate(bpm):  157  Cardiac Activity:       Observed  Fetal Lie:              Maternal left side  Presentation:           Breech  Placenta:               Posterior  Membrane Desc:      Dividing Membrane seen  Amniotic Fluid  AFI FV:      Within normal limits                              Largest Pocket(cm)                              4.39 ---------------------------------------------------------------------- Biometry (Fetus B)  BPD:      77.3  mm     G.  Age:  31w 0d         27  %    CI:        74.67   %    70 - 86                                                          FL/HC:      18.2   %    19.3 - 21.3  HC:      283.9  mm     G. Age:  31w 1d         11  %    HC/AC:      1.06        0.96 - 1.17  AC:      268.1  mm     G. Age:  30w 6d         33  %    FL/BPD:     66.8   %    71 - 87  FL:       51.6  mm     G. Age:  27w 4d        < 3  %    FL/AC:      19.2   %    20 - 24  HUM:      47.8  mm     G. Age:  28w 0d        < 5  %  LV:        5.9  mm  Est. FW:    1473  gm      3 lb 4 oz     25  %     FW Discordancy        16  % ---------------------------------------------------------------------- Gestational Age (Fetus B)  U/S Today:     30w 1d                                        EDD:   04/06/18  Best:          31w 3d     Det. By:  Marcella Dubs         EDD:   03/28/18 ---------------------------------------------------------------------- Anatomy (Fetus B)  Cranium:               Appears normal         Aortic Arch:            Not well  visualized  Cavum:                 Appears normal         Ductal Arch:            Not well visualized  Ventricles:            Appears normal         Diaphragm:              Appears normal  Choroid Plexus:        Not well visualized    Stomach:                Appears normal, left  sided  Cerebellum:            Previously seen        Abdomen:                Appears normal  Posterior Fossa:       Previously seen        Abdominal Wall:         Previously seen  Nuchal Fold:           Not applicable (>20    Cord Vessels:           2 vessel cord,                         wks GA)                                                                        absent right umb                                                                        art  Face:                  Profile previously     Kidneys:                See comments                         seen  Lips:                  Previously seen        Bladder:                Appears normal  Thoracic:              Appears normal         Spine:                  Ltd views no                                                                        intracranial signs of                                                                        NT  Heart:  Not well visualized    Upper Extremities:      Previously seen  RVOT:                  Appears normal         Lower Extremities:      Previously seen  LVOT:                  Appears normal  Other:  Fetus appears to be female. Nasal bone visualized. Open Rt hand          seen. Technically difficult due to maternal habitus and fetal position. ---------------------------------------------------------------------- Cervix Uterus Adnexa  Cervix  Not visualized (advanced GA >24wks) ---------------------------------------------------------------------- Impression  Dichorionic-diamniotic twin pregnancy.  Twin A: Maternal right, breech, posterior placenta. Fetal   growth is appropriate for gestational age. Amniotic fluid is  normal and good fetal activity is seen.  Twin B: Maternal left, breech (initially in the cephalic  position), posterior placenta. Fetal growth is appropriate for  gestational age. Amniotic fluid is normal and good fetal  activity is seen. Signficant findings include a) single umbilical  artery, b) slight echogenicity of right kidney. Right kidney,  otherwise, looks normal with urinary tract dilation or cysts.  Left kidney appears normal.  Growth discordancy: 16% (normal).  See consultation note in EPIC. ---------------------------------------------------------------------- Recommendations  -Consider increasing insulin NPH to 20 units in the morning  and 20 units at night.  -Add Humalog 4 units with each meal and increase as  needed.  -Check fasting and postprandial (1 or 2 hours) blood glucose.  -Hb A1C with next blood draw.  -BP monitoring as per protocol.  -Weekly BPP and NST.  --Antibiotics if UTI is not treated. ----------------------------------------------------------------------                  Noralee Space, MD Electronically Signed Final Report   01/27/2018 04:36 pm ----------------------------------------------------------------------  [place] Mfm Ob Follow Up Addl Gest  Result Date: 01/27/2018 ----------------------------------------------------------------------  OBSTETRICS REPORT                       (Signed Final 01/27/2018 04:36 pm) ---------------------------------------------------------------------- Patient Info  ID #:       [id number]                          D.O.B.:  1985-06-01 (32 yrs)  Name:       Ellen Martin                  Visit Date: 01/27/2018 01:11 pm ---------------------------------------------------------------------- Performed By  Performed By:     Lenise Arena        Ref. Address:     [facility]                                                             Obstetrics &  Gynecology                                                             [address]                                                             [address]                                                             [place], [place]                                                             [address]  Attending:        Noralee Space MD        Secondary Phy.:   L&D Nursing- L/D                                                             [id number]  Referred By:      Nigel Bridgeman           Location:         [facility]                    CNM ---------------------------------------------------------------------- Orders   #  Description                          Code         Ordered By   1  [place] MFM OB FOLLOW UP ADDL             60454.09     CAROL HARTSOG      GEST   2  [place] MFM OB FOLLOW UP                  81191.47     RAVI SHANKAR  ----------------------------------------------------------------------   #  Order #                    Accession #  Episode #   1  [id number]                  [id number]                  [id number]   2  [id number]                  [id number]                  [id number]  ---------------------------------------------------------------------- Indications   [redacted] weeks gestation of pregnancy                Z3A.31   Abdominal pain in pregnancy                    O99.89   Twin pregnancy, di/di, second trimester        O30.042   Pre-existing diabetes, type 2, in pregnancy,   O24.112   second trimester (Insulin)   Obesity complicating pregnancy, second         O99.212   trimester (BMI 43)   Abnormal biochemical screen (Panorama-         O28.9   Insufficient fetal DNA FF 2.0%, 1.2%)  ---------------------------------------------------------------------- Vital Signs  Weight (lb): 283                               Height:        5'9"  BMI:         41.79  ---------------------------------------------------------------------- Fetal Evaluation (Fetus A)  Num Of Fetuses:         2  Fetal Heart Rate(bpm):  137  Cardiac Activity:       Observed  Fetal Lie:              Maternal right side  Presentation:           Breech  Placenta:               Posterior  Amniotic Fluid  AFI FV:      Within normal limits                              Largest Pocket(cm)                              5.83 ---------------------------------------------------------------------- Biometry (Fetus A)  BPD:      74.3  mm     G. Age:  29w 6d          5  %    CI:        73.81   %    70 - 86                                                          FL/HC:      21.3   %    19.3 - 21.3  HC:      274.7  mm     G. Age:  30w 0d        < 3  %    HC/AC:      0.97  0.96 - 1.17  AC:      283.3  mm     G. Age:  32w 2d         75  %    FL/BPD:     78.7   %    71 - 87  FL:       58.5  mm     G. Age:  30w 4d         17  %    FL/AC:      20.6   %    20 - 24  HUM:      49.4  mm     G. Age:  29w 0d        < 5  %  LV:        3.7  mm  Est. FW:    1751  gm    3 lb 14 oz      54  %     FW Discordancy     0 \ 16 % ---------------------------------------------------------------------- OB History  Gravidity:    7         Term:   3        Prem:   0        SAB:   3  TOP:          0       Ectopic:  0        Living: 3 ---------------------------------------------------------------------- Gestational Age (Fetus A)  U/S Today:     30w 5d                                        EDD:   04/02/18  Best:          31w 3d     Det. By:  Marcella Dubs         EDD:   03/28/18 ---------------------------------------------------------------------- Anatomy (Fetus A)  Cranium:               Appears normal         Aortic Arch:            Previously seen  Cavum:                 Appears normal         Ductal Arch:            Not well visualized  Ventricles:            Appears normal         Diaphragm:              Previously seen  Choroid  Plexus:        Previously seen        Stomach:                Appears normal, left                                                                        sided  Cerebellum:            Previously seen  Abdomen:                Appears normal  Posterior Fossa:       Previously seen        Abdominal Wall:         Previously seen  Nuchal Fold:           Not applicable (>20    Cord Vessels:           Previously seen                         wks GA)  Face:                  Orbits and profile     Kidneys:                Appear normal                         previously seen  Lips:                  Previously seen        Bladder:                Appears normal  Thoracic:              Appears normal         Spine:                  Ltd views no                                                                        intracranial signs of                                                                        NT  Heart:                 Previously seen        Upper Extremities:      Previously seen  RVOT:                  Appears normal         Lower Extremities:      Previously seen  LVOT:                  Not well visualized  Other:  Fetus appears to be a female. Technically difficult due to maternal          habitus and fetal position. ---------------------------------------------------------------------- Fetal Evaluation (Fetus B)  Num Of Fetuses:         2  Fetal Heart Rate(bpm):  157  Cardiac Activity:       Observed  Fetal Lie:              Maternal left side  Presentation:  Breech  Placenta:               Posterior  Membrane Desc:      Dividing Membrane seen  Amniotic Fluid  AFI FV:      Within normal limits                              Largest Pocket(cm)                              4.39 ---------------------------------------------------------------------- Biometry (Fetus B)  BPD:      77.3  mm     G. Age:  31w 0d         27  %    CI:        74.67   %    70 - 86                                                           FL/HC:      18.2   %    19.3 - 21.3  HC:      283.9  mm     G. Age:  31w 1d         11  %    HC/AC:      1.06        0.96 - 1.17  AC:      268.1  mm     G. Age:  30w 6d         33  %    FL/BPD:     66.8   %    71 - 87  FL:       51.6  mm     G. Age:  27w 4d        < 3  %    FL/AC:      19.2   %    20 - 24  HUM:      47.8  mm     G. Age:  28w 0d        < 5  %  LV:        5.9  mm  Est. FW:    1473  gm      3 lb 4 oz     25  %     FW Discordancy        16  % ---------------------------------------------------------------------- Gestational Age (Fetus B)  U/S Today:     30w 1d                                        EDD:   04/06/18  Best:          31w 3d     Det. By:  Marcella Dubs         EDD:   03/28/18 ---------------------------------------------------------------------- Anatomy (Fetus B)  Cranium:               Appears normal         Aortic Arch:            Not well  visualized  Cavum:                 Appears normal         Ductal Arch:            Not well visualized  Ventricles:            Appears normal         Diaphragm:              Appears normal  Choroid Plexus:        Not well visualized    Stomach:                Appears normal, left                                                                        sided  Cerebellum:            Previously seen        Abdomen:                Appears normal  Posterior Fossa:       Previously seen        Abdominal Wall:         Previously seen  Nuchal Fold:           Not applicable (>20    Cord Vessels:           2 vessel cord,                         wks GA)                                                                        absent right umb                                                                        art  Face:                  Profile previously     Kidneys:                See comments                         seen  Lips:                  Previously seen        Bladder:                Appears normal  Thoracic:              Appears normal  Spine:                  Ltd views no                                                                        intracranial signs of                                                                        NT  Heart:                 Not well visualized    Upper Extremities:      Previously seen  RVOT:                  Appears normal         Lower Extremities:      Previously seen  LVOT:                  Appears normal  Other:  Fetus appears to be female. Nasal bone visualized. Open Rt hand          seen. Technically difficult due to maternal habitus and fetal position. ---------------------------------------------------------------------- Cervix Uterus Adnexa  Cervix  Not visualized (advanced GA >24wks) ---------------------------------------------------------------------- Impression  Dichorionic-diamniotic twin pregnancy.  Twin A: Maternal right, breech, posterior placenta. Fetal  growth is appropriate for gestational age. Amniotic fluid is  normal and good fetal activity is seen.  Twin B: Maternal left, breech (initially in the cephalic  position), posterior placenta. Fetal growth is appropriate for  gestational age. Amniotic fluid is normal and good fetal  activity is seen. Signficant findings include a) single umbilical  artery, b) slight echogenicity of right kidney. Right kidney,  otherwise, looks normal with urinary tract dilation or cysts.  Left kidney appears normal.  Growth discordancy: 16% (normal).  See consultation note in EPIC. ---------------------------------------------------------------------- Recommendations  -Consider increasing insulin NPH to 20 units in the morning  and 20 units at night.  -Add Humalog 4 units with each meal and increase as  needed.  -Check fasting and postprandial (1 or 2 hours) blood glucose.  -Hb A1C with next blood draw.  -BP monitoring as per protocol.  -Weekly BPP and NST.  --Antibiotics if UTI is not treated.  ----------------------------------------------------------------------                  Noralee Space, MD Electronically Signed Final Report   01/27/2018 04:36 pm ----------------------------------------------------------------------  [place] Mfm Ob Limited  Result Date: 01/25/2018 ----------------------------------------------------------------------  OBSTETRICS REPORT                       (Signed Final 01/25/2018 02:42 pm) ---------------------------------------------------------------------- Patient Info  ID #:       [id number]                          D.O.B.:  1985/05/27 (32 yrs)  Name:  Ellen Martin                  Visit Date: 01/25/2018 10:43 am ---------------------------------------------------------------------- Performed By  Performed By:     Birdena Crandall        Ref. Address:      [facility]                    RDMS,RVT                                                              Obstetrics &                                                              Gynecology                                                              [address]                                                              [address]                                                              [place], [place]                                                              [address]  Attending:        Noralee Space MD        Secondary Phy.:    L&D Nursing- L/D                                                              [id number]  Referred By:      Nigel Bridgeman           Location:          [facility]                    CNM ---------------------------------------------------------------------- Orders   #  Description                          Code         Ordered By   1  [place] MFM OB LIMITED                    [address].60     Myna Hidalgo  ----------------------------------------------------------------------   #  Order #                    Accession #                  Episode #   1  [id number]                  [id number]                  [id number]  ---------------------------------------------------------------------- Indications   Abdominal pain in pregnancy                    O99.89   Twin pregnancy, di/di, second trimester        O30.042   Pre-existing diabetes, type 2, in pregnancy,   O24.112   second trimester (Insulin)   Obesity complicating pregnancy, second         O99.212   trimester (BMI 43)   Abnormal biochemical screen (Panorama-         O28.9   Insufficient fetal DNA FF 2.0%, 1.2%)   [redacted] weeks gestation of pregnancy                Z3A.31  ---------------------------------------------------------------------- Fetal Evaluation (Fetus A)  Num Of Fetuses:          2  Fetal Heart Rate(bpm):   158  Cardiac Activity:        Observed  Fetal Lie:               Maternal right side  Presentation:            Breech  Placenta:                Posterior  Amniotic Fluid  AFI FV:      Within normal limits                              Largest Pocket(cm)                              6.14  Comment:    No placental abruption or previa identified. ---------------------------------------------------------------------- OB History  Gravidity:    7         Term:   3        Prem:   0        SAB:   3  TOP:          0       Ectopic:  0        Living: 3 ---------------------------------------------------------------------- Gestational Age (Fetus A)  Best:  31w 1d     Det. By:  Marcella Dubs         EDD:   03/28/18 ---------------------------------------------------------------------- Fetal Evaluation (Fetus B)  Num Of Fetuses:          2  Fetal Heart Rate(bpm):   157  Cardiac Activity:        Observed  Fetal Lie:               Maternal left side  Presentation:            Breech  Placenta:                Posterior  Membrane Desc:      Dividing Membrane seen  Amniotic Fluid  AFI FV:      Within normal limits                              Largest Pocket(cm)                               5.67  Comment:    No placental abruption or previa identified. ---------------------------------------------------------------------- Gestational Age (Fetus B)  Best:          31w 1d     Det. By:  Marcella Dubs         EDD:   03/28/18 ---------------------------------------------------------------------- Cervix Uterus Adnexa  Cervix  Length:            4.5  cm.  Normal appearance by transabdominal scan.  Comment  Bladder wall is thick, echogenic inner layer  present (cystitis?) ---------------------------------------------------------------------- Impression  Dichorionic-diamniotic twin pregnancy.  Patient is being evaluated for preterm labor.  A limited ultrasound study was performed.  Twin A: Maternal right, breech, posterior placenta.Amniotic  fluid is normal and good fetal activity is seen.  Twin B: Maternal left, breech, posterior placenta. Amniotic  fluid is normal and good fetal activity is seen. ----------------------------------------------------------------------                  Noralee Space, MD Electronically Signed Final Report   01/25/2018 02:42 pm ----------------------------------------------------------------------  US Abdomen Limited Ruq  Result Date: 01/25/2018 CLINICAL DATA:  Right upper quadrant abdominal pain. EXAM: ULTRASOUND ABDOMEN LIMITED RIGHT UPPER QUADRANT COMPARISON:  None. FINDINGS: Gallbladder: Large stone within the gallbladder lumen. Sludge within the gallbladder lumen. No wall thickening or pericholecystic fluid. Mild pain with scanning within the right upper quadrant. Common bile duct: Diameter: 4 mm Liver: No focal lesion identified. Within normal limits in parenchymal echogenicity. Portal vein is patent on color Doppler imaging with normal direction of blood flow towards the liver. IMPRESSION: Cholelithiasis and sludge without secondary signs to suggest acute cholecystitis. Electronically Signed   By: Annia Belt M.D.   On: 01/25/2018 10:48    Labs: Hb 14, Hct 39.4,  WBC 11.3, PLT 219, AST 14, ALT 10, creatinine 0.44, electrolytes normal. All fasting and postprandial levels are elevated (fasting 197 and 157 mg/dL). CBG (last 3)  Recent Labs    01/27/18 0948 01/27/18 1447 01/27/18 2109  GLUCAP 167* 150* 94    Assessment/plan: 32 y.o. G7P3 at [redacted]w[redacted]d, Di/di twins, DM2, elevated BP reading, gallstones, UTI.   Fetal wellbeing: Dichorionic-diamniotic twin pregnancy.Twin A: Maternal right, breech, posterior placenta. Fetal growth is appropriate for gestational age. Amniotic fluid is normal and good fetal activity is seen. Twin B: Maternal left, breech (initially in  the cephalic position), posterior placenta. Fetal growth is appropriate for gestational age. Amniotic fluid is normal and good fetal activity is seen. Signficant findings include a) single umbilical artery, b) slight echogenicity of right kidney. Right kidney, otherwise, looks normal with urinary tract dilation or cysts. Left kidney appears normal.  Growth discordancy: 16% (normal).  Type 2 diabetes: Emphasized the importance of good control of diabetes to prevent adverse fetal and neonatal outcomes including stillbirth and neonatal complications (respiratory-distress syndrome and NICU stay). Monitor FBS and 2H PP, 20units NPH BID, 8units novolog with meals, and sliding scale as needed. Steroids induce hyperglycemia and insulin requirement may fall after 48 to 72 hours. Needs week antenatal testing and MFM recommended pt stays in pt until sugars are controlled. MFM recommends Timing of delivery: If blood glucose levels are well-controlled, patient can be delivered at 38 weeks. If suboptimal control is seen, delivery at 37 weeks is reasonable per MFM. Weekly BPP and NST. HGBA1C 9.7  R/o gestational hypertension: She had 2 SBP over 140 mm Hg (patient was also having pain). Frequent blood pressure monitoring should reveal if the patient has gestational hypertension. If pregnancy is complicated by  gestational hypertension or preclampsia, MFM recommends delivery between 86 and 37 weeks. Current BP 130/58, asymptomatic. Continue to monitor BP.   UTI: Urine culture pending, Continue tx with Keflex 500mg  BID x 7 days   Gallstones: US IMPRESSION: Cholelithiasis and sludge without secondary signs to suggest acute cholecystitis  Social work consult 11/25  BMZ complete 11/25.   GBS + on 11/23.   Will give Report and update Dr Estanislado Pandy at 0700.     Farra Nikolic CNM, NP-C 01/28/2018

## 2018-01-29 ENCOUNTER — Inpatient Hospital Stay (HOSPITAL_BASED_OUTPATIENT_CLINIC_OR_DEPARTMENT_OTHER): Payer: Medicaid Other

## 2018-01-29 DIAGNOSIS — Z3A31 31 weeks gestation of pregnancy: Secondary | ICD-10-CM

## 2018-01-29 DIAGNOSIS — O30043 Twin pregnancy, dichorionic/diamniotic, third trimester: Secondary | ICD-10-CM

## 2018-01-29 DIAGNOSIS — O24113 Pre-existing diabetes mellitus, type 2, in pregnancy, third trimester: Secondary | ICD-10-CM

## 2018-01-29 DIAGNOSIS — O99213 Obesity complicating pregnancy, third trimester: Secondary | ICD-10-CM

## 2018-01-29 DIAGNOSIS — O289 Unspecified abnormal findings on antenatal screening of mother: Secondary | ICD-10-CM

## 2018-01-29 LAB — COMPREHENSIVE METABOLIC PANEL
ALBUMIN: 2.2 g/dL — AB (ref 3.5–5.0)
ALT: 8 U/L (ref 0–44)
ANION GAP: 7 (ref 5–15)
AST: 13 U/L — AB (ref 15–41)
Alkaline Phosphatase: 69 U/L (ref 38–126)
BILIRUBIN TOTAL: 0.5 mg/dL (ref 0.3–1.2)
BUN: 8 mg/dL (ref 6–20)
CO2: 25 mmol/L (ref 22–32)
Calcium: 8.4 mg/dL — ABNORMAL LOW (ref 8.9–10.3)
Chloride: 100 mmol/L (ref 98–111)
Creatinine, Ser: 0.4 mg/dL — ABNORMAL LOW (ref 0.44–1.00)
GFR calc Af Amer: >60 mL/min (ref >60)
GFR calc non Af Amer: >60 mL/min (ref >60)
GLUCOSE: 125 mg/dL — AB (ref 70–99)
Potassium: 3.9 mmol/L (ref 3.5–5.1)
Sodium: 132 mmol/L — ABNORMAL LOW (ref 135–145)
TOTAL PROTEIN: 5.4 g/dL — AB (ref 6.5–8.1)

## 2018-01-29 LAB — CBC
HCT: 38.7 % (ref 36.0–46.0)
Hemoglobin: 13.4 g/dL (ref 12.0–15.0)
MCH: 31.5 pg (ref 26.0–34.0)
MCHC: 34.6 g/dL (ref 30.0–36.0)
MCV: 90.8 fL (ref 80.0–100.0)
Platelets: 181 10*3/uL (ref 150–400)
RBC: 4.26 MIL/uL (ref 3.87–5.11)
RDW: 13.4 % (ref 11.5–15.5)
WBC: 10 10*3/uL (ref 4.0–10.5)
nRBC: 0 % (ref 0.0–0.2)

## 2018-01-29 LAB — PROTEIN / CREATININE RATIO, URINE
Creatinine, Urine: 67 mg/dL
PROTEIN CREATININE RATIO: 0.15 mg/mg{creat} (ref 0.00–0.15)
TOTAL PROTEIN, URINE: 10 mg/dL

## 2018-01-29 LAB — CULTURE, OB URINE

## 2018-01-29 LAB — URIC ACID: Uric Acid, Serum: 3.1 mg/dL (ref 2.5–7.1)

## 2018-01-29 LAB — GLUCOSE, CAPILLARY
GLUCOSE-CAPILLARY: 119 mg/dL — AB (ref 70–99)
Glucose-Capillary: 146 mg/dL — ABNORMAL HIGH (ref 70–99)
Glucose-Capillary: 107 mg/dL — ABNORMAL HIGH (ref 70–99)

## 2018-01-29 NOTE — Progress Notes (Signed)
1540 Baby A tracing orange Baby B tracing as blue  Dr. Su Hiltoberts in department, aware of difficulty finding B.   1551 Not tracing Baby B- artifact noted on monitor   1558 Bedside US by Dr Su Hiltoberts  1640 Dr Su Hiltoberts notified of continued fetal movement and inability to trace babies- pt refusing to monitor-  New orders: follow up BPP in the am

## 2018-01-30 ENCOUNTER — Inpatient Hospital Stay (HOSPITAL_BASED_OUTPATIENT_CLINIC_OR_DEPARTMENT_OTHER): Payer: Medicaid Other

## 2018-01-30 DIAGNOSIS — O24313 Unspecified pre-existing diabetes mellitus in pregnancy, third trimester: Secondary | ICD-10-CM

## 2018-01-30 DIAGNOSIS — O30043 Twin pregnancy, dichorionic/diamniotic, third trimester: Secondary | ICD-10-CM

## 2018-01-30 DIAGNOSIS — K8020 Calculus of gallbladder without cholecystitis without obstruction: Secondary | ICD-10-CM

## 2018-01-30 DIAGNOSIS — Z3A31 31 weeks gestation of pregnancy: Secondary | ICD-10-CM

## 2018-01-30 DIAGNOSIS — N390 Urinary tract infection, site not specified: Secondary | ICD-10-CM

## 2018-01-30 DIAGNOSIS — O24119 Pre-existing diabetes mellitus, type 2, in pregnancy, unspecified trimester: Secondary | ICD-10-CM

## 2018-01-30 LAB — GLUCOSE, CAPILLARY
Glucose-Capillary: 72 mg/dL (ref 70–99)
Glucose-Capillary: 90 mg/dL (ref 70–99)

## 2018-01-30 MED ORDER — CEPHALEXIN 500 MG PO CAPS: 500.0000 mg | ORAL_CAPSULE | Freq: Two times a day (BID) | ORAL | 0 refills | Status: AC

## 2018-01-30 MED ORDER — ACCU-CHEK MULTICLIX LANCETS MISC: 5 refills | Status: DC

## 2018-01-30 MED ORDER — INSULIN ASPART 100 UNIT/ML ~~LOC~~ SOLN: 8.0000 [IU] | Freq: Three times a day (TID) | SUBCUTANEOUS | 11 refills | Status: DC

## 2018-01-30 MED ORDER — INSULIN NPH (HUMAN) (ISOPHANE) 100 UNIT/ML ~~LOC~~ SUSP: 20.0000 [IU] | Freq: Two times a day (BID) | SUBCUTANEOUS | 11 refills | Status: DC

## 2018-01-30 MED ORDER — INSULIN ASPART 100 UNIT/ML ~~LOC~~ SOLN: 0.0000 [IU] | Freq: Three times a day (TID) | SUBCUTANEOUS | 11 refills | Status: DC

## 2018-01-30 NOTE — Progress Notes (Signed)
Discussed discharge instructions including insulin administration. Reviewed insulin instructions with teach back.  All other medications discussed follow up appointments discussed.

## 2018-01-30 NOTE — Discharge Summary (Signed)
Antepartum OB Discharge Summary     Patient Name: Ellen Martin DOB: 29-Oct-1985 MRN: [id number]  Date of admission: 01/25/2018 Delivering MD: This patient has no babies on file. Date of Admission: 01/25/2018 Type of Admission:  1. Continuous RUQ abdominal pain   2. Dichorionic diamniotic twin pregnancy in third trimester   3. Abdominal pain during pregnancy, third trimester   4. Type 2 diabetes mellitus affecting pregnancy in third trimester, antepartum   5. Right upper quadrant pain   6. Diabetes type 2, uncontrolled (HCC)   7. Gestational diabetes   8. DM (diabetes mellitus) (HCC)    Admitting diagnosis: 32wks Severe Pain Intrauterine pregnancy: [redacted]w[redacted]d     Secondary diagnosis:  Active Problems:   Dichorionic diamniotic twin pregnancy in third trimester   Gestational hypertension   UTI (urinary tract infection)   Pre-existing type 2 diabetes affecting pregnancy, antepartum   Gallstone                               History of Present Illness: Ms. Ellen Martin is a 32 y.o. female, [phone], who presents at [redacted]w[redacted]d weeks gestation. The patient has been followed at  [facility] and Gynecology  Her pregnancy has been complicated by:  Patient Active Problem List   Diagnosis Date Noted  . UTI (urinary tract infection) 01/30/2018  . Pre-existing type 2 diabetes affecting pregnancy, antepartum 01/30/2018  . Gallstone 01/30/2018  . Gestational hypertension 01/28/2018  . Dichorionic diamniotic twin pregnancy in third trimester 01/25/2018   Hospital course:   LOS#5 Pt admitted for right upper quad pain, present with elevated BP, neg pre eclampsia labs on admission, Abdominal US showed gallstones. Pt stable and being monitor for insulin being started due to non compliance of DM2. Pt has not being showing up for her prenatal appointments with [facility], and has not started on insulin although asked to do so earlier in pregnancy, pt was titrated up on insulin to 20units BID of  NPH, 8units novolog with meals and diabetic sliding scale, pt Received x2 doses of BMZ which were subsequently increasing her BS. Pt BS became stable. DiDi twin were monitor with BPP daily BPP were all 8/8 for both twin s except twin B has a 6/10 one day ago.  Todays BPP for twin A was transverse lie, HR 148, NST reactive, AFI WNL score 8/8, and twin B was HR 124, NST reactive, breech, AFI normal and score of 6/8. Pt to be f/u in office and get repeat BPP weekly with provider appointments. Pt discharge with expression of importance on compliance with insulin, blood sugar log, and meds to be take. Pt to monitor BP at home. Pt also has UTI x week ago and never took her abx, pt was prescribed and took three days of keflex for ecoli infection and was sent home with another four days.   Physical exam  Vitals:   01/29/18 2334 01/30/18 0410 01/30/18 0806 01/30/18 1211  BP: 118/69 115/70 120/88 121/73  Pulse: 79 77 95 85  Resp: 17 18 18 18   Temp: 98.3 F (36.8 C) 98 F (36.7 C) 97.6 F (36.4 C) 97.8 F (36.6 C)  TempSrc: Oral Oral Oral Oral  SpO2: 95% 99% 100%   Weight:      Height:       General: alert, cooperative and no distress Lochia: appropriate Uterine Fundus: firm DVT Evaluation: No evidence of DVT seen on physical exam. Negative  Homan's sign. No cords or calf tenderness. No significant calf/ankle edema.  Labs: Lab Results  Component Value Date   WBC 10.0 01/29/2018   HGB 13.4 01/29/2018   HCT 38.7 01/29/2018   MCV 90.8 01/29/2018   PLT 181 01/29/2018   CMP Latest Ref Rng & Units 01/29/2018  Glucose 70 - 99 mg/dL 161(W)  BUN 6 - 20 mg/dL 8  Creatinine 9.60 - 4.54 mg/dL 0.98(J)  Sodium 191 - 478 mmol/L 132(L)  Potassium 3.5 - 5.1 mmol/L 3.9  Chloride 98 - 111 mmol/L 100  CO2 22 - 32 mmol/L 25  Calcium 8.9 - 10.3 mg/dL 2.9(F)  Total Protein 6.5 - 8.1 g/dL 6.2(Z)  Total Bilirubin 0.3 - 1.2 mg/dL 0.5  Alkaline Phos 38 - 126 U/L 69  AST 15 - 41 U/L 13(L)  ALT 0 - 44 U/L 8     Date of discharge: 01/30/2018 Discharge Diagnoses:  Patient Active Problem List   Diagnosis Date Noted  . UTI (urinary tract infection) 01/30/2018  . Pre-existing type 2 diabetes affecting pregnancy, antepartum 01/30/2018  . Gallstone 01/30/2018  . Gestational hypertension 01/28/2018  . Dichorionic diamniotic twin pregnancy in third trimester 01/25/2018   Discharge instruction: per After Visit Summary and "Baby and Me Booklet".  After visit meds:  Allergies as of 01/30/2018   No Known Allergies     Medication List    STOP taking these medications   amoxicillin 500 MG capsule Commonly known as:  AMOXIL     TAKE these medications   accu-chek multiclix lancets Use as directed   cephALEXin 500 MG capsule Commonly known as:  KEFLEX Take 1 capsule (500 mg total) by mouth every 12 (twelve) hours for 4 days.   insulin aspart 100 UNIT/ML injection Commonly known as:  novoLOG Inject 0-14 Units into the skin 3 (three) times daily after meals. Question Answer Comment CBG < 60: call physician  CBG 70 - 90 (dose in units): 0  CBG 91 - 120 (dose in units): 1  CBG 121 - 160 (dose in units): 2  CBG 161 - 200 (dose in units): 3  CBG 201 - 240 (dose in units): 4  CBG 241 - 280 (dose in units): 6  CBG 281 - 320 (dose in units): 8  CBG 321 - 360 (dose in units): 10  CBG 361 - 400 (dose in units): 12  CBG > 400 14 units and call physician   insulin aspart 100 UNIT/ML injection Commonly known as:  novoLOG Inject 8 Units into the skin 3 (three) times daily with meals.   insulin NPH Human 100 UNIT/ML injection Commonly known as:  HUMULIN N,NOVOLIN N Inject into the skin. What changed:  Another medication with the same name was added. Make sure you understand how and when to take each.   insulin NPH Human 100 UNIT/ML injection Commonly known as:  HUMULIN N,NOVOLIN N Inject 0.2 mLs (20 Units total) into the skin 2 (two) times daily at 8 am and 10 pm. What changed:  You were  already taking a medication with the same name, and this prescription was added. Make sure you understand how and when to take each.   insulin regular 100 units/mL injection Commonly known as:  NOVOLIN R,HUMULIN R Inject 10 Units into the skin daily.   metoCLOPramide 10 MG tablet Commonly known as:  REGLAN Take 1 tablet (10 mg total) by mouth every 6 (six) hours as needed for nausea (nausea/headache).       [place]  Mfm Fetal Bpp Wo Non Stress  Result Date: 01/30/2018 ----------------------------------------------------------------------  OBSTETRICS REPORT                        (Signed Final 01/30/2018 10:52 am) ---------------------------------------------------------------------- Patient Info  ID #:       [id number]                          D.O.B.:  1985-08-08 (32 yrs)  Name:       Ellen Martin                  Visit Date: 01/30/2018 07:08 am ---------------------------------------------------------------------- Performed By  Performed By:     Tomma Lightning             Ref. Address:      [facility]                    RDMS,RVT                                                              Obstetrics &                                                              Gynecology                                                              [address]                                                              [address]                                                              [place], [place]  [address]  Attending:        Lin Landsman      Secondary Phy.:    3rd Nursing- HR                    MD                                                              OB                                                              3rd Floor  Referred By:      Nigel Bridgeman           Location:          [facility]                    CNM  ---------------------------------------------------------------------- Orders   #  Description                          Code         Ordered By   1  [place] MFM FETAL BPP WO NON              76819.01     ANGELA ROBERTS      STRESS   2  [place] MFM FETAL BPP WO NST              62130.8      ANGELA ROBERTS      ADDL GESTATION  ----------------------------------------------------------------------   #  Order #                    Accession #                 Episode #   1  [id number]                  [id number]                  [id number]   2  [id number]                  [id number]                  [id number]  ---------------------------------------------------------------------- Indications   [redacted] weeks gestation of pregnancy                Z3A.31  ---------------------------------------------------------------------- Vital Signs  Weight (lb): 283                               Height:        5'9"  BMI:         41.79 ---------------------------------------------------------------------- Fetal Evaluation (Fetus A)  Num Of Fetuses:          2  Fetal Heart Rate(bpm):   148  Cardiac Activity:        Observed  Fetal Lie:  Maternal right side  Presentation:            Transverse, head to maternal left  Placenta:                Posterior  Membrane Desc:      Dividing Membrane seen  Amniotic Fluid  AFI FV:      Within normal limits                              Largest Pocket(cm)                              4.4 ---------------------------------------------------------------------- Biophysical Evaluation (Fetus A)  Amniotic F.V:   Within normal limits       F. Tone:         Observed  F. Movement:    Observed                   Score:           8/8  F. Breathing:   Observed ---------------------------------------------------------------------- OB History  Gravidity:    7         Term:   3        Prem:   0        SAB:   3  TOP:          0       Ectopic:  0        Living: 3  ---------------------------------------------------------------------- Gestational Age (Fetus A)  Best:          31w 6d     Det. By:  Marcella Dubs         EDD:   03/28/18 ---------------------------------------------------------------------- Fetal Evaluation (Fetus B)  Num Of Fetuses:          2  Fetal Heart Rate(bpm):   124  Cardiac Activity:        Observed  Fetal Lie:               Maternal left side  Presentation:            Breech  Placenta:                Posterior  Amniotic Fluid  AFI FV:      Within normal limits                              Largest Pocket(cm)                              4.31 ---------------------------------------------------------------------- Biophysical Evaluation (Fetus B)  Amniotic F.V:   Within normal limits       F. Tone:         Observed  F. Movement:    Observed                   Score:           6/8  F. Breathing:   Not Observed ---------------------------------------------------------------------- Gestational Age (Fetus B)  Best:          31w 6d     Det. By:  Marcella Dubs         EDD:   03/28/18 ---------------------------------------------------------------------- Cervix Uterus Adnexa  Cervix  Not visualized (advanced GA >24wks) ----------------------------------------------------------------------  Impression  Biophysical profile 8/8 Twin A  Twin B 6/8 ---------------------------------------------------------------------- Recommendations  Follow up as clinically indicated.  Follow up NST on twin B or repeat BPP in 24 hours ----------------------------------------------------------------------               Lin Landsman, MD Electronically Signed Final Report   01/30/2018 10:52 am ----------------------------------------------------------------------  [place] Mfm Fetal Bpp Wo Non Stress  Result Date: 01/29/2018 ----------------------------------------------------------------------  OBSTETRICS REPORT                       (Signed Final 01/29/2018 02:38 pm)  ---------------------------------------------------------------------- Patient Info  ID #:       [id number]                          D.O.B.:  May 01, 1985 (32 yrs)  Name:       Ellen Martin                  Visit Date: 01/29/2018 01:28 pm ---------------------------------------------------------------------- Performed By  Performed By:     Eden Lathe BS      Ref. Address:     [facility]                    RDMS RVT                                                             Obstetrics &                                                             Gynecology                                                             [address]                                                             [address]                                                             [place], [place]  [address]  Attending:        Noralee Space MD        Secondary Phy.:   3rd Nursing- HR                                                             OB                                                             3rd Floor  Referred By:      Nigel Bridgeman           Location:         [facility]                    CNM ---------------------------------------------------------------------- Orders   #  Description                          Code         Ordered By   1  [place] MFM FETAL BPP WO NON              76819.01     ANGELA ROBERTS      STRESS   2  [place] MFM FETAL BPP WO NST              76819.1      ANGELA ROBERTS      ADDL GESTATION  ----------------------------------------------------------------------   #  Order #                    Accession #                 Episode #   1  [id number]                  [id number]                  [id number]   2  [id number]                  [id number]                  [id number]  ---------------------------------------------------------------------- Indications   [redacted] weeks gestation of pregnancy                 Z3A.31   Non-reactive NST                               O28.9   Twin pregnancy, di/di, third trimester         O30.043   Pre-existing diabetes, type 2, in pregnancy,   O24.113   third trimester   Obesity complicating pregnancy, third          O99.213   trimester   Abnormal biochemical screen (Panorama-         O28.9   Insufficient fetal DNA FF 2.0%, 1.2%)  ---------------------------------------------------------------------- Vital Signs  Height:        5'9" ---------------------------------------------------------------------- Fetal Evaluation (Fetus A)  Num Of Fetuses:         2  Fetal Heart Rate(bpm):  136  Cardiac Activity:       Observed  Fetal Lie:              Maternal right side  Presentation:           Breech  Placenta:               Posterior  Amniotic Fluid  AFI FV:      Within normal limits                              Largest Pocket(cm)                              8 ---------------------------------------------------------------------- Biophysical Evaluation (Fetus A)  Amniotic F.V:   Within normal limits       F. Tone:        Observed  F. Movement:    Observed                   Score:          8/8  F. Breathing:   Observed ---------------------------------------------------------------------- OB History  Gravidity:    7         Term:   3        Prem:   0        SAB:   3  TOP:          0       Ectopic:  0        Living: 3 ---------------------------------------------------------------------- Gestational Age (Fetus A)  Best:          31w 5d     Det. By:  Marcella Dubs         EDD:   03/28/18 ---------------------------------------------------------------------- Fetal Evaluation (Fetus B)  Num Of Fetuses:         2  Fetal Heart Rate(bpm):  148  Cardiac Activity:       Observed  Fetal Lie:              Maternal left side  Presentation:           Cephalic  Placenta:               Posterior                              Largest Pocket(cm)                               5.43 ---------------------------------------------------------------------- Biophysical Evaluation (Fetus B)  Amniotic F.V:   Within normal limits       F. Tone:        Observed  F. Movement:    Observed                   Score:          6/8  F. Breathing:   Not Observed ---------------------------------------------------------------------- Gestational Age (Fetus B)  Best:          31w 5d     Det. By:  Marcella Dubs  EDD:   03/28/18 ---------------------------------------------------------------------- Impression  Ms. Dobie is here for BPP because of nonreactive NST.  Blood glucose levels are within normal limits. No evidence of  hypertension.  Twin A: Maternal right, breech, posterior placenta. Amniotic  fluid is normal and good fetal activity is seen. BPP 8/8.  Twin B: Maternal left, cephalic, posterior placenta. Amniotic  fluid is normal and good fetal activity is seen. Fetal breathing  movements did not meet the criteria. BPP 6/8. ---------------------------------------------------------------------- Recommendations  -If repeat NST today is reactive, you may consider discharge  with close follow-up at your office.  -If nonreactive (especially twin B), repeat BPP tomorrow. ----------------------------------------------------------------------                  Noralee Space, MD Electronically Signed Final Report   01/29/2018 02:38 pm ----------------------------------------------------------------------  [place] Mfm Ob Follow Up  Result Date: 01/27/2018 ----------------------------------------------------------------------  OBSTETRICS REPORT                       (Signed Final 01/27/2018 04:36 pm) ---------------------------------------------------------------------- Patient Info  ID #:       [id number]                          D.O.B.:  [birth year] (32 yrs)  Name:       Ellen Martin                  Visit Date: 01/27/2018 01:11 pm  ---------------------------------------------------------------------- Performed By  Performed By:     Lenise Arena        Ref. Address:     [facility]                                                             Obstetrics &                                                             Gynecology                                                             [address]  [address]                                                             [place], [place]                                                             [address]  Attending:        Noralee Space MD        Secondary Phy.:   L&D Nursing- L/D                                                             [id number]  Referred By:      Nigel Bridgeman           Location:         [facility]                    CNM ---------------------------------------------------------------------- Orders   #  Description                          Code         Ordered By   1  [place] MFM OB FOLLOW UP ADDL             13244.01     CAROL HARTSOG      GEST   2  [place] MFM OB FOLLOW UP                  02725.36     [facility]  ----------------------------------------------------------------------   #  Order #                    Accession #                 Episode #   1  [id number]                  [id number]                  [id number]   2  [id number]                  [id number]                  [id number]  ---------------------------------------------------------------------- Indications   [redacted] weeks gestation of pregnancy                Z3A.31   Abdominal pain in pregnancy                    O99.89   Twin pregnancy, di/di, second trimester        O30.042   Pre-existing diabetes, type 2, in pregnancy,   O24.112   second trimester (Insulin)   Obesity complicating pregnancy, second  W09.811   trimester (BMI 43)   Abnormal biochemical screen  (Panorama-         O28.9   Insufficient fetal DNA FF 2.0%, 1.2%)  ---------------------------------------------------------------------- Vital Signs  Weight (lb): 283                               Height:        5'9"  BMI:         41.79 ---------------------------------------------------------------------- Fetal Evaluation (Fetus A)  Num Of Fetuses:         2  Fetal Heart Rate(bpm):  137  Cardiac Activity:       Observed  Fetal Lie:              Maternal right side  Presentation:           Breech  Placenta:               Posterior  Amniotic Fluid  AFI FV:      Within normal limits                              Largest Pocket(cm)                              5.83 ---------------------------------------------------------------------- Biometry (Fetus A)  BPD:      74.3  mm     G. Age:  29w 6d          5  %    CI:        73.81   %    70 - 86                                                          FL/HC:      21.3   %    19.3 - 21.3  HC:      274.7  mm     G. Age:  30w 0d        < 3  %    HC/AC:      0.97        0.96 - 1.17  AC:      283.3  mm     G. Age:  32w 2d         75  %    FL/BPD:     78.7   %    71 - 87  FL:       58.5  mm     G. Age:  30w 4d         17  %    FL/AC:      20.6   %    20 - 24  HUM:      49.4  mm     G. Age:  29w 0d        < 5  %  LV:        3.7  mm  Est. FW:    1751  gm    3 lb 14 oz      54  %     FW Discordancy     0 \  16 % ---------------------------------------------------------------------- OB History  Gravidity:    7         Term:   3        Prem:   0        SAB:   3  TOP:          0       Ectopic:  0        Living: 3 ---------------------------------------------------------------------- Gestational Age (Fetus A)  U/S Today:     30w 5d                                        EDD:   04/02/18  Best:          31w 3d     Det. By:  Marcella Dubs         EDD:   03/28/18 ---------------------------------------------------------------------- Anatomy (Fetus A)  Cranium:               Appears normal          Aortic Arch:            Previously seen  Cavum:                 Appears normal         Ductal Arch:            Not well visualized  Ventricles:            Appears normal         Diaphragm:              Previously seen  Choroid Plexus:        Previously seen        Stomach:                Appears normal, left                                                                        sided  Cerebellum:            Previously seen        Abdomen:                Appears normal  Posterior Fossa:       Previously seen        Abdominal Wall:         Previously seen  Nuchal Fold:           Not applicable (>20    Cord Vessels:           Previously seen                         wks GA)  Face:                  Orbits and profile     Kidneys:                Appear normal  previously seen  Lips:                  Previously seen        Bladder:                Appears normal  Thoracic:              Appears normal         Spine:                  Ltd views no                                                                        intracranial signs of                                                                        NT  Heart:                 Previously seen        Upper Extremities:      Previously seen  RVOT:                  Appears normal         Lower Extremities:      Previously seen  LVOT:                  Not well visualized  Other:  Fetus appears to be a female. Technically difficult due to maternal          habitus and fetal position. ---------------------------------------------------------------------- Fetal Evaluation (Fetus B)  Num Of Fetuses:         2  Fetal Heart Rate(bpm):  157  Cardiac Activity:       Observed  Fetal Lie:              Maternal left side  Presentation:           Breech  Placenta:               Posterior  Membrane Desc:      Dividing Membrane seen  Amniotic Fluid  AFI FV:      Within normal limits                              Largest Pocket(cm)                               4.39 ---------------------------------------------------------------------- Biometry (Fetus B)  BPD:      77.3  mm     G. Age:  31w 0d         27  %    CI:        74.67   %    70 - 86  FL/HC:      18.2   %    19.3 - 21.3  HC:      283.9  mm     G. Age:  31w 1d         11  %    HC/AC:      1.06        0.96 - 1.17  AC:      268.1  mm     G. Age:  30w 6d         33  %    FL/BPD:     66.8   %    71 - 87  FL:       51.6  mm     G. Age:  27w 4d        < 3  %    FL/AC:      19.2   %    20 - 24  HUM:      47.8  mm     G. Age:  28w 0d        < 5  %  LV:        5.9  mm  Est. FW:    1473  gm      3 lb 4 oz     25  %     FW Discordancy        16  % ---------------------------------------------------------------------- Gestational Age (Fetus B)  U/S Today:     30w 1d                                        EDD:   04/06/18  Best:          31w 3d     Det. By:  Marcella Dubs         EDD:   03/28/18 ---------------------------------------------------------------------- Anatomy (Fetus B)  Cranium:               Appears normal         Aortic Arch:            Not well visualized  Cavum:                 Appears normal         Ductal Arch:            Not well visualized  Ventricles:            Appears normal         Diaphragm:              Appears normal  Choroid Plexus:        Not well visualized    Stomach:                Appears normal, left                                                                        sided  Cerebellum:            Previously seen        Abdomen:  Appears normal  Posterior Fossa:       Previously seen        Abdominal Wall:         Previously seen  Nuchal Fold:           Not applicable (>20    Cord Vessels:           2 vessel cord,                         wks GA)                                                                        absent right umb                                                                        art  Face:                   Profile previously     Kidneys:                See comments                         seen  Lips:                  Previously seen        Bladder:                Appears normal  Thoracic:              Appears normal         Spine:                  Ltd views no                                                                        intracranial signs of                                                                        NT  Heart:                 Not well visualized    Upper Extremities:      Previously seen  RVOT:                  Appears normal  Lower Extremities:      Previously seen  LVOT:                  Appears normal  Other:  Fetus appears to be female. Nasal bone visualized. Open Rt hand          seen. Technically difficult due to maternal habitus and fetal position. ---------------------------------------------------------------------- Cervix Uterus Adnexa  Cervix  Not visualized (advanced GA >24wks) ---------------------------------------------------------------------- Impression  Dichorionic-diamniotic twin pregnancy.  Twin A: Maternal right, breech, posterior placenta. Fetal  growth is appropriate for gestational age. Amniotic fluid is  normal and good fetal activity is seen.  Twin B: Maternal left, breech (initially in the cephalic  position), posterior placenta. Fetal growth is appropriate for  gestational age. Amniotic fluid is normal and good fetal  activity is seen. Signficant findings include a) single umbilical  artery, b) slight echogenicity of right kidney. Right kidney,  otherwise, looks normal with urinary tract dilation or cysts.  Left kidney appears normal.  Growth discordancy: 16% (normal).  See consultation note in EPIC. ---------------------------------------------------------------------- Recommendations  -Consider increasing insulin NPH to 20 units in the morning  and 20 units at night.  -Add Humalog 4 units with each meal and increase as  needed.  -Check fasting and  postprandial (1 or 2 hours) blood glucose.  -Hb A1C with next blood draw.  -BP monitoring as per protocol.  -Weekly BPP and NST.  --Antibiotics if UTI is not treated. ----------------------------------------------------------------------                  Noralee Spaceavi Shankar, MD Electronically Signed Final Report   01/27/2018 04:36 pm ----------------------------------------------------------------------  [place] Mfm Ob Follow Up Addl Gest  Result Date: 01/27/2018 ----------------------------------------------------------------------  OBSTETRICS REPORT                       (Signed Final 01/27/2018 04:36 pm) ---------------------------------------------------------------------- Patient Info  ID #:       [id number]                          D.O.B.:  September 19, 1985 (32 yrs)  Name:       Ellen KaufmannJENNIFER Martin                  Visit Date: 01/27/2018 01:11 pm ---------------------------------------------------------------------- Performed By  Performed By:     Lenise ArenaHannah Bazemore        Ref. Address:     [facility]                    RDMS                                                             Obstetrics &                                                             Gynecology  [address]                                                             [address]                                                             [place], [place]                                                             [address]  Attending:        Noralee Space MD        Secondary Phy.:   L&D Nursing- L/D                                                             [id number]  Referred By:      Nigel Bridgeman           [facility]                    [facility] ---------------------------------------------------------------------- Orders   #  Description                          Code         Ordered By   1  [place] MFM OB FOLLOW UP ADDL              60454.09     CAROL HARTSOG      GEST   2  [place] MFM OB FOLLOW UP                  81191.47     [facility]  ----------------------------------------------------------------------   #  Order #                    Accession #                 Episode #   1  [id number]                  [id number]                  [id number]   2  [id number]  [id number]                  [id number]  ---------------------------------------------------------------------- Indications   [redacted] weeks gestation of pregnancy                Z3A.31   Abdominal pain in pregnancy                    O99.89   Twin pregnancy, di/di, second trimester        O30.042   Pre-existing diabetes, type 2, in pregnancy,   O24.112   second trimester (Insulin)   Obesity complicating pregnancy, second         O99.212   trimester (BMI 43)   Abnormal biochemical screen (Panorama-         O28.9   Insufficient fetal DNA FF 2.0%, 1.2%)  ---------------------------------------------------------------------- Vital Signs  Weight (lb): 283                               Height:        5'9"  BMI:         41.79 ---------------------------------------------------------------------- Fetal Evaluation (Fetus A)  Num Of Fetuses:         2  Fetal Heart Rate(bpm):  137  Cardiac Activity:       Observed  Fetal Lie:              Maternal right side  Presentation:           Breech  Placenta:               Posterior  Amniotic Fluid  AFI FV:      Within normal limits                              Largest Pocket(cm)                              5.83 ---------------------------------------------------------------------- Biometry (Fetus A)  BPD:      74.3  mm     G. Age:  29w 6d          5  %    CI:        73.81   %    70 - 86                                                          FL/HC:      21.3   %    19.3 - 21.3  HC:      274.7  mm     G. Age:  30w 0d        < 3  %    HC/AC:      0.97        0.96 - 1.17  AC:      283.3  mm     G. Age:  32w 2d         75  %    FL/BPD:      78.7   %    71 - 87  FL:       58.5  mm  G. Age:  30w 4d         17  %    FL/AC:      20.6   %    20 - 24  HUM:      49.4  mm     G. Age:  29w 0d        < 5  %  LV:        3.7  mm  Est. FW:    1751  gm    3 lb 14 oz      54  %     FW Discordancy     0 \ 16 % ---------------------------------------------------------------------- OB History  Gravidity:    7         Term:   3        Prem:   0        SAB:   3  TOP:          0       Ectopic:  0        Living: 3 ---------------------------------------------------------------------- Gestational Age (Fetus A)  U/S Today:     30w 5d                                        EDD:   04/02/18  Best:          31w 3d     Det. By:  Marcella Dubs         EDD:   03/28/18 ---------------------------------------------------------------------- Anatomy (Fetus A)  Cranium:               Appears normal         Aortic Arch:            Previously seen  Cavum:                 Appears normal         Ductal Arch:            Not well visualized  Ventricles:            Appears normal         Diaphragm:              Previously seen  Choroid Plexus:        Previously seen        Stomach:                Appears normal, left                                                                        sided  Cerebellum:            Previously seen        Abdomen:                Appears normal  Posterior Fossa:       Previously seen        Abdominal Wall:         Previously seen  Nuchal Fold:  Not applicable (>20    Cord Vessels:           Previously seen                         wks GA)  Face:                  Orbits and profile     Kidneys:                Appear normal                         previously seen  Lips:                  Previously seen        Bladder:                Appears normal  Thoracic:              Appears normal         Spine:                  Ltd views no                                                                        intracranial signs of                                                                         NT  Heart:                 Previously seen        Upper Extremities:      Previously seen  RVOT:                  Appears normal         Lower Extremities:      Previously seen  LVOT:                  Not well visualized  Other:  Fetus appears to be a female. Technically difficult due to maternal          habitus and fetal position. ---------------------------------------------------------------------- Fetal Evaluation (Fetus B)  Num Of Fetuses:         2  Fetal Heart Rate(bpm):  157  Cardiac Activity:       Observed  Fetal Lie:              Maternal left side  Presentation:           Breech  Placenta:               Posterior  Membrane Desc:      Dividing Membrane seen  Amniotic Fluid  AFI FV:      Within normal limits  Largest Pocket(cm)                              4.39 ---------------------------------------------------------------------- Biometry (Fetus B)  BPD:      77.3  mm     G. Age:  31w 0d         27  %    CI:        74.67   %    70 - 86                                                          FL/HC:      18.2   %    19.3 - 21.3  HC:      283.9  mm     G. Age:  31w 1d         11  %    HC/AC:      1.06        0.96 - 1.17  AC:      268.1  mm     G. Age:  30w 6d         33  %    FL/BPD:     66.8   %    71 - 87  FL:       51.6  mm     G. Age:  27w 4d        < 3  %    FL/AC:      19.2   %    20 - 24  HUM:      47.8  mm     G. Age:  28w 0d        < 5  %  LV:        5.9  mm  Est. FW:    1473  gm      3 lb 4 oz     25  %     FW Discordancy        16  % ---------------------------------------------------------------------- Gestational Age (Fetus B)  U/S Today:     30w 1d                                        EDD:   04/06/18  Best:          31w 3d     Det. By:  Marcella Dubs         EDD:   03/28/18 ---------------------------------------------------------------------- Anatomy (Fetus B)  Cranium:               Appears normal         Aortic Arch:             Not well visualized  Cavum:                 Appears normal         Ductal Arch:            Not well visualized  Ventricles:            Appears normal         Diaphragm:  Appears normal  Choroid Plexus:        Not well visualized    Stomach:                Appears normal, left                                                                        sided  Cerebellum:            Previously seen        Abdomen:                Appears normal  Posterior Fossa:       Previously seen        Abdominal Wall:         Previously seen  Nuchal Fold:           Not applicable (>20    Cord Vessels:           2 vessel cord,                         wks GA)                                                                        absent right umb                                                                        art  Face:                  Profile previously     Kidneys:                See comments                         seen  Lips:                  Previously seen        Bladder:                Appears normal  Thoracic:              Appears normal         Spine:                  Ltd views no  intracranial signs of                                                                        NT  Heart:                 Not well visualized    Upper Extremities:      Previously seen  RVOT:                  Appears normal         Lower Extremities:      Previously seen  LVOT:                  Appears normal  Other:  Fetus appears to be female. Nasal bone visualized. Open Rt hand          seen. Technically difficult due to maternal habitus and fetal position. ---------------------------------------------------------------------- Cervix Uterus Adnexa  Cervix  Not visualized (advanced GA >24wks) ---------------------------------------------------------------------- Impression  Dichorionic-diamniotic twin pregnancy.  Twin A: Maternal right, breech, posterior  placenta. Fetal  growth is appropriate for gestational age. Amniotic fluid is  normal and good fetal activity is seen.  Twin B: Maternal left, breech (initially in the cephalic  position), posterior placenta. Fetal growth is appropriate for  gestational age. Amniotic fluid is normal and good fetal  activity is seen. Signficant findings include a) single umbilical  artery, b) slight echogenicity of right kidney. Right kidney,  otherwise, looks normal with urinary tract dilation or cysts.  Left kidney appears normal.  Growth discordancy: 16% (normal).  See consultation note in EPIC. ---------------------------------------------------------------------- Recommendations  -Consider increasing insulin NPH to 20 units in the morning  and 20 units at night.  -Add Humalog 4 units with each meal and increase as  needed.  -Check fasting and postprandial (1 or 2 hours) blood glucose.  -Hb A1C with next blood draw.  -BP monitoring as per protocol.  -Weekly BPP and NST.  --Antibiotics if UTI is not treated. ----------------------------------------------------------------------                  Noralee Space, MD Electronically Signed Final Report   01/27/2018 04:36 pm ----------------------------------------------------------------------  [place] Mfm Ob Limited  Result Date: 01/25/2018 ----------------------------------------------------------------------  OBSTETRICS REPORT                       (Signed Final 01/25/2018 02:42 pm) ---------------------------------------------------------------------- Patient Info  ID #:       [id number]                          D.O.B.:  [birth year] (32 yrs)  Name:       Ellen Martin                  Visit Date: 01/25/2018 10:43 am ---------------------------------------------------------------------- Performed By  Performed By:     Birdena Crandall        Ref. Address:      [facility]                    RDMS,RVT  Obstetrics &                                                               Gynecology                                                              [address]                                                              [address]                                                              [place], [place]                                                              [address]  Attending:        Noralee Space MD        Secondary Phy.:    L&D Nursing- L/D                                                              [id number]  Referred By:      Nigel Bridgeman           Location:          [facility]                    CNM ---------------------------------------------------------------------- Orders   #  Description                          Code         Ordered By   1  [place] MFM OB LIMITED                    19147.82     Myna Hidalgo  ----------------------------------------------------------------------   #  Order #                    Accession #                 Episode #   1  [id number]                  [id number]                  [id number]  ---------------------------------------------------------------------- Indications   Abdominal pain in pregnancy                    O99.89   Twin pregnancy, di/di, second trimester        O30.042   Pre-existing diabetes, type 2, in pregnancy,   O24.112   second trimester (Insulin)   Obesity complicating pregnancy, second         O99.212   trimester (BMI 43)   Abnormal biochemical screen (Panorama-         O28.9   Insufficient fetal DNA FF 2.0%, 1.2%)   [redacted] weeks gestation of pregnancy                Z3A.31  ---------------------------------------------------------------------- Fetal Evaluation (Fetus A)  Num Of Fetuses:          2  Fetal Heart Rate(bpm):   158  Cardiac Activity:        Observed  Fetal Lie:               Maternal right side  Presentation:            Breech  Placenta:                Posterior  Amniotic Fluid  AFI FV:      Within  normal limits                              Largest Pocket(cm)                              6.14  Comment:    No placental abruption or previa identified. ---------------------------------------------------------------------- OB History  Gravidity:    7         Term:   3        Prem:   0        SAB:   3  TOP:          0       Ectopic:  0        Living: 3 ---------------------------------------------------------------------- Gestational Age (Fetus A)  Best:          31w 1d     Det. By:  Marcella Dubs         EDD:   03/28/18 ---------------------------------------------------------------------- Fetal Evaluation (Fetus B)  Num Of Fetuses:          2  Fetal Heart Rate(bpm):   157  Cardiac Activity:        Observed  Fetal Lie:               Maternal left side  Presentation:            Breech  Placenta:                Posterior  Membrane Desc:      Dividing Membrane seen  Amniotic Fluid  AFI FV:      Within normal limits                              Largest Pocket(cm)                              5.67  Comment:    No placental abruption or previa identified. ---------------------------------------------------------------------- Gestational Age (Fetus B)  Best:          31w 1d     Det. By:  Marcella Dubs         EDD:   03/28/18 ---------------------------------------------------------------------- Cervix Uterus Adnexa  Cervix  Length:            4.5  cm.  Normal appearance by transabdominal scan.  Comment  Bladder wall is thick, echogenic inner layer  present (cystitis?) ---------------------------------------------------------------------- Impression  Dichorionic-diamniotic twin pregnancy.  Patient is being evaluated for preterm labor.  A limited ultrasound study was performed.  Twin A: Maternal right, breech, posterior placenta.Amniotic  fluid is normal and good fetal activity is seen.  Twin B: Maternal left, breech, posterior placenta. Amniotic  fluid is normal and good fetal activity is seen.  ----------------------------------------------------------------------                  Noralee Space, MD Electronically Signed Final Report   01/25/2018 02:42 pm ----------------------------------------------------------------------  [place] Mfm Fetal Bpp Wo Nst Addl Gestation  Result Date: 01/30/2018 ----------------------------------------------------------------------  OBSTETRICS REPORT                        (Signed Final 01/30/2018 10:52 am) ---------------------------------------------------------------------- Patient Info  ID #:       [id number]                          D.O.B.:  Nov 02, 1985 (32 yrs)  Name:       Ellen Martin                  Visit Date: 01/30/2018 07:08 am ---------------------------------------------------------------------- Performed By  Performed By:     Tomma Lightning             Ref. Address:      [facility]                    RDMS,RVT                                                              Obstetrics &                                                              Gynecology  [address]                                                              [address]                                                              [place], [place]                                                              [address]  Attending:        Lin Landsman      Secondary Phy.:    3rd Nursing- HR                    MD                                                              OB                                                              3rd Floor  Referred By:      Nigel Bridgeman           Location:          [facility]                    CNM ---------------------------------------------------------------------- Orders   #  Description                          Code         Ordered By   1  [place] MFM FETAL BPP WO NON              76819.01     ANGELA ROBERTS      STRESS   2  [place]  MFM FETAL BPP WO NST              60454.0      ANGELA ROBERTS      ADDL GESTATION  ----------------------------------------------------------------------   #  Order #  Accession #                 Episode #   1  [id number]                  [id number]                  [id number]   2  [id number]                  [id number]                  [id number]  ---------------------------------------------------------------------- Indications   [redacted] weeks gestation of pregnancy                Z3A.31  ---------------------------------------------------------------------- Vital Signs  Weight (lb): 283                               Height:        5'9"  BMI:         41.79 ---------------------------------------------------------------------- Fetal Evaluation (Fetus A)  Num Of Fetuses:          2  Fetal Heart Rate(bpm):   148  Cardiac Activity:        Observed  Fetal Lie:               Maternal right side  Presentation:            Transverse, head to maternal left  Placenta:                Posterior  Membrane Desc:      Dividing Membrane seen  Amniotic Fluid  AFI FV:      Within normal limits                              Largest Pocket(cm)                              4.4 ---------------------------------------------------------------------- Biophysical Evaluation (Fetus A)  Amniotic F.V:   Within normal limits       F. Tone:         Observed  F. Movement:    Observed                   Score:           8/8  F. Breathing:   Observed ---------------------------------------------------------------------- OB History  Gravidity:    7         Term:   3        Prem:   0        SAB:   3  TOP:          0       Ectopic:  0        Living: 3 ---------------------------------------------------------------------- Gestational Age (Fetus A)  Best:          31w 6d     Det. By:  Marcella Dubs         EDD:   03/28/18 ---------------------------------------------------------------------- Fetal Evaluation (Fetus B)  Num Of Fetuses:           2  Fetal Heart Rate(bpm):   124  Cardiac Activity:        Observed  Fetal Lie:  Maternal left side  Presentation:            Breech  Placenta:                Posterior  Amniotic Fluid  AFI FV:      Within normal limits                              Largest Pocket(cm)                              4.31 ---------------------------------------------------------------------- Biophysical Evaluation (Fetus B)  Amniotic F.V:   Within normal limits       F. Tone:         Observed  F. Movement:    Observed                   Score:           6/8  F. Breathing:   Not Observed ---------------------------------------------------------------------- Gestational Age (Fetus B)  Best:          31w 6d     Det. By:  Marcella Dubs         EDD:   03/28/18 ---------------------------------------------------------------------- Cervix Uterus Adnexa  Cervix  Not visualized (advanced GA >24wks) ---------------------------------------------------------------------- Impression  Biophysical profile 8/8 Twin A  Twin B 6/8 ---------------------------------------------------------------------- Recommendations  Follow up as clinically indicated.  Follow up NST on twin B or repeat BPP in 24 hours ----------------------------------------------------------------------               Lin Landsman, MD Electronically Signed Final Report   01/30/2018 10:52 am ----------------------------------------------------------------------  [place] Mfm Fetal Bpp Wo Nst Addl Gestation  Result Date: 01/29/2018 ----------------------------------------------------------------------  OBSTETRICS REPORT                       (Signed Final 01/29/2018 02:38 pm) ---------------------------------------------------------------------- Patient Info  ID #:       [id number]                          D.O.B.:  February 16, 1986 (32 yrs)  Name:       Ellen Martin                  Visit Date: 01/29/2018 01:28 pm  ---------------------------------------------------------------------- Performed By  Performed By:     Eden Lathe BS      Ref. Address:     [facility]                    RDMS RVT                                                             Obstetrics &                                                             Gynecology  [address]                                                             [address]                                                             [place], [place]                                                             [address]  Attending:        Noralee Space MD        Secondary Phy.:   3rd Nursing- HR                                                             OB                                                             3rd Floor  Referred By:      Nigel Bridgeman           Location:         [facility]                    CNM ---------------------------------------------------------------------- Orders   #  Description                          Code         Ordered By   1  [place] MFM FETAL BPP WO NON              76819.01     ANGELA ROBERTS      STRESS   2  [place] MFM FETAL BPP WO NST              91478.2      ANGELA ROBERTS      ADDL GESTATION  ----------------------------------------------------------------------   #  Order #                    Accession #  Episode #   1  [id number]                  [id number]   2  [id number]                  [id number]  ---------------------------------------------------------------------- Indications   [redacted] weeks gestation of pregnancy                Z3A.31   Non-reactive NST                               O28.9   Twin pregnancy, di/di, third trimester         O30.043   Pre-existing diabetes, type 2, in pregnancy,   O24.113   third trimester   Obesity complicating  pregnancy, third          O99.213   trimester   Abnormal biochemical screen (Panorama-         O28.9   Insufficient fetal DNA FF 2.0%, 1.2%)  ---------------------------------------------------------------------- Vital Signs                                                 Height:        5'9" ---------------------------------------------------------------------- Fetal Evaluation (Fetus A)  Num Of Fetuses:         2  Fetal Heart Rate(bpm):  136  Cardiac Activity:       Observed  Fetal Lie:              Maternal right side  Presentation:           Breech  Placenta:               Posterior  Amniotic Fluid  AFI FV:      Within normal limits                              Largest Pocket(cm)                              8 ---------------------------------------------------------------------- Biophysical Evaluation (Fetus A)  Amniotic F.V:   Within normal limits       F. Tone:        Observed  F. Movement:    Observed                   Score:          8/8  F. Breathing:   Observed ---------------------------------------------------------------------- OB History  Gravidity:    7         Term:   3        Prem:   0        SAB:   3  TOP:          0       Ectopic:  0        Living: 3 ---------------------------------------------------------------------- Gestational Age (Fetus A)  Best:          31w 5d  Det. By:  Marcella Dubs         EDD:   03/28/18 ---------------------------------------------------------------------- Fetal Evaluation (Fetus B)  Num Of Fetuses:         2  Fetal Heart Rate(bpm):  148  Cardiac Activity:       Observed  Fetal Lie:              Maternal left side  Presentation:           Cephalic  Placenta:               Posterior                              Largest Pocket(cm)                              5.43 ---------------------------------------------------------------------- Biophysical Evaluation (Fetus B)  Amniotic F.V:   Within normal limits       F. Tone:        Observed  F. Movement:    Observed                    Score:          6/8  F. Breathing:   Not Observed ---------------------------------------------------------------------- Gestational Age (Fetus B)  Best:          31w 5d     Det. ByMarcella Dubs         EDD:   03/28/18 ---------------------------------------------------------------------- Impression  Ms. Soulier is here for BPP because of nonreactive NST.  Blood glucose levels are within normal limits. No evidence of  hypertension.  Twin A: Maternal right, breech, posterior placenta. Amniotic  fluid is normal and good fetal activity is seen. BPP 8/8.  Twin B: Maternal left, cephalic, posterior placenta. Amniotic  fluid is normal and good fetal activity is seen. Fetal breathing  movements did not meet the criteria. BPP 6/8. ---------------------------------------------------------------------- Recommendations  -If repeat NST today is reactive, you may consider discharge  with close follow-up at your office.  -If nonreactive (especially twin B), repeat BPP tomorrow. ----------------------------------------------------------------------                  Noralee Space, MD Electronically Signed Final Report   01/29/2018 02:38 pm ----------------------------------------------------------------------  US Abdomen Limited Ruq  Result Date: 01/25/2018 CLINICAL DATA:  Right upper quadrant abdominal pain. EXAM: ULTRASOUND ABDOMEN LIMITED RIGHT UPPER QUADRANT COMPARISON:  None. FINDINGS: Gallbladder: Large stone within the gallbladder lumen. Sludge within the gallbladder lumen. No wall thickening or pericholecystic fluid. Mild pain with scanning within the right upper quadrant. Common bile duct: Diameter: 4 mm Liver: No focal lesion identified. Within normal limits in parenchymal echogenicity. Portal vein is patent on color Doppler imaging with normal direction of blood flow towards the liver. IMPRESSION: Cholelithiasis and sludge without secondary signs to suggest acute cholecystitis. Electronically Signed    By: Annia Belt M.D.   On: 01/25/2018 10:48    Activity:           unrestricted Advance as tolerated. Pelvic rest for 6 weeks.  Diet:                routine and Consistent carb diabetic diet  Medications: PNV Condition:  Pt discharge to homestable condition DM2: Continue to use insulin at home, 20units NPH in the morning and at night, 8units of novolog with  meals, and sliding scale, make sure you keep track of your BS and bring [place] your log. You need to f/u in one [facility] for BPP and every week you should get one. . Gallstones: F/U with GI PRN for now, may need surer after having the babies. Report bleeding fever and severe pain.  UTI: take another 4 days of your antibiotic.  GHTN: monitor BP at home if elevated, vision changes with HA, and RUQ pain please notifiy [place].   Meds: Allergies as of 01/30/2018   No Known Allergies     Medication List    STOP taking these medications   amoxicillin 500 MG capsule Commonly known as:  AMOXIL     TAKE these medications   accu-chek multiclix lancets Use as directed   cephALEXin 500 MG capsule Commonly known as:  KEFLEX Take 1 capsule (500 mg total) by mouth every 12 (twelve) hours for 4 days.   insulin aspart 100 UNIT/ML injection Commonly known as:  novoLOG Inject 0-14 Units into the skin 3 (three) times daily after meals. Question Answer Comment CBG < 60: call physician  CBG 70 - 90 (dose in units): 0  CBG 91 - 120 (dose in units): 1  CBG 121 - 160 (dose in units): 2  CBG 161 - 200 (dose in units): 3  CBG 201 - 240 (dose in units): 4  CBG 241 - 280 (dose in units): 6  CBG 281 - 320 (dose in units): 8  CBG 321 - 360 (dose in units): 10  CBG 361 - 400 (dose in units): 12  CBG > 400 14 units and call physician   insulin aspart 100 UNIT/ML injection Commonly known as:  novoLOG Inject 8 Units into the skin 3 (three) times daily with meals.   insulin NPH Human 100 UNIT/ML injection Commonly known as:  HUMULIN N,NOVOLIN N Inject into  the skin. What changed:  Another medication with the same name was added. Make sure you understand how and when to take each.   insulin NPH Human 100 UNIT/ML injection Commonly known as:  HUMULIN N,NOVOLIN N Inject 0.2 mLs (20 Units total) into the skin 2 (two) times daily at 8 am and 10 pm. What changed:  You were already taking a medication with the same name, and this prescription was added. Make sure you understand how and when to take each.   insulin regular 100 units/mL injection Commonly known as:  NOVOLIN R,HUMULIN R Inject 10 Units into the skin daily.   metoCLOPramide 10 MG tablet Commonly known as:  REGLAN Take 1 tablet (10 mg total) by mouth every 6 (six) hours as needed for nausea (nausea/headache).       Discharge Follow Up:  Follow-up Information    [facility] Ob[facility]etric[facility]necology Follow up.   Specialty:  Obstetrics and Gynecology Why:  Please call the office and make an appointment to be seen for week BPP for the twins as well as for BP check. Please make the appointment with a provider.  Contact information: [address] [address] [place] [address] [phone]           [place], NP-C, CNM 01/30/2018, 1:48 PM  Dale Gilby, FNP

## 2018-01-30 NOTE — Discharge Instructions (Signed)

## 2018-03-03 ENCOUNTER — Other Ambulatory Visit: Payer: Self-pay | Admitting: Obstetrics & Gynecology

## 2018-03-03 DIAGNOSIS — Z3480 Encounter for supervision of other normal pregnancy, unspecified trimester: Secondary | ICD-10-CM | POA: Insufficient documentation

## 2018-03-03 DIAGNOSIS — O30003 Twin pregnancy, unspecified number of placenta and unspecified number of amniotic sacs, third trimester: Secondary | ICD-10-CM

## 2018-03-03 DIAGNOSIS — O24419 Gestational diabetes mellitus in pregnancy, unspecified control: Secondary | ICD-10-CM

## 2018-03-03 NOTE — Progress Notes (Signed)
Patient was discharged from [facility] due to non compliance with her appointments.  She is high risk with twins/ breech/ GDM/ possible gall bladder issue.  [facility].  Patient is currently 36.[redacted] wks pregnant.

## 2018-03-04 ENCOUNTER — Other Ambulatory Visit (HOSPITAL_COMMUNITY)
Admission: RE | Admit: 2018-03-04 | Discharge: 2018-03-04 | Disposition: A | Discharge status: Home / Self Care | Payer: Medicaid Other | Source: Ambulatory Visit | Attending: Obstetrics and Gynecology | Admitting: Obstetrics and Gynecology

## 2018-03-04 ENCOUNTER — Ambulatory Visit (INDEPENDENT_AMBULATORY_CARE_PROVIDER_SITE_OTHER): Payer: Medicaid Other | Admitting: Obstetrics and Gynecology

## 2018-03-04 ENCOUNTER — Encounter: Payer: Self-pay | Admitting: Obstetrics and Gynecology

## 2018-03-04 ENCOUNTER — Other Ambulatory Visit: Payer: Self-pay

## 2018-03-04 VITALS — BP 134/92 | HR 99 | Wt 288.5 lb

## 2018-03-04 DIAGNOSIS — O30043 Twin pregnancy, dichorionic/diamniotic, third trimester: Secondary | ICD-10-CM | POA: Diagnosis not present

## 2018-03-04 DIAGNOSIS — Z23 Encounter for immunization: Secondary | ICD-10-CM

## 2018-03-04 DIAGNOSIS — O24119 Pre-existing diabetes mellitus, type 2, in pregnancy, unspecified trimester: Secondary | ICD-10-CM | POA: Diagnosis not present

## 2018-03-04 DIAGNOSIS — Z3480 Encounter for supervision of other normal pregnancy, unspecified trimester: Secondary | ICD-10-CM

## 2018-03-04 DIAGNOSIS — K8020 Calculus of gallbladder without cholecystitis without obstruction: Secondary | ICD-10-CM | POA: Diagnosis not present

## 2018-03-04 DIAGNOSIS — O139 Gestational [pregnancy-induced] hypertension without significant proteinuria, unspecified trimester: Secondary | ICD-10-CM

## 2018-03-04 NOTE — Progress Notes (Signed)
   PRENATAL VISIT NOTE  Subjective:  Ellen KaufmannJennifer Martin is a 32 y.o. [phone] at 5944w4d being seen today for ongoing prenatal care. Patient was dismissed from [facility] due to poor compliance with her appointments. This is her first appointment since hospital discharge on 01/30/2018. She is currently monitored for the following issues for this high-risk pregnancy and has Dichorionic diamniotic twin pregnancy in third trimester; Gestational hypertension; UTI (urinary tract infection); Pre-existing type 2 diabetes affecting pregnancy, antepartum; Gallstone; and Supervision of other normal pregnancy, antepartum on their problem list.  Patient reports no complaints.  Contractions: Not present. Vag. Bleeding: None.  Movement: Present. Denies leaking of fluid.   The following portions of the patient's history were reviewed and updated as appropriate: allergies, current medications, past family history, past medical history, past social history, past surgical history and problem list. Problem list updated.  Objective:   Vitals:   03/04/18 0848  BP: (!) 134/92  Pulse: 99  Weight: 288 lb 8 oz (130.9 kg)    Fetal Status: Fetal Heart Rate (bpm): 152/149   Movement: Present     General:  Alert, oriented and cooperative. Patient is in no acute distress.  Skin: Skin is warm and dry. No rash noted.   Cardiovascular: Normal heart rate noted  Respiratory: Normal respiratory effort, no problems with respiration noted  Abdomen: Soft, gravid, appropriate for gestational age.  Pain/Pressure: Present     Pelvic: Cervical exam performed Dilation: 1 Effacement (%): 50 Station: Ballotable  Extremities: Normal range of motion.  Edema: None  Mental Status: Normal mood and affect. Normal behavior. Normal judgment and thought content.   Assessment and Plan:  Pregnancy: [id number] at 10344w4d  1. Supervision of other normal pregnancy, antepartum Patient is doing well without complaints Cultures collected today Patient plans  Depo-provera for contraception Fetal malpresentation noted on 11/28 ultrasound. Discussed delivery via c-section - Cervicovaginal ancillary only( [place]) - Strep Gp B NAA  2. Pre-existing type 2 diabetes affecting pregnancy, antepartum Patient reports taking NPH 20 BID and 8 units Regular with meal coverage. She has not been checking her CBGs because she was not able to obtain a meter She plans to use her mother's meter until she is able to obtain her own BPP scheduled for this week - US MFM FETAL BPP WO NON STRESS; Future - US MFM FETAL BPP WO NST ADDL GESTATION; Future  3. Gestational hypertension, antepartum Patient with elevated BP again today (elevated during her November admission) Discussed delivery at 37 weeks Patient will be scheduled for c-section on 1/3. If fetal malpresentation not present, patient should be induced at 37 weeks - US MFM FETAL BPP WO NON STRESS; Future - US MFM FETAL BPP WO NST ADDL GESTATION; Future  4. Dichorionic diamniotic twin pregnancy in third trimester - US MFM FETAL BPP WO NON STRESS; Future - US MFM FETAL BPP WO NST ADDL GESTATION; Future  5. Calculus of gallbladder without cholecystitis without obstruction Patient is without symptoms since discharge  Preterm labor symptoms and general obstetric precautions including but not limited to vaginal bleeding, contractions, leaking of fluid and fetal movement were reviewed in detail with the patient. Please refer to After Visit Summary for other counseling recommendations.  Return in about 6 weeks (around 04/15/2018) for postpartum.  Future Appointments  Date Time Provider Department Center  03/06/2018  9:00 AM [facility] US 3 [facility]    Catalina AntiguaPeggy Jonthan Leite, MD

## 2018-03-04 NOTE — Progress Notes (Signed)
New OB, dismissed from [facility].  FLU & TDAP given without difficulty.

## 2018-03-06 ENCOUNTER — Encounter (HOSPITAL_COMMUNITY)
Admission: RE | Admit: 2018-03-06 | Discharge: 2018-03-06 | Disposition: A | Discharge status: Home / Self Care | Payer: Medicaid Other | Source: Ambulatory Visit

## 2018-03-06 ENCOUNTER — Ambulatory Visit (HOSPITAL_COMMUNITY): Admission: RE | Admit: 2018-03-06 | Payer: Medicaid Other | Source: Ambulatory Visit

## 2018-03-06 ENCOUNTER — Encounter (HOSPITAL_COMMUNITY): Payer: Self-pay

## 2018-03-06 HISTORY — DX: Encounter for other specified aftercare: Z51.89

## 2018-03-06 LAB — STREP GP B NAA: STREP GROUP B AG: POSITIVE — AB

## 2018-03-06 NOTE — Patient Instructions (Signed)
Ellen Martin  03/06/2018   Your procedure is scheduled on:  03/07/2018  Enter through the Main Entrance of [facility] at 1015 AM.  Tell them you are here for your Cesarean but you need to see admitting first.  Call this number if you have problems the morning of [phone]  Remember:   Do not eat food:(After Midnight) Desps de medianoche.  Do not drink clear liquids: (After Midnight) Desps de medianoche.  Take these medicines the morning of surgery with A SIP OF WATER: take 10 units of insulin night before surgery.  DO NOT TAKE ANY MEDICATION THE DAY OF SURGERY   Do not wear jewelry, make-up or nail polish.  Do not wear lotions, powders, or perfumes. Do not wear deodorant.  Do not shave 48 hours prior to surgery.  Do not bring valuables to the hospital.  [facility] is not   responsible for any belongings or valuables brought to the hospital.  Contacts, dentures or bridgework may not be worn into surgery.  Leave suitcase in the car. After surgery it may be brought to your room.  For patients admitted to the hospital, checkout time is 11:00 AM the day of              discharge.    N/A   Please read over the following fact sheets that you were given:   Surgical Site Infection Prevention

## 2018-03-07 ENCOUNTER — Inpatient Hospital Stay (HOSPITAL_COMMUNITY): Payer: Medicaid Other

## 2018-03-07 ENCOUNTER — Inpatient Hospital Stay (HOSPITAL_COMMUNITY)
Admission: RE | Admit: 2018-03-07 | Discharge: 2018-03-09 | DRG: 786 | Disposition: A | Discharge status: Home / Self Care | Payer: Medicaid Other | Attending: Obstetrics and Gynecology | Admitting: Obstetrics and Gynecology

## 2018-03-07 ENCOUNTER — Encounter (HOSPITAL_COMMUNITY): Payer: Self-pay | Admitting: *Deleted

## 2018-03-07 ENCOUNTER — Inpatient Hospital Stay (HOSPITAL_BASED_OUTPATIENT_CLINIC_OR_DEPARTMENT_OTHER): Payer: Medicaid Other

## 2018-03-07 ENCOUNTER — Other Ambulatory Visit: Payer: Self-pay

## 2018-03-07 ENCOUNTER — Encounter (HOSPITAL_COMMUNITY)
Admission: RE | Disposition: A | Discharge status: Home / Self Care | Payer: Self-pay | Source: Home / Self Care | Attending: Obstetrics and Gynecology

## 2018-03-07 DIAGNOSIS — Z3A37 37 weeks gestation of pregnancy: Secondary | ICD-10-CM

## 2018-03-07 DIAGNOSIS — E119 Type 2 diabetes mellitus without complications: Secondary | ICD-10-CM | POA: Diagnosis present

## 2018-03-07 DIAGNOSIS — O99334 Smoking (tobacco) complicating childbirth: Secondary | ICD-10-CM | POA: Diagnosis present

## 2018-03-07 DIAGNOSIS — O365932 Maternal care for other known or suspected poor fetal growth, third trimester, fetus 2: Secondary | ICD-10-CM | POA: Diagnosis present

## 2018-03-07 DIAGNOSIS — O99213 Obesity complicating pregnancy, third trimester: Secondary | ICD-10-CM

## 2018-03-07 DIAGNOSIS — F17210 Nicotine dependence, cigarettes, uncomplicated: Secondary | ICD-10-CM | POA: Diagnosis not present

## 2018-03-07 DIAGNOSIS — O2412 Pre-existing diabetes mellitus, type 2, in childbirth: Secondary | ICD-10-CM | POA: Diagnosis not present

## 2018-03-07 DIAGNOSIS — O134 Gestational [pregnancy-induced] hypertension without significant proteinuria, complicating childbirth: Secondary | ICD-10-CM | POA: Diagnosis not present

## 2018-03-07 DIAGNOSIS — O30043 Twin pregnancy, dichorionic/diamniotic, third trimester: Secondary | ICD-10-CM

## 2018-03-07 DIAGNOSIS — O24113 Pre-existing diabetes mellitus, type 2, in pregnancy, third trimester: Secondary | ICD-10-CM | POA: Diagnosis not present

## 2018-03-07 DIAGNOSIS — Z794 Long term (current) use of insulin: Secondary | ICD-10-CM

## 2018-03-07 DIAGNOSIS — O321XX0 Maternal care for breech presentation, not applicable or unspecified: Secondary | ICD-10-CM | POA: Diagnosis not present

## 2018-03-07 DIAGNOSIS — O139 Gestational [pregnancy-induced] hypertension without significant proteinuria, unspecified trimester: Secondary | ICD-10-CM | POA: Diagnosis present

## 2018-03-07 DIAGNOSIS — O289 Unspecified abnormal findings on antenatal screening of mother: Secondary | ICD-10-CM

## 2018-03-07 DIAGNOSIS — O30049 Twin pregnancy, dichorionic/diamniotic, unspecified trimester: Secondary | ICD-10-CM | POA: Diagnosis present

## 2018-03-07 DIAGNOSIS — Z98891 History of uterine scar from previous surgery: Secondary | ICD-10-CM

## 2018-03-07 DIAGNOSIS — O321XX1 Maternal care for breech presentation, fetus 1: Secondary | ICD-10-CM | POA: Diagnosis not present

## 2018-03-07 DIAGNOSIS — O99824 Streptococcus B carrier state complicating childbirth: Secondary | ICD-10-CM | POA: Diagnosis not present

## 2018-03-07 DIAGNOSIS — O24119 Pre-existing diabetes mellitus, type 2, in pregnancy, unspecified trimester: Secondary | ICD-10-CM | POA: Diagnosis present

## 2018-03-07 DIAGNOSIS — Z3480 Encounter for supervision of other normal pregnancy, unspecified trimester: Secondary | ICD-10-CM

## 2018-03-07 LAB — CBC
HCT: 42.9 % (ref 36.0–46.0)
Hemoglobin: 15 g/dL (ref 12.0–15.0)
MCH: 31.6 pg (ref 26.0–34.0)
MCHC: 35 g/dL (ref 30.0–36.0)
MCV: 90.5 fL (ref 80.0–100.0)
Platelets: 167 10*3/uL (ref 150–400)
RBC: 4.74 MIL/uL (ref 3.87–5.11)
RDW: 13.5 % (ref 11.5–15.5)
WBC: 9.4 10*3/uL (ref 4.0–10.5)
nRBC: 0.2 % (ref 0.0–0.2)

## 2018-03-07 LAB — PREPARE RBC (CROSSMATCH)

## 2018-03-07 LAB — CERVICOVAGINAL ANCILLARY ONLY
Chlamydia: NEGATIVE
Neisseria Gonorrhea: NEGATIVE

## 2018-03-07 LAB — GLUCOSE, CAPILLARY: Glucose-Capillary: 125 mg/dL — ABNORMAL HIGH (ref 70–99)

## 2018-03-07 SURGERY — Surgical Case
Anesthesia: Spinal

## 2018-03-07 MED ORDER — DIPHENHYDRAMINE HCL 25 MG PO CAPS
25.0000 mg | ORAL_CAPSULE | ORAL | Status: DC | PRN
  Administered 2018-03-08 (×2): 25 mg via ORAL
  Filled 2018-03-07 (×2): qty 1

## 2018-03-07 MED ORDER — EPHEDRINE 5 MG/ML INJ
INTRAVENOUS | Status: AC
  Filled 2018-03-07: qty 10

## 2018-03-07 MED ORDER — BUPIVACAINE HCL (PF) 0.5 % IJ SOLN
INTRAMUSCULAR | Status: AC
  Filled 2018-03-07: qty 30

## 2018-03-07 MED ORDER — SENNOSIDES-DOCUSATE SODIUM 8.6-50 MG PO TABS
2.0000 | ORAL_TABLET | ORAL | Status: DC
  Administered 2018-03-07 – 2018-03-08 (×2): 2 via ORAL
  Filled 2018-03-07 (×2): qty 2

## 2018-03-07 MED ORDER — TRANEXAMIC ACID 1000 MG/10ML IV SOLN: INTRAVENOUS | Status: DC | PRN

## 2018-03-07 MED ORDER — OXYTOCIN 10 UNIT/ML IJ SOLN
INTRAMUSCULAR | Status: AC
  Filled 2018-03-07: qty 4

## 2018-03-07 MED ORDER — FENTANYL CITRATE (PF) 100 MCG/2ML IJ SOLN
INTRAMUSCULAR | Status: AC
  Filled 2018-03-07: qty 2

## 2018-03-07 MED ORDER — SOD CITRATE-CITRIC ACID 500-334 MG/5ML PO SOLN
ORAL | Status: AC
  Administered 2018-03-07: 30 mL
  Filled 2018-03-07: qty 15

## 2018-03-07 MED ORDER — NALOXONE HCL 0.4 MG/ML IJ SOLN: 0.4000 mg | INTRAMUSCULAR | Status: DC | PRN

## 2018-03-07 MED ORDER — DEXTROSE 5 % IV SOLN
3.0000 g | INTRAVENOUS | Status: DC
  Filled 2018-03-07: qty 3000

## 2018-03-07 MED ORDER — INSULIN NPH (HUMAN) (ISOPHANE) 100 UNIT/ML ~~LOC~~ SUSP
10.0000 [IU] | Freq: Two times a day (BID) | SUBCUTANEOUS | Status: DC
  Administered 2018-03-08 (×2): 10 [IU] via SUBCUTANEOUS
  Filled 2018-03-07: qty 10

## 2018-03-07 MED ORDER — SOD CITRATE-CITRIC ACID 500-334 MG/5ML PO SOLN: 30.0000 mL | ORAL | Status: DC

## 2018-03-07 MED ORDER — OXYCODONE HCL 5 MG PO TABS: 5.0000 mg | ORAL_TABLET | Freq: Once | ORAL | Status: DC | PRN

## 2018-03-07 MED ORDER — OXYCODONE HCL 5 MG/5ML PO SOLN: 5.0000 mg | Freq: Once | ORAL | Status: DC | PRN

## 2018-03-07 MED ORDER — PRENATAL MULTIVITAMIN CH
1.0000 | ORAL_TABLET | Freq: Every day | ORAL | Status: DC
  Administered 2018-03-08: 1 via ORAL
  Filled 2018-03-07: qty 1

## 2018-03-07 MED ORDER — SIMETHICONE 80 MG PO CHEW
80.0000 mg | CHEWABLE_TABLET | Freq: Three times a day (TID) | ORAL | Status: DC
  Administered 2018-03-08 – 2018-03-09 (×3): 80 mg via ORAL
  Filled 2018-03-07 (×4): qty 1

## 2018-03-07 MED ORDER — MORPHINE SULFATE (PF) 0.5 MG/ML IJ SOLN
INTRAMUSCULAR | Status: DC | PRN
  Administered 2018-03-07: .15 mg via INTRATHECAL

## 2018-03-07 MED ORDER — MEPERIDINE HCL 25 MG/ML IJ SOLN: 6.2500 mg | INTRAMUSCULAR | Status: DC | PRN

## 2018-03-07 MED ORDER — MORPHINE SULFATE (PF) 0.5 MG/ML IJ SOLN
INTRAMUSCULAR | Status: AC
  Filled 2018-03-07: qty 10

## 2018-03-07 MED ORDER — EPHEDRINE SULFATE 50 MG/ML IJ SOLN
INTRAMUSCULAR | Status: DC | PRN
  Administered 2018-03-07: 10 mg via INTRAVENOUS
  Administered 2018-03-07 (×2): 5 mg via INTRAVENOUS

## 2018-03-07 MED ORDER — GLYCOPYRROLATE 0.2 MG/ML IJ SOLN
INTRAMUSCULAR | Status: AC
  Filled 2018-03-07: qty 1

## 2018-03-07 MED ORDER — NALOXONE HCL 4 MG/10ML IJ SOLN: 1.0000 ug/kg/h | INTRAVENOUS | Status: DC | PRN

## 2018-03-07 MED ORDER — SODIUM CHLORIDE 0.9% FLUSH: 3.0000 mL | INTRAVENOUS | Status: DC | PRN

## 2018-03-07 MED ORDER — FENTANYL CITRATE (PF) 100 MCG/2ML IJ SOLN
INTRAMUSCULAR | Status: DC | PRN
  Administered 2018-03-07: 15 ug via INTRATHECAL

## 2018-03-07 MED ORDER — TRANEXAMIC ACID-NACL 1000-0.7 MG/100ML-% IV SOLN
1000.0000 mg | INTRAVENOUS | Status: AC
  Administered 2018-03-07: 1000 mg via INTRAVENOUS
  Filled 2018-03-07: qty 100

## 2018-03-07 MED ORDER — NALBUPHINE HCL 10 MG/ML IJ SOLN: 5.0000 mg | Freq: Once | INTRAMUSCULAR | Status: DC | PRN

## 2018-03-07 MED ORDER — OXYCODONE HCL 5 MG PO TABS: 5.0000 mg | ORAL_TABLET | ORAL | Status: DC | PRN

## 2018-03-07 MED ORDER — PHENYLEPHRINE 8 MG IN D5W 100 ML (0.08MG/ML) PREMIX OPTIME
INJECTION | INTRAVENOUS | Status: DC | PRN
  Administered 2018-03-07: 60 ug/min via INTRAVENOUS

## 2018-03-07 MED ORDER — MENTHOL 3 MG MT LOZG: 1.0000 | LOZENGE | OROMUCOSAL | Status: DC | PRN

## 2018-03-07 MED ORDER — HYDROMORPHONE HCL 1 MG/ML IJ SOLN
0.2500 mg | INTRAMUSCULAR | Status: DC | PRN
  Administered 2018-03-07: 0.5 mg via INTRAVENOUS

## 2018-03-07 MED ORDER — IBUPROFEN 800 MG PO TABS
800.0000 mg | ORAL_TABLET | Freq: Three times a day (TID) | ORAL | Status: DC
  Administered 2018-03-07 – 2018-03-09 (×4): 800 mg via ORAL
  Filled 2018-03-07 (×5): qty 1

## 2018-03-07 MED ORDER — OXYTOCIN 40 UNITS IN LACTATED RINGERS INFUSION - SIMPLE MED: 2.5000 [IU]/h | INTRAVENOUS | Status: AC

## 2018-03-07 MED ORDER — DIBUCAINE 1 % RE OINT: 1.0000 "application " | TOPICAL_OINTMENT | RECTAL | Status: DC | PRN

## 2018-03-07 MED ORDER — SODIUM CHLORIDE 0.9% IV SOLUTION: Freq: Once | INTRAVENOUS | Status: DC

## 2018-03-07 MED ORDER — DIPHENHYDRAMINE HCL 50 MG/ML IJ SOLN
12.5000 mg | INTRAMUSCULAR | Status: DC | PRN
  Administered 2018-03-07: 12.5 mg via INTRAVENOUS

## 2018-03-07 MED ORDER — LACTATED RINGERS IV SOLN
INTRAVENOUS | Status: DC
  Administered 2018-03-07 (×4): via INTRAVENOUS

## 2018-03-07 MED ORDER — SCOPOLAMINE 1 MG/3DAYS TD PT72
1.0000 | MEDICATED_PATCH | Freq: Once | TRANSDERMAL | Status: DC
  Administered 2018-03-07: 1.5 mg via TRANSDERMAL
  Filled 2018-03-07: qty 1

## 2018-03-07 MED ORDER — WITCH HAZEL-GLYCERIN EX PADS: 1.0000 "application " | MEDICATED_PAD | CUTANEOUS | Status: DC | PRN

## 2018-03-07 MED ORDER — COCONUT OIL OIL: 1.0000 "application " | TOPICAL_OIL | Status: DC | PRN

## 2018-03-07 MED ORDER — PROMETHAZINE HCL 25 MG/ML IJ SOLN: 6.2500 mg | INTRAMUSCULAR | Status: DC | PRN

## 2018-03-07 MED ORDER — DEXAMETHASONE SODIUM PHOSPHATE 4 MG/ML IJ SOLN
INTRAMUSCULAR | Status: AC
  Filled 2018-03-07: qty 1

## 2018-03-07 MED ORDER — NALBUPHINE HCL 10 MG/ML IJ SOLN: 5.0000 mg | INTRAMUSCULAR | Status: DC | PRN

## 2018-03-07 MED ORDER — ENOXAPARIN SODIUM 60 MG/0.6ML ~~LOC~~ SOLN
60.0000 mg | SUBCUTANEOUS | Status: DC
  Administered 2018-03-08: 60 mg via SUBCUTANEOUS
  Filled 2018-03-07: qty 0.6

## 2018-03-07 MED ORDER — PHENYLEPHRINE 40 MCG/ML (10ML) SYRINGE FOR IV PUSH (FOR BLOOD PRESSURE SUPPORT)
PREFILLED_SYRINGE | INTRAVENOUS | Status: AC
  Filled 2018-03-07: qty 10

## 2018-03-07 MED ORDER — SODIUM CHLORIDE 0.9 % IR SOLN
Status: DC | PRN
  Administered 2018-03-07: 1000 mL

## 2018-03-07 MED ORDER — GLYCOPYRROLATE 0.2 MG/ML IJ SOLN
INTRAMUSCULAR | Status: DC | PRN
  Administered 2018-03-07: .1 mg via INTRAVENOUS

## 2018-03-07 MED ORDER — ONDANSETRON HCL 4 MG/2ML IJ SOLN: 4.0000 mg | Freq: Three times a day (TID) | INTRAMUSCULAR | Status: DC | PRN

## 2018-03-07 MED ORDER — BUPIVACAINE IN DEXTROSE 0.75-8.25 % IT SOLN
INTRATHECAL | Status: DC | PRN
  Administered 2018-03-07: 1.6 mL via INTRATHECAL

## 2018-03-07 MED ORDER — TETANUS-DIPHTH-ACELL PERTUSSIS 5-2.5-18.5 LF-MCG/0.5 IM SUSP: 0.5000 mL | Freq: Once | INTRAMUSCULAR | Status: DC

## 2018-03-07 MED ORDER — ONDANSETRON HCL 4 MG/2ML IJ SOLN
INTRAMUSCULAR | Status: AC
  Filled 2018-03-07: qty 2

## 2018-03-07 MED ORDER — PHENYLEPHRINE HCL 10 MG/ML IJ SOLN
INTRAMUSCULAR | Status: DC | PRN
  Administered 2018-03-07: 40 ug via INTRAVENOUS
  Administered 2018-03-07 (×3): 80 ug via INTRAVENOUS

## 2018-03-07 MED ORDER — HYDROMORPHONE HCL 1 MG/ML IJ SOLN
INTRAMUSCULAR | Status: AC
  Filled 2018-03-07: qty 1

## 2018-03-07 MED ORDER — PHENYLEPHRINE 8 MG IN D5W 100 ML (0.08MG/ML) PREMIX OPTIME
INJECTION | INTRAVENOUS | Status: AC
  Filled 2018-03-07: qty 100

## 2018-03-07 MED ORDER — LACTATED RINGERS IV SOLN
INTRAVENOUS | Status: DC
  Administered 2018-03-08: 05:00:00 via INTRAVENOUS

## 2018-03-07 MED ORDER — DIPHENHYDRAMINE HCL 50 MG/ML IJ SOLN
INTRAMUSCULAR | Status: AC
  Filled 2018-03-07: qty 1

## 2018-03-07 MED ORDER — LACTATED RINGERS IV SOLN
INTRAVENOUS | Status: DC | PRN
  Administered 2018-03-07 (×2): via INTRAVENOUS

## 2018-03-07 MED ORDER — KETOROLAC TROMETHAMINE 30 MG/ML IJ SOLN: 30.0000 mg | Freq: Once | INTRAMUSCULAR | Status: DC | PRN

## 2018-03-07 MED ORDER — SIMETHICONE 80 MG PO CHEW
80.0000 mg | CHEWABLE_TABLET | ORAL | Status: DC | PRN
  Administered 2018-03-07: 80 mg via ORAL

## 2018-03-07 MED ORDER — ONDANSETRON HCL 4 MG/2ML IJ SOLN
INTRAMUSCULAR | Status: DC | PRN
  Administered 2018-03-07: 4 mg via INTRAVENOUS

## 2018-03-07 MED ORDER — SIMETHICONE 80 MG PO CHEW
80.0000 mg | CHEWABLE_TABLET | ORAL | Status: DC
  Administered 2018-03-08 (×2): 80 mg via ORAL
  Filled 2018-03-07 (×2): qty 1

## 2018-03-07 MED ORDER — MEASLES, MUMPS & RUBELLA VAC IJ SOLR: 0.5000 mL | Freq: Once | INTRAMUSCULAR | Status: DC

## 2018-03-07 MED ORDER — OXYTOCIN 10 UNIT/ML IJ SOLN
INTRAVENOUS | Status: DC | PRN
  Administered 2018-03-07: 40 [IU] via INTRAVENOUS

## 2018-03-07 MED ORDER — DEXTROSE 5 % IV SOLN
INTRAVENOUS | Status: DC | PRN
  Administered 2018-03-07: 3 g via INTRAVENOUS

## 2018-03-07 MED ORDER — ZOLPIDEM TARTRATE 5 MG PO TABS: 5.0000 mg | ORAL_TABLET | Freq: Every evening | ORAL | Status: DC | PRN

## 2018-03-07 MED ORDER — DIPHENHYDRAMINE HCL 25 MG PO CAPS
25.0000 mg | ORAL_CAPSULE | Freq: Four times a day (QID) | ORAL | Status: DC | PRN
  Administered 2018-03-07: 25 mg via ORAL
  Filled 2018-03-07: qty 1

## 2018-03-07 SURGICAL SUPPLY — 39 items
BENZOIN TINCTURE PRP APPL 2/3 (GAUZE/BANDAGES/DRESSINGS) ×3 IMPLANT
CHLORAPREP W/TINT 26ML (MISCELLANEOUS) ×6 IMPLANT
CLAMP CORD UMBIL (MISCELLANEOUS) ×6 IMPLANT
CLOSURE WOUND 1/2 X4 (GAUZE/BANDAGES/DRESSINGS) ×1
CLOTH BEACON ORANGE TIMEOUT ST (SAFETY) ×3 IMPLANT
DRSG OPSITE POSTOP 4X10 (GAUZE/BANDAGES/DRESSINGS) ×3 IMPLANT
ELECT REM PT RETURN 9FT ADLT (ELECTROSURGICAL) ×3
ELECTRODE REM PT RTRN 9FT ADLT (ELECTROSURGICAL) ×1 IMPLANT
EXTRACTOR VACUUM BELL STYLE (SUCTIONS) ×3 IMPLANT
GLOVE BIOGEL PI IND STRL 6.5 (GLOVE) ×2 IMPLANT
GLOVE BIOGEL PI IND STRL 7.0 (GLOVE) ×2 IMPLANT
GLOVE BIOGEL PI INDICATOR 6.5 (GLOVE) ×4
GLOVE BIOGEL PI INDICATOR 7.0 (GLOVE) ×4
GLOVE ORTHOPEDIC STR SZ6.5 (GLOVE) ×3 IMPLANT
GOWN STRL REUS W/TWL LRG LVL3 (GOWN DISPOSABLE) ×9 IMPLANT
KIT ABG SYR 3ML LUER SLIP (SYRINGE) ×3 IMPLANT
KIT PREVENA INCISION MGT20CM45 (CANNISTER) ×3 IMPLANT
NEEDLE HYPO 22GX1.5 SAFETY (NEEDLE) ×3 IMPLANT
NEEDLE HYPO 25X1 1.5 SAFETY (NEEDLE) ×3 IMPLANT
NS IRRIG 1000ML POUR BTL (IV SOLUTION) ×3 IMPLANT
PACK C SECTION WH (CUSTOM PROCEDURE TRAY) ×3 IMPLANT
PAD OB MATERNITY 4.3X12.25 (PERSONAL CARE ITEMS) ×3 IMPLANT
PENCIL SMOKE EVAC W/HOLSTER (ELECTROSURGICAL) ×3 IMPLANT
RETRACTOR TRAXI PANNICULUS (MISCELLANEOUS) ×1 IMPLANT
RTRCTR C-SECT PINK 25CM LRG (MISCELLANEOUS) ×3 IMPLANT
SPONGE LAP 18X18 RF (DISPOSABLE) ×12 IMPLANT
STRIP CLOSURE SKIN 1/2X4 (GAUZE/BANDAGES/DRESSINGS) ×2 IMPLANT
SUT MON AB 4-0 PS1 27 (SUTURE) ×3 IMPLANT
SUT PDS AB 0 CTX 60 (SUTURE) ×3 IMPLANT
SUT PLAIN 0 NONE (SUTURE) ×3 IMPLANT
SUT PLAIN 2 0 (SUTURE) ×2
SUT PLAIN ABS 2-0 CT1 27XMFL (SUTURE) ×1 IMPLANT
SUT VIC AB 0 CT1 36 (SUTURE) ×6 IMPLANT
SUT VIC AB 0 CTX 36 (SUTURE) ×2
SUT VIC AB 0 CTX36XBRD ANBCTRL (SUTURE) ×1 IMPLANT
SYR CONTROL 10ML LL (SYRINGE) ×3 IMPLANT
TOWEL OR 17X24 6PK STRL BLUE (TOWEL DISPOSABLE) ×3 IMPLANT
TRAXI PANNICULUS RETRACTOR (MISCELLANEOUS) ×2
TRAY FOLEY W/BAG SLVR 14FR LF (SET/KITS/TRAYS/PACK) ×3 IMPLANT

## 2018-03-07 NOTE — Progress Notes (Signed)
1735 CBG in PACU-89

## 2018-03-07 NOTE — Transfer of Care (Addendum)
Immediate Anesthesia Transfer of Care Note  Patient: Ellen Martin  Procedure(s) Performed: CESAREAN SECTION MULTI-GESTATIONAL (N/A )  Patient Location: PACU  Anesthesia Type:Spinal  Level of Consciousness: awake and oriented  Airway & Oxygen Therapy: Patient Spontanous Breathing  Post-op Assessment: Report given to RN and Post -op Vital signs reviewed and stable  Post vital signs: Reviewed and stable  Last Vitals:  Vitals Value Taken Time  BP 116/93 03/07/2018  5:27 PM  Temp    Pulse 93 03/07/2018  5:29 PM  Resp 20 03/07/2018  5:29 PM  SpO2 100 % 03/07/2018  5:29 PM  Vitals shown include unvalidated device data.  Last Pain:  Vitals:   03/07/18 1727  TempSrc: (P) Oral  PainSc: (P) 0-No pain         Complications: No apparent anesthesia complications

## 2018-03-07 NOTE — Progress Notes (Signed)
Faculty Note   OBSTETRICS REPORT                       (Signed Final 03/07/2018 02:58 pm) ---------------------------------------------------------------------- Patient Info  ID #:       [id number]                          D.O.B.:  02-10-1986 (32 yrs)  Name:       Ellen Martin                  Visit Date: 03/07/2018 01:32 pm ---------------------------------------------------------------------- Performed By  Performed By:     Marcellina Millin          Ref. Address:      [facility]                                                              Obstetrics &                                                              Gynecology                                                              [address]                                                              [address]                                                              [place], [place]                                                              [address]  Attending:  Corenthian Booker      Secondary Phy.:    L&D Nursing- L/D                    MD                                                              [id number]  Referred By:      Nigel Bridgeman           Location:          [facility]                    CNM ---------------------------------------------------------------------- Orders   #  Description                           Code        Ordered By   1  [place] MFM OB FOLLOW UP ADDL              96045.40    Curley Fayette      GEST   2  [place] MFM OB FOLLOW UP                   98119.14    Murtaza Shell  ----------------------------------------------------------------------   #  Order #                     Accession #                Episode #   1  [id number]                   [id number]                 [id number]   2  [id number]                   [id number]                 [id number]  ---------------------------------------------------------------------- Indications   Pre-existing diabetes, type 2, in pregnancy,    O24.113   third trimester   [redacted] weeks gestation of pregnancy                 Z3A.37   Twin pregnancy, di/di, third trimester          O30.043   Obesity complicating pregnancy, third           O99.213   trimester   Abnormal biochemical screen (Panorama-          O28.9   Insufficient fetal DNA FF 2.0%, 1.2%)  ---------------------------------------------------------------------- Vital Signs                                                 Height:        5'9" ---------------------------------------------------------------------- Fetal Evaluation (Fetus A)  Num Of Fetuses:          2  Fetal Heart              142  Rate(bpm):  Cardiac Activity:  Observed  Fetal Lie:               Maternal Left Fetus  Presentation:            Breech  Placenta:                Anterior  P. Cord Insertion:       Previously Visualized  Membrane Desc:      Dividing Membrane seen - Dichorionic.  Amniotic Fluid  AFI FV:      Within normal limits                              Largest Pocket(cm)                              3.57 ---------------------------------------------------------------------- Biometry (Fetus A)  BPD:      81.7  mm     G. Age:  32w 6d          0  %    CI:         73.49  %    70 - 86                                                          FL/HC:       22.3  %    20.8 - 22.6  HC:      302.8  mm     G. Age:  33w 5d        < 3  %    HC/AC:       0.95       0.92 - 1.05  AC:        319  mm     G. Age:  35w 6d         30  %    FL/BPD:      82.5  %    71 - 87  FL:       67.4  mm     G. Age:  34w 5d          6  %    FL/AC:       21.1  %    20 - 24  Est. FW:    2557   g    5 lb 10 oz      24  %     FW Discordancy     0 \ 18  %                     m ---------------------------------------------------------------------- OB History  Gravidity:    7         Term:   3         Prem:   0         SAB:   3  TOP:          0       Ectopic:  0        Living: 3 ---------------------------------------------------------------------- Gestational Age (Fetus A)  U/S Today:     34w 2d  EDD:    04/16/18  Best:          37w 0d     Det. By:  Marcella DubsEarly Ultrasound         EDD:    03/28/18 ---------------------------------------------------------------------- Anatomy (Fetus A)  Cranium:               Appears normal         Aortic Arch:            Previously seen  Cavum:                 Previously seen        Ductal Arch:            Not well visualized  Ventricles:            Previously seen        Diaphragm:              Previously seen  Choroid Plexus:        Previously seen        Stomach:                Appears normal,                                                                        left sided  Cerebellum:            Previously seen        Abdomen:                Appears normal  Posterior Fossa:       Previously seen        Abdominal Wall:         Previously seen  Nuchal Fold:           Not applicable (>20    Cord Vessels:           Previously seen                         wks GA)  Face:                  Orbits and profile     Kidneys:                Previously seen                         previously seen  Lips:                  Previously seen        Bladder:                Appears normal  Thoracic:              Appears normal         Spine:                  Ltd views no  intracranial signs of                                                                        NT  Heart:                 Previously seen        Upper Extremities:      Previously seen  RVOT:                  Previously seen        Lower Extremities:      Previously seen  LVOT:                  Not well visualized  Other:  Fetus appears to be a female. Technically difficult due to maternal           habitus and fetal position. ---------------------------------------------------------------------- Fetal Evaluation (Fetus B)  Num Of Fetuses:          2  Fetal Heart              130  Rate(bpm):  Cardiac Activity:        Observed  Fetal Lie:               Upper Fetus  Presentation:            Transverse, head to maternal right  Placenta:                Anterior  P. Cord Insertion:       Previously Visualized  Amniotic Fluid  AFI FV:      Subjectively decreased                              Largest Pocket(cm)                              2.1 ---------------------------------------------------------------------- Biometry (Fetus B)  BPD:      88.3  mm     G. Age:  35w 5d         29  %    CI:         83.83  %    70 - 86                                                          FL/HC:       20.8  %    20.8 - 22.6  HC:        304  mm     G. Age:  33w 6d        < 3  %    HC/AC:       1.06       0.92 - 1.05  AC:      286.1  mm     G. Age:  32w 4d        < 3  %  FL/BPD:      71.5  %    71 - 87  FL:       63.1  mm     G. Age:  32w 5d        < 3  %    FL/AC:       22.1  %    20 - 24  Est. FW:    2101   g    4 lb 10 oz    < 10  %     FW Discordancy        18   %                     m ---------------------------------------------------------------------- Gestational Age (Fetus B)  U/S Today:     33w 5d                                        EDD:    04/20/18  Best:          37w 0d     Det. By:  Marcella DubsEarly Ultrasound         EDD:    03/28/18 ---------------------------------------------------------------------- Anatomy (Fetus B)  Cranium:               Appears normal         Aortic Arch:            Not well visualized  Cavum:                 Previously seen        Ductal Arch:            Not well visualized  Ventricles:            Previously seen        Diaphragm:              Previously seen  Choroid Plexus:        Not well visualized    Stomach:                Appears normal,                                                                         left sided  Cerebellum:            Previously seen        Abdomen:                Previously seen  Posterior Fossa:       Previously seen        Abdominal Wall:         Previously seen  Nuchal Fold:           Not applicable (>20    Cord Vessels:           2 vessel cord,                         wks GA)  absent right umb                                                                        art  Face:                  Profile previously     Kidneys:                See comments                         seen  Lips:                  Previously seen        Bladder:                Appears normal  Thoracic:              Appears normal         Spine:                  Ltd views no                                                                        intracranial signs of                                                                        NT  Heart:                 Not well visualized    Upper Extremities:      Previously seen  RVOT:                  Previously seen        Lower Extremities:      Previously seen  LVOT:                  Previously seen  Other:  Fetus appears to be female. Nasal bone visualized. Open Rt hand          seen. Technically difficult due to maternal habitus and fetal position. ---------------------------------------------------------------------- Cervix Uterus Adnexa  Cervix  Not visualized (advanced GA >24wks) ---------------------------------------------------------------------- Impression  Diamniotic Dichorionic pregnancy  Twin discordance at 18%  Twin B  Single umbilcal artery in Twin B  EFW <10% in Twin B  Oligohydramios in Twin B  Twin A  Breech presentation ---------------------------------------------------------------------- Recommendations  To L&D for delivery. ----------------------------------------------------------------------                Lin Landsman, MD Electronically Signed Final Report   03/07/2018 02:58 pm    Patient is 33 yo Z7H1505 @ [redacted]w[redacted]d  with di/di twins, gHTN, T2DM, who presents for delivery. Found to have breech/transverse presentation, IUGR in Twin B. Will proceed with primary c-section.  The risks of cesarean section were discussed with the patient; including but not limited to: infection which may require antibiotics; bleeding which may require transfusion or re-operation; injury to bowel, bladder, ureters or other surrounding organs; injury to the fetus; need for additional procedures including hysterectomy in the event of a life-threatening hemorrhage; placental abnormalities wth subsequent pregnancies, risk of needing c-sections in future pregnancies, incisional problems, thromboembolic phenomenon and other postoperative/anesthesia complications. Answered all questions. The patient verbalized understanding of the plan, giving informed consent for the procedure. She is agreeable to blood transfusion in the event of emergency.  Patient has been NPO since 8 pm last night, she will remain NPO for procedure Anesthesia and OR aware Preoperative prophylactic antibiotics and SCDs ordered on call to the OR  To OR when ready   K. Therese Sarah, M.D. Attending Center for [employer] [facility])

## 2018-03-07 NOTE — Anesthesia Preprocedure Evaluation (Signed)
Anesthesia Evaluation    Airway Mallampati: II  TM Distance: >3 FB Neck ROM: Full    Dental no notable dental hx.    Pulmonary Current Smoker,    Pulmonary exam normal breath sounds clear to auscultation       Cardiovascular hypertension, Normal cardiovascular exam Rhythm:Regular Rate:Normal     Neuro/Psych    GI/Hepatic   Endo/Other  diabetes  Renal/GU      Musculoskeletal   Abdominal (+) + obese,   Peds  Hematology   Anesthesia Other Findings   Reproductive/Obstetrics (+) Pregnancy                             Anesthesia Physical Anesthesia Plan  ASA: III  Anesthesia Plan: Spinal   Post-op Pain Management:    Induction:   PONV Risk Score and Plan: Treatment may vary due to age or medical condition  Airway Management Planned: Natural Airway  Additional Equipment:   Intra-op Plan:   Post-operative Plan:   Informed Consent: I have reviewed the patients History and Physical, chart, labs and discussed the procedure including the risks, benefits and alternatives for the proposed anesthesia with the patient or authorized representative who has indicated his/her understanding and acceptance.   Dental advisory given  Plan Discussed with: CRNA  Anesthesia Plan Comments:         Anesthesia Quick Evaluation

## 2018-03-07 NOTE — H&P (Signed)
Obstetric Preoperative History and Physical  Ellen KaufmannJennifer Martin is a 33 y.o. [phone] with di/di twins at 3187w0d presenting for scheduled cesarean section.  No acute concerns.   She has had 3x SVD. She has Type 2 DM on insulin, takes NPH 20 units BID, Novolog 8 units TID with meals. She has been prescribed a sliding scale as well but does not use the sliding scale as she has not been checking her CBGs secondary to inability to afford supplies. States she is compliant with insulin. Also with gestational HTN, no h/o cHTN. H/o PPH hemorrhage requiring transfusion.   Prenatal Course Source of Care: [facility], then transfer to [facility] at 36 weeks Pregnancy complications or risks: Patient Active Problem List   Diagnosis Date Noted  . Supervision of other normal pregnancy, antepartum 03/03/2018  . UTI (urinary tract infection) 01/30/2018  . Pre-existing type 2 diabetes affecting pregnancy, antepartum 01/30/2018  . Gallstone 01/30/2018  . Gestational hypertension 01/28/2018  . Dichorionic diamniotic twin pregnancy in third trimester 01/25/2018   She plans to bottle feed She desires Depo-Provera for postpartum contraception.   Prenatal labs and studies: ABO, Rh: --/--/O POS, O POS Performed at [facility], [address] [place], [place] [address]  [phone](11/23 [phone]) Antibody: NEG (11/23 [id number]) Rubella: Immune (08/14 0000) RPR: Nonreactive (08/14 0000)  HBsAg: Negative (08/14 0000)  HIV: Non-reactive (08/14 0000)  GNF:AOZHYQMVGBS:Positive (12/31 0928) 3 hr Glucola  n/a Genetic screening insufficient fetal fraction Anatomy US normal  Prenatal Transfer Tool  Fetal Ultrasounds or other Referrals:  Fetal echo, Referred to Materal Fetal Medicine  Maternal Substance Abuse:  No Significant Maternal Medications:  Meds include: Other: insulin Significant Maternal Lab Results: None  Past Medical History:  Diagnosis Date  . Blood transfusion without reported diagnosis   . Diabetes mellitus without complication  (HCC)    Type 2  . Hypertension     Past Surgical History:  Procedure Laterality Date  . NO PAST SURGERIES      OB History  Gravida Para Term Preterm AB Living  7 3 3   3 3   SAB TAB Ectopic Multiple Live Births  3            # Outcome Date GA Lbr Len/2nd Weight Sex Delivery Anes PTL Lv  7 Current           6 SAB           5 SAB           4 SAB           3 Term      Vag-Spont        Birth Comments: PPH with 2 units or blood PP  2 Term      Vag-Spont        Birth Comments: heavy bleeding post partum stopped with a shot  1 Term      Vag-Spont       Social History   Socioeconomic History  . Marital status: Single    Spouse name: Not on file  . Number of children: Not on file  . Years of education: Not on file  . Highest education level: Not on file  Occupational History  . Occupation: unemployed  Social Needs  . Financial resource strain: Not hard at all  . Food insecurity:    Worry: Never true    Inability: Never true  . Transportation needs:    Medical: Yes    Non-medical: Not on file  Tobacco  Use  . Smoking status: Current Every Day Smoker    Packs/day: 0.50  . Smokeless tobacco: Never Used  Substance and Sexual Activity  . Alcohol use: Not Currently  . Drug use: Not Currently  . Sexual activity: Yes  Lifestyle  . Physical activity:    Days per week: Not on file    Minutes per session: Not on file  . Stress: To some extent  Relationships  . Social connections:    Talks on phone: Not on file    Gets together: Not on file    Attends religious service: Not on file    Active member of club or organization: Not on file    Attends meetings of clubs or organizations: Not on file    Relationship status: Not on file  Other Topics Concern  . Not on file  Social History Narrative  . Not on file    Family History  Problem Relation Age of Onset  . Hypertension Mother   . Diabetes Mother   . Arthritis Mother   . Obesity Mother   . Hypertension Maternal  Grandmother   . Diabetes Maternal Grandmother   . Arthritis Maternal Grandmother   . COPD Maternal Uncle   . Miscarriages / Stillbirths Other     Medications Prior to Admission  Medication Sig Dispense Refill Last Dose  . insulin aspart (NOVOLOG) 100 UNIT/ML injection Inject 8 Units into the skin 3 (three) times daily with meals. 10 mL 11   . insulin NPH Human (HUMULIN N,NOVOLIN N) 100 UNIT/ML injection Inject 0.2 mLs (20 Units total) into the skin 2 (two) times daily at 8 am and 10 pm. 10 mL 11   . insulin aspart (NOVOLOG) 100 UNIT/ML injection Inject 0-14 Units into the skin 3 (three) times daily after meals. Question Answer Comment CBG < 60: call physician  CBG 70 - 90 (dose in units): 0  CBG 91 - 120 (dose in units): 1  CBG 121 - 160 (dose in units): 2  CBG 161 - 200 (dose in units): 3  CBG 201 - 240 (dose in units): 4  CBG 241 - 280 (dose in units): 6  CBG 281 - 320 (dose in units): 8  CBG 321 - 360 (dose in units): 10  CBG 361 - 400 (dose in units): 12  CBG > 400 14 units and call physician (Patient not taking: Reported on 03/06/2018) 10 mL 11 Not Taking at Unknown time  . Lancets (ACCU-CHEK MULTICLIX) lancets Use as directed 100 each 5   . metoCLOPramide (REGLAN) 10 MG tablet Take 1 tablet (10 mg total) by mouth every 6 (six) hours as needed for nausea (nausea/headache). (Patient not taking: Reported on 03/06/2018) 8 tablet 0 Not Taking at Unknown time    No Known Allergies  Review of Systems: Negative except for what is mentioned in HPI.  Physical Exam: BP 128/88   Pulse (!) 106   Temp (!) 97.4 F (36.3 C) (Oral)   Resp 20   Ht 5\' 9"  (1.753 m)   Wt 128.2 kg   LMP 07/12/2017   BMI 41.75 kg/m  CONSTITUTIONAL: Well-developed, well-nourished female in no acute distress. Morbidly obese. HENT:  Normocephalic, atraumatic, External right and left ear normal. Oropharynx is clear and moist EYES: Conjunctivae and EOM are normal. Pupils are equal, round, and reactive to light. No  scleral icterus.  NECK: Normal range of motion, supple, no masses SKIN: Skin is warm and dry. No rash noted. Not diaphoretic. No erythema.  No pallor. NEUROLGIC: Alert and oriented to person, place, and time. Normal reflexes, muscle tone coordination. No cranial nerve deficit noted. PSYCHIATRIC: Normal mood and affect. Normal behavior. Normal judgment and thought content. CARDIOVASCULAR: Normal heart rate noted, regular rhythm RESPIRATORY: Effort and breath sounds normal, no problems with respiration noted ABDOMEN: Soft, nontender, nondistended, gravid. Well-healed Pfannenstiel incision. PELVIC: Deferred MUSCULOSKELETAL: Normal range of motion. No edema and no tenderness. 2+ distal pulses.  Pertinent Labs/Studies:   No results found for this or any previous visit (from the past 72 hour(s)).  Assessment and Plan :Yocelin Plack is a 33 y.o. [id number] at [redacted]w[redacted]d being admitted for induction of labor vs scheduled primary c-section for fetal malpresentation. Last [place] was 01/30/18 with 16% discordance with Twin A being bigger twin and breech. Discussed that we will obtain MFM [place] for growth and fluid prior to proceeding with c-section, that if Twin A is vtx and otherwise reassuring, may proceed with induction of labor. Reviewed risks of breech extraction of second twin. She verbalizes understanding of the above and desires to proceed with induction if possible. Reviewed that if first twin is non-cephalic, would recommend proceeding with primary c-section, she verbalizes understanding of the above.   Patient has been NPO since 8 pm, she will remain NPO for procedure Anesthesia and OR aware  Will make plan once [place] has been completed.   Baldemar Lenis, M.D. Attending Obstetrician & Gynecologist, [facility] for [employer], [facility]

## 2018-03-07 NOTE — Anesthesia Procedure Notes (Signed)
Spinal  Patient location during procedure: OB Start time: 03/07/2018 3:59 PM End time: 03/07/2018 4:04 PM Staffing Anesthesiologist: Lowella Curb, MD Performed: anesthesiologist  Preanesthetic Checklist Completed: patient identified, surgical consent, pre-op evaluation, timeout performed, IV checked, risks and benefits discussed and monitors and equipment checked Spinal Block Patient position: sitting Prep: site prepped and draped and DuraPrep Patient monitoring: heart rate, cardiac monitor, continuous pulse ox and blood pressure Approach: midline Location: L3-4 Injection technique: single-shot Needle Needle type: Pencan  Needle gauge: 24 G Needle length: 10 cm Assessment Sensory level: T4

## 2018-03-07 NOTE — Op Note (Addendum)
Cesarean Section Operative Report  PATIENT: Ellen Martin  PROCEDURE DATE: 03/07/2018  PREOPERATIVE DIAGNOSES: Intrauterine pregnancy at [redacted]w[redacted]d weeks gestation; malpresentation: di-di twins , breech/transverse, T2DM, gestational HTN  POSTOPERATIVE DIAGNOSES: The same  PROCEDURE: Primary Low Transverse Cesarean Section  SURGEON:   Surgeon(s) and Role:    * Conan Bowens, MD - Primary    * Arvilla Market, DO - OB Fellow  An experienced assistant was required given the standard of surgical care given the complexity of the case.  This assistant was needed for exposure, dissection, suctioning, retraction, instrument exchange, assisting with delivery with administration of fundal pressure, and for overall help during the procedure.   INDICATIONS: Ellen Martin is a 33 y.o. [id number] at [redacted]w[redacted]d here for cesarean section secondary to the indications listed under preoperative diagnoses; please see preoperative note for further details.  The risks of cesarean section were discussed with the patient including but were not limited to: bleeding which may require transfusion or reoperation; infection which may require antibiotics; injury to bowel, bladder, ureters or other surrounding organs; injury to the fetus; need for additional procedures including hysterectomy in the event of a life-threatening hemorrhage; placental abnormalities wth subsequent pregnancies, incisional problems, thromboembolic phenomenon and other postoperative/anesthesia complications.  The patient concurred with the proposed plan, giving informed written consent for the procedure.    FINDINGS:  Viable female infant in breech presentation.  Apgars 7 and 9.  Viable female infant delivered from cephalic presentation. Apgars 8 and 9.   Clear amniotic fluid.  Intact placenta, one three vessel cord, one two vessel cord.  Normal uterus, fallopian tubes and ovaries bilaterally.  ANESTHESIA: Spinal INTRAVENOUS FLUIDS: 2300  ESTIMATED  BLOOD LOSS: 1200 mL  URINE OUTPUT:  100 ml SPECIMENS: Placenta sent to L&D COMPLICATIONS: None immediate  PROCEDURE IN DETAIL:  The patient preoperatively received intravenous antibiotics and had sequential compression devices applied to her lower extremities.  She was then taken to the operating room where spinal anesthesia was administered and was found to be adequate. She was then placed in a dorsal supine position with a leftward tilt, and prepped and draped in a sterile manner.  A foley catheter was placed into her bladder and attached to constant gravity.    After an adequate timeout was performed, a Pfannenstiel skin incision was made with scalpel and carried through to the underlying layer of fascia. The fascia was incised in the midline, and this incision was extended bilaterally using the Mayo scissors.  Kocher clamps were applied to the superior aspect of the fascial incision and the underlying rectus muscles were dissected off bluntly.  A similar process was carried out on the inferior aspect of the fascial incision. The rectus muscles were separated in the midline bluntly and the peritoneum was entered bluntly. Attention was turned to the lower uterine segment where a low transverse hysterotomy was made with a scalpel and extended bilaterally bluntly.  Baby A was successfully delivered from breech presentation, one leg then second leg delivered, buttocks and trunk, then infant was gently rotated either directed to facilitate delivery of arms, then lifted and head delivered easily, the cord was clamped and cut immediately, and the infant was handed over to the awaiting neonatology team. Baby B infant was successfully delivered from cephalic position, the cord was clamped and cut immediately, and the infant was handed over to the awaiting neonatology team. Uterine massage was then administered, and the placenta delivered intact. The uterus was then cleared of clots and debris.  The hysterotomy was  closed with 0 Vicryl in a running locked fashion, and an imbricating layer was also placed with 0 Vicryl.   The pelvis was cleared of all clot and debris. Hemostasis was confirmed on all surfaces.  The peritoneum was closed with a 0 Vicryl running stitch. The fascia was then closed using 0 PDS in a running fashion.  The subcutaneous layer was irrigated, then reapproximated with 2-0 plain gut in a running fashion.  The skin was closed with a 4-0 Vicryl subcuticular stitch.   The patient tolerated the procedure well. Sponge, lap, instrument and needle counts were correct x 3.  She was taken to the recovery room in stable condition.   An experienced assistant was required given the standard of surgical care given the complexity of the case.  This assistant was needed for exposure, dissection, suctioning, retraction, instrument exchange, assisting with delivery with administration of fundal pressure, and for overall help during the procedure.   Maternal Disposition: PACU - hemodynamically stable.   Infant Disposition: stable   Marcy Siren, D.O. OB Fellow  03/07/2018, 5:31 PM    Attestation of Attending Supervision of OB Fellow: Evaluation and management procedures were performed by the [facility] Fellow under my supervision and collaboration.  I have reviewed the OB Fellow's note and chart, and I agree with the management and plan.  Baldemar Lenis, M.D. Attending Center for [employer] (Faculty Practice)  03/10/2018 10:40 AM

## 2018-03-08 ENCOUNTER — Encounter (HOSPITAL_COMMUNITY): Payer: Self-pay | Admitting: Obstetrics and Gynecology

## 2018-03-08 LAB — CBC
HCT: 34.5 % — ABNORMAL LOW (ref 36.0–46.0)
Hemoglobin: 11.9 g/dL — ABNORMAL LOW (ref 12.0–15.0)
MCH: 31.5 pg (ref 26.0–34.0)
MCHC: 34.5 g/dL (ref 30.0–36.0)
MCV: 91.3 fL (ref 80.0–100.0)
NRBC: 0 % (ref 0.0–0.2)
Platelets: 138 10*3/uL — ABNORMAL LOW (ref 150–400)
RBC: 3.78 MIL/uL — AB (ref 3.87–5.11)
RDW: 13.6 % (ref 11.5–15.5)
WBC: 7.9 10*3/uL (ref 4.0–10.5)

## 2018-03-08 LAB — CREATININE, SERUM
Creatinine, Ser: 0.52 mg/dL (ref 0.44–1.00)
GFR calc Af Amer: >60 mL/min (ref >60)

## 2018-03-08 LAB — GLUCOSE, CAPILLARY
Glucose-Capillary: 95 mg/dL (ref 70–99)
Glucose-Capillary: 104 mg/dL — ABNORMAL HIGH (ref 70–99)
Glucose-Capillary: 116 mg/dL — ABNORMAL HIGH (ref 70–99)
Glucose-Capillary: 137 mg/dL — ABNORMAL HIGH (ref 70–99)

## 2018-03-08 LAB — RPR: RPR Ser Ql: NONREACTIVE

## 2018-03-08 NOTE — Anesthesia Postprocedure Evaluation (Signed)
Anesthesia Post Note  Patient: Ellen KaufmannJennifer Fisch  Procedure(s) Performed: CESAREAN SECTION MULTI-GESTATIONAL (N/A )     Patient location during evaluation: Mother Baby Anesthesia Type: Spinal Level of consciousness: awake and alert Pain management: pain level controlled Vital Signs Assessment: post-procedure vital signs reviewed and stable Respiratory status: spontaneous breathing, nonlabored ventilation and respiratory function stable Cardiovascular status: stable Postop Assessment: no headache, no backache, no apparent nausea or vomiting, patient able to bend at knees, spinal receding, adequate PO intake and able to ambulate Anesthetic complications: no    Last Vitals:  Vitals:   03/08/18 0520 03/08/18 0723  BP:  108/71  Pulse:  62  Resp:    Temp:  36.8 C  SpO2: 100% 100%    Last Pain:  Vitals:   03/08/18 0723  TempSrc: Oral  PainSc:    Pain Goal:                 Laban EmperorMalinova,Ares Cardozo Hristova

## 2018-03-08 NOTE — Progress Notes (Signed)
Subjective: Postpartum Day #1: Cesarean Delivery Patient reports tolerating PO. Foley still in place; felt lightheaded when standing earlier. Bottlefeeding babies; desires DMPA for contraception.  Objective: Vital signs in last 24 hours: Temp:  [97.4 F (36.3 C)-98.5 F (36.9 C)] 98.2 F (36.8 C) (01/04 0723) Pulse Rate:  [59-106] 62 (01/04 0723) Resp:  [12-23] 19 (01/04 0444) BP: (98-154)/(67-93) 108/71 (01/04 0723) SpO2:  [99 %-100 %] 100 % (01/04 0723) Weight:  [128.2 kg] 128.2 kg (01/03 1105)  Physical Exam:  General: alert, cooperative and no distress Lochia: appropriate Uterine Fundus: firm Incision: Prevena in place DVT Evaluation: No evidence of DVT seen on physical exam.  Recent Labs    03/07/18 1120 03/08/18 0604  HGB 15.0 11.9*  HCT 42.9 34.5*    Assessment/Plan: Status post Cesarean section. Doing well postoperatively.  Continue current care. SW consult. Hgb stable- expect that pt will be able to ambulate today.  Arabella Merles CNM 03/08/2018, 10:01 AM

## 2018-03-08 NOTE — Clinical Social Work Maternal (Signed)
CLINICAL SOCIAL WORK MATERNAL/CHILD NOTE  Patient Details  Name: Ellen Martin MRN: [id number] Date of Birth: 10/13/1985  Date:  03/08/2018  Clinical Social Worker Initiating Note:  Zyra Parrillo Boyd-Gilyard Date/Time: Initiated:  03/08/18/1306     Child's Name:  [name] and Angela Perez-Lacher   Biological Parents:  Mother, Father   Need for Interpreter:  None   Reason for Referral:  Current Substance Use/Substance Use During Pregnancy    Address:  [address] [place] [address]    Phone number:  [phone] (home)     Additional phone number:   Household Members/Support Persons (HM/SP):   Household Member/Support Person 1, Household Member/Support Person 2, Household Member/Support Person 3, Household Member/Support Person 4(MOB reported that MOB has 2 older children that has not been in her custody since 2010.  Per MOB her older daughters lives with MOB's cousin Shelly Amen in the state of [place]. )   HM/SP Name Relationship DOB or Age  HM/SP -1 [name] PErez FOB  03/19/1982  HM/SP -2 Andra Pelland daughter 01/11/2006  HM/SP -3 Laura Boling daughter 04/15/2007  HM/SP -4 Annie Tenorio daughter 11/15/12  HM/SP -5        HM/SP -6        HM/SP -7        HM/SP -8          Natural Supports (not living in the home):  Parent, Extended Family   Professional Supports: None   Employment: Unemployed   Type of Work:     Education:  High school graduate   Homebound arranged: No  Financial Resources:  Medicaid   Other Resources:  WIC, Food Stamps    Cultural/Religious Considerations Which May Impact Care:  none reported  Strengths:  Ability to meet basic needs , Home prepared for child , Pediatrician chosen   Psychotropic Medications:         Pediatrician:    [place] area  Pediatrician List:   [facility]   [place]    [place]    [place]    [place]    [place]      Pediatrician Fax Number:     Risk Factors/Current Problems:  Substance Use    Cognitive State:  Alert , Able to Concentrate , Linear Thinking , Insightful    Mood/Affect:  Interested , Relaxed , Comfortable , Happy , Bright    CSW Assessment: CSW met with  patient in room 146 to complete an assessment for SA hx. When CSW arrived, patient was resting in bed and twins were asleep in bassinets.  FOB and MGM were also present and with patient's permission CSW asked them to leave the room in order to meet with patient in private. Patient was polite, easy to engage, and receptive to meeting with CSW.   CSW inquired about patient's substance use during pregnancy and informed her about positive UDS (amphetamines and THC) on 01/25/2018. Patient reported that she smoked marijuana to assist with nausea and that she only used amphetamines one time when she and FOB were having some relationship problems. CSW encouraged patient to utilize healthy coping strategies in the future and provided patient with some examples. Patient reported that she did not plan on using substances anymore. CSW offered patient substance use treatment resources, Patient declined and reported that she did not have a problem. CSW made patient aware of hospital's substance abuse policy and patient was understanding. patient is aware that twins UDS results are   negative and that CSW will continue to monitor CDS results. CSW will make a report to [place] CPS if results are positive without an explanation.   CSW inquired about patient's support system, patient reported that the father of the twin and patient's mother is her primary support team. Patient reported having all essential items for twins and feeling prepared to parent.     CSW Plan/Description:  No Further Intervention Required/No Barriers to Discharge, Sudden Infant Death Syndrome (SIDS) Education, Perinatal Mood and Anxiety Disorder (PMADs) Education, Other Patient/Family Education, Hospital  Drug Screen Policy Information, Other Information/Referral to Community Resources, CSW Will Continue to Monitor Umbilical Cord Tissue Drug Screen Results and Make Report if Warranted   Cerina Leary Boyd-Gilyard, MSW, LCSW Clinical Social Work [phone]   Tamario Heal D BOYD-GILYARD, LCSW 03/08/2018, 1:19 PM 

## 2018-03-08 NOTE — Anesthesia Postprocedure Evaluation (Signed)
Anesthesia Post Note  Patient: Ellen Martin  Procedure(s) Performed: CESAREAN SECTION MULTI-GESTATIONAL (N/A )     Patient location during evaluation: PACU Anesthesia Type: Spinal Level of consciousness: oriented and awake and alert Pain management: pain level controlled Vital Signs Assessment: post-procedure vital signs reviewed and stable Respiratory status: spontaneous breathing and respiratory function stable Cardiovascular status: blood pressure returned to baseline and stable Postop Assessment: no headache, no backache and no apparent nausea or vomiting Anesthetic complications: no    Last Vitals:  Vitals:   03/08/18 0444 03/08/18 0520  BP: 98/86   Pulse: 66   Resp: 19   Temp: 36.7 C   SpO2: 99% 100%    Last Pain:  Vitals:   03/08/18 0444  TempSrc: Oral  PainSc: 0-No pain   Pain Goal:                 Lowella Curb

## 2018-03-08 NOTE — Clinical Social Work Maternal (Signed)
CSW inquired about patient's substance use during pregnancy and informed her about positive UDS for (amphetamines and THC). Patient reported that she smoked marijuana to assist with nausea and that she only used amphetamines one time. CSW inquired about why MOB used amphetamines, patient reported that she was going through stuff and used it with a friend. CSW asked patient how she usually copes with things other than drug use, patient reported that she usually talks to her supports but that she didn't have anyone to talk to. CSW and patient discussed healthy coping skills and how the use of substances was not good for the babies. Patient reported that she did not plan on using substances anymore. CSW offered patient substance use treatment resources, Patient declined and reported that she did not have a problem. CSW inquired about patient's support system, patient reported that she has the father of her twins, her mother and her neighbors. Patient reported that her mother is coming from [name] to stay with her for a couple months to help take care of the babies after she delivers. Patient reported that she is happy that her mother is coming to stay with her. Patient reported that her neighbors are also supportive and will be the god parents of the twins when they are born.

## 2018-03-08 NOTE — Addendum Note (Signed)
Addendum  created 03/08/18 [id number] by Elgie CongoMalinova, Kabella Cassidy H, CRNA   Clinical Note Signed

## 2018-03-09 LAB — GLUCOSE, CAPILLARY: Glucose-Capillary: 93 mg/dL (ref 70–99)

## 2018-03-09 MED ORDER — IBUPROFEN 800 MG PO TABS: 800.0000 mg | ORAL_TABLET | Freq: Three times a day (TID) | ORAL | 0 refills | Status: DC

## 2018-03-09 MED ORDER — METFORMIN HCL 500 MG PO TABS: 500.0000 mg | ORAL_TABLET | Freq: Two times a day (BID) | ORAL | 3 refills | Status: DC

## 2018-03-09 MED ORDER — METFORMIN HCL 500 MG PO TABS
500.0000 mg | ORAL_TABLET | Freq: Two times a day (BID) | ORAL | Status: DC
  Administered 2018-03-09: 500 mg via ORAL
  Filled 2018-03-09: qty 1

## 2018-03-09 MED ORDER — OXYCODONE HCL 5 MG PO TABS: 5.0000 mg | ORAL_TABLET | Freq: Four times a day (QID) | ORAL | 0 refills | Status: AC | PRN

## 2018-03-09 MED ORDER — ACETAMINOPHEN 325 MG PO TABS
650.0000 mg | ORAL_TABLET | Freq: Four times a day (QID) | ORAL | Status: DC | PRN
  Administered 2018-03-09: 650 mg via ORAL
  Filled 2018-03-09 (×2): qty 2

## 2018-03-09 MED ORDER — SENNOSIDES-DOCUSATE SODIUM 8.6-50 MG PO TABS: 2.0000 | ORAL_TABLET | ORAL | 0 refills | Status: DC

## 2018-03-09 NOTE — Discharge Summary (Signed)
Obstetrics Discharge Summary OB/GYN Faculty Practice   Patient Name: Ellen Martin DOB: 05/20/1985 MRN: [id number]  Date of admission: 03/07/2018 Delivering MD:    Ellen Martin [[id number]]  Ellen Martin [[id number]]  Ellen Martin  Date of discharge: 03/11/2018  Admitting diagnosis: Twin Malpresentation GHTN Intrauterine pregnancy: [redacted]w[redacted]d     Secondary diagnosis:   Active Problems:   Dichorionic diamniotic twin pregnancy in third trimester   Gestational hypertension   Pre-existing type 2 diabetes affecting pregnancy, antepartum   Supervision of other normal pregnancy, antepartum   Twin pregnancy, twins dichorionic and diamniotic   Status post primary low transverse cesarean section  Additional problems:  . Limited prenatal care . Transfer of care from outside office given limited prenatal care - at 36 weeks      Discharge diagnosis: Term di-di twin pregnancy delivery by Cesarean section                                            Postpartum procedures: Prevena wound vac prevention placed  Complications: postpartum hemorrhage (1200cc)  Outpatient Follow-Up: [ ]  titrate metformin to 1000mg  BID [ ]  check A1C [ ]  refer to primary care to establish care  [ ]  BP check for gestational hypertension  [ ]  Depo injection requested   Hospital course: Ellen Martin is a 33 y.o. [redacted]w[redacted]d who was admitted for scheduled primary section for fetal malpresentation versus induction of labor. Malpresentation on admission noted to be breech/transverse. Her pregnancy was complicated by the above noted items. Delivery was complicated by postpartum hemorrhage. Please see delivery/op note for additional details. Her postpartum course was uncomplicated. She was transitioned to NPH 10U BID with good control of her blood sugars. She has had trouble monitoring her blood sugars regularly at home and was not on insulin prior to pregnancy. We will transition to metformin  500mg  BID at discharge.   She was formula feeding. By day of discharge, she was passing flatus, urinating, eating and drinking without difficulty. Her pain was well-controlled, and she was discharged home with ibuprofen and oxycodone 5mg  #20. She will follow-up in clinic in 1-2 weeks for incision and BP check as well as in 4-6 weeks for routine postpartum visit.   Physical exam  Vitals:   03/08/18 1928 03/08/18 1931 03/09/18 0507 03/09/18 1514  BP:  106/60 139/79 137/86  Pulse:  69 67 69  Resp:  18 18 17   Temp: 98.1 F (36.7 C) 98.3 F (36.8 C) 98 F (36.7 C) 97.9 F (36.6 C)  TempSrc: Oral Oral Oral Oral  SpO2:  100%  100%  Weight:      Height:       General: well-appearing, NAD Lochia: appropriate Uterine Fundus: firm Incision: Prevena wound vac in place, no leakage noted DVT Evaluation: No significant calf/ankle edema. Labs: Lab Results  Component Value Date   WBC 7.9 03/08/2018   HGB 11.9 (L) 03/08/2018   HCT 34.5 (L) 03/08/2018   MCV 91.3 03/08/2018   PLT 138 (L) 03/08/2018   CMP Latest Ref Rng & Units 03/08/2018  Glucose 70 - 99 mg/dL -  BUN 6 - 20 mg/dL -  Creatinine 7.54 - 4.92 mg/dL 0.10  Sodium 071 - 219 mmol/L -  Potassium 3.5 - 5.1 mmol/L -  Chloride 98 - 111 mmol/L -  CO2 22 - 32 mmol/L -  Calcium 8.9 - 10.3 mg/dL -  Total Protein 6.5 - 8.1 g/dL -  Total Bilirubin 0.3 - 1.2 mg/dL -  Alkaline Phos 38 - 833 U/L -  AST 15 - 41 U/L -  ALT 0 - 44 U/L -    Discharge instructions: Per After Visit Summary and "Baby and Me Booklet"  After visit meds:  Allergies as of 03/09/2018   No Known Allergies     Medication List    STOP taking these medications   accu-chek multiclix lancets   insulin aspart 100 UNIT/ML injection Commonly known as:  novoLOG   insulin NPH Human 100 UNIT/ML injection Commonly known as:  HUMULIN N,NOVOLIN N   metoCLOPramide 10 MG tablet Commonly known as:  REGLAN     TAKE these medications   ibuprofen 800 MG tablet Commonly  known as:  ADVIL,MOTRIN Take 1 tablet (800 mg total) by mouth 3 (three) times daily.   metFORMIN 500 MG tablet Commonly known as:  GLUCOPHAGE Take 1 tablet (500 mg total) by mouth 2 (two) times daily with a meal. For diabetes   oxyCODONE 5 MG immediate release tablet Commonly known as:  Oxy IR/ROXICODONE Take 1 tablet (5 mg total) by mouth every 6 (six) hours as needed for up to 5 days for severe pain.   senna-docusate 8.6-50 MG tablet Commonly known as:  Senokot-S Take 2 tablets by mouth daily.            Discharge Care Instructions  (From admission, onward)         Start     Ordered   03/09/18 0000  Discharge wound care:    Comments:  Remove dressing/wound vac after 7 days.   03/09/18 0154         Postpartum contraception: Depo Provera Diet: Routine Diet Activity: Advance as tolerated. Pelvic rest for 6 weeks.   Follow-up Appt: Future Appointments  Date Time Provider Department Center  03/12/2018  2:15 PM [facility] NURSE [facility] None  03/24/2018  1:45 PM Constant, Gigi Gin, MD [facility] None  04/07/2018  2:00 PM Constant, Gigi Gin, MD [facility] None   Follow-up Visit:No follow-ups on file.  Newborn Data:   Ellen Martin [[id number]]  Live born female  Birth Weight: 6 lb 0.1 oz (2725 g) APGAR: 7, 9  Newborn Delivery   Birth date/time:  03/07/2018 16:41:00 Delivery type:  C-Section, Low Transverse Trial of labor:  No C-section categorization:  Primary      Ellen Martin [[id number]]  Live born female  Birth Weight: 4 lb 7.6 oz (2030 g) APGAR: 8, 9  Newborn Delivery   Birth date/time:  03/07/2018 16:42:00 Delivery type:  C-Section, Low Transverse Trial of labor:  No C-section categorization:  Primary     Baby Feeding: Bottle Disposition:home with mother  Ellen Deer. Earlene Plater, DO OB/GYN Fellow, Faculty Practice

## 2018-03-09 NOTE — Progress Notes (Signed)
On-call for Faculty Practice called to change orders this am to what mom will be taking at home to control her type 2 DM - which is oral metformin 500 mg BID with meals. This RN received an order for metformin this am and to discontinue the NPH based on a CBG of 93.

## 2018-03-09 NOTE — Progress Notes (Signed)
Subjective: POD#2 Cesarean section Patient reports no complaints. States passing gas, pain well-controlled, minimal bleeding. Pain only bad with coughing. Has not noticed any beeping with Prevena. Baby girl is in NICU, baby boy in room but unclear dispo plan.   Objective: Vital signs in last 24 hours: Temp:  [98 F (36.7 C)-98.3 F (36.8 C)] 98 F (36.7 C) (01/05 0507) Pulse Rate:  [67-70] 67 (01/05 0507) Resp:  [18] 18 (01/05 0507) BP: (106-139)/(60-79) 139/79 (01/05 0507) SpO2:  [100 %] 100 % (01/04 1931)  Physical Exam:  General: well-appearing, tired Lochia: appropriate Uterine Fundus: firm Incision: Prevena wound vac in place, appears to be sealed well, no discharge noted in canister DVT Evaluation: No significant calf/ankle edema.  Recent Labs    03/07/18 1120 03/08/18 0604  HGB 15.0 11.9*  HCT 42.9 34.5*    Assessment/Plan: Status post Cesarean section. Doing well postoperatively.  Continue current care. Likely d/c home tomorrow. Bottle feeding. CBG well-controlled over last 24-hours on NPH 10U BID. Will continue while admitted and likely switch to metformin on discharge as not on insulin prior to pregnancy.   Tamera Stands 03/09/2018, 9:25 AM

## 2018-03-10 LAB — TYPE AND SCREEN
ABO/RH(D): O POS
Antibody Screen: NEGATIVE
Unit division: 0
Unit division: 0

## 2018-03-10 LAB — BPAM RBC
Blood Product Expiration Date: 202001162359
Blood Product Expiration Date: 202001162359
UNIT TYPE AND RH: 5100
Unit Type and Rh: 5100

## 2018-03-10 LAB — GLUCOSE, CAPILLARY: Glucose-Capillary: 89 mg/dL (ref 70–99)

## 2018-03-12 ENCOUNTER — Other Ambulatory Visit (HOSPITAL_COMMUNITY): Payer: Medicaid Other

## 2018-03-12 NOTE — Progress Notes (Deleted)
Subjective:  Ellen Martin is a 33 y.o. female here for BP check.   Hypertension ROS: {htn cvs ros:315727::"taking medications as instructed","no medication side effects noted","no TIA's","no chest pain on exertion","no dyspnea on exertion","no swelling of ankles"}.    Objective:  There were no vitals taken for this visit.  Appearance {appearance:315021::"alert, well appearing, and in no distress"}. General exam BP noted to be well controlled today in office.    Assessment:   Blood Pressure {disease control degree:315147}.   Plan:  {disease follow up plans:315730}.

## 2018-03-24 ENCOUNTER — Ambulatory Visit (INDEPENDENT_AMBULATORY_CARE_PROVIDER_SITE_OTHER): Payer: Medicaid Other | Admitting: Obstetrics and Gynecology

## 2018-03-24 ENCOUNTER — Encounter: Payer: Self-pay | Admitting: Obstetrics and Gynecology

## 2018-03-24 VITALS — BP 123/85 | HR 101 | Wt 254.3 lb

## 2018-03-24 DIAGNOSIS — Z5189 Encounter for other specified aftercare: Secondary | ICD-10-CM | POA: Diagnosis not present

## 2018-03-24 DIAGNOSIS — Z9889 Other specified postprocedural states: Secondary | ICD-10-CM

## 2018-03-24 NOTE — Progress Notes (Signed)
33 yo P4035 s/p primary cwesarean section due to breech presentation on 03/07/2018 here for incision check. Patient is using formula and reports infants are thriving. She denies any concerns regarding postpartum depression. Incisional pain is well controlled with ibuprofen  Past Medical History:  Diagnosis Date  . Blood transfusion without reported diagnosis   . Diabetes mellitus without complication (HCC)    Type 2  . Hypertension    Past Surgical History:  Procedure Laterality Date  . CESAREAN SECTION MULTI-GESTATIONAL N/A 03/07/2018   Procedure: CESAREAN SECTION MULTI-GESTATIONAL;  Surgeon: Conan Bowens, MD;  Location: [facility];  Service: Obstetrics;  Laterality: N/A;  . NO PAST SURGERIES     Family History  Problem Relation Age of Onset  . Hypertension Mother   . Diabetes Mother   . Arthritis Mother   . Obesity Mother   . Hypertension Maternal Grandmother   . Diabetes Maternal Grandmother   . Arthritis Maternal Grandmother   . COPD Maternal Uncle   . Miscarriages / [place] Other    Social History   Tobacco Use  . Smoking status: Current Every Day Smoker    Packs/day: 0.50  . Smokeless tobacco: Never Used  Substance Use Topics  . Alcohol use: Not Currently  . Drug use: Not Currently   ROS See pertinent in HPI  Blood pressure 123/85, pulse (!) 101, weight 254 lb 4.8 oz (115.3 kg), not currently breastfeeding.  GENERAL: Well-developed, well-nourished female in no acute distress.  ABDOMEN: Soft, nontender, nondistended.  Incision: healing well. No erythema, induration or drainage  EXTREMITIES: No cyanosis, clubbing, or edema, 2+ distal pulses.  A/P 33 yo s/p cesarean section on 1/3 here for incision check - Incision healing well - Follow up as scheduled on 2/3 for postpartum visit - Abstinence until 2/3. Patient desires depo-provera for contraception

## 2018-03-24 NOTE — Progress Notes (Signed)
Pt had c section 03-07-18. Pt denies any abnormal symptoms with her incision. Pt complains of some pain on left side of labia.

## 2018-04-07 ENCOUNTER — Ambulatory Visit: Payer: Medicaid Other | Admitting: Obstetrics and Gynecology

## 2018-04-09 ENCOUNTER — Ambulatory Visit: Payer: Medicaid Other | Admitting: Certified Nurse Midwife

## 2018-04-11 ENCOUNTER — Encounter: Payer: Self-pay | Admitting: Certified Nurse Midwife

## 2018-05-20 ENCOUNTER — Other Ambulatory Visit: Payer: Self-pay

## 2018-05-20 ENCOUNTER — Emergency Department (HOSPITAL_COMMUNITY)
Admission: EM | Admit: 2018-05-20 | Discharge: 2018-05-21 | Disposition: A | Discharge status: Other Acute Inpatient Hospital | Payer: Medicaid Other | Attending: Emergency Medicine | Admitting: Emergency Medicine

## 2018-05-20 ENCOUNTER — Encounter (HOSPITAL_COMMUNITY): Payer: Self-pay | Admitting: Obstetrics and Gynecology

## 2018-05-20 DIAGNOSIS — F17210 Nicotine dependence, cigarettes, uncomplicated: Secondary | ICD-10-CM | POA: Diagnosis not present

## 2018-05-20 DIAGNOSIS — I10 Essential (primary) hypertension: Secondary | ICD-10-CM | POA: Insufficient documentation

## 2018-05-20 DIAGNOSIS — T1491XA Suicide attempt, initial encounter: Secondary | ICD-10-CM | POA: Diagnosis present

## 2018-05-20 DIAGNOSIS — F419 Anxiety disorder, unspecified: Secondary | ICD-10-CM | POA: Insufficient documentation

## 2018-05-20 DIAGNOSIS — F329 Major depressive disorder, single episode, unspecified: Secondary | ICD-10-CM | POA: Insufficient documentation

## 2018-05-20 DIAGNOSIS — R45851 Suicidal ideations: Secondary | ICD-10-CM | POA: Diagnosis not present

## 2018-05-20 DIAGNOSIS — X838XXA Intentional self-harm by other specified means, initial encounter: Secondary | ICD-10-CM | POA: Diagnosis not present

## 2018-05-20 DIAGNOSIS — F4325 Adjustment disorder with mixed disturbance of emotions and conduct: Secondary | ICD-10-CM | POA: Diagnosis present

## 2018-05-20 DIAGNOSIS — F17200 Nicotine dependence, unspecified, uncomplicated: Secondary | ICD-10-CM | POA: Diagnosis not present

## 2018-05-20 DIAGNOSIS — F99 Mental disorder, not otherwise specified: Secondary | ICD-10-CM | POA: Diagnosis present

## 2018-05-20 DIAGNOSIS — O9989 Other specified diseases and conditions complicating pregnancy, childbirth and the puerperium: Secondary | ICD-10-CM | POA: Diagnosis not present

## 2018-05-20 DIAGNOSIS — E119 Type 2 diabetes mellitus without complications: Secondary | ICD-10-CM | POA: Insufficient documentation

## 2018-05-20 HISTORY — DX: Anxiety disorder, unspecified: F41.9

## 2018-05-20 HISTORY — DX: Major depressive disorder, single episode, unspecified: F32.9

## 2018-05-20 HISTORY — DX: Depression, unspecified: F32.A

## 2018-05-20 LAB — CBC
HCT: 38.9 % (ref 36.0–46.0)
Hemoglobin: 12.9 g/dL (ref 12.0–15.0)
MCH: 29.7 pg (ref 26.0–34.0)
MCHC: 33.2 g/dL (ref 30.0–36.0)
MCV: 89.4 fL (ref 80.0–100.0)
Platelets: 251 10*3/uL (ref 150–400)
RBC: 4.35 MIL/uL (ref 3.87–5.11)
RDW: 12.6 % (ref 11.5–15.5)
WBC: 8.1 10*3/uL (ref 4.0–10.5)
nRBC: 0 % (ref 0.0–0.2)

## 2018-05-20 LAB — COMPREHENSIVE METABOLIC PANEL
ALT: 15 U/L (ref 0–44)
AST: 20 U/L (ref 15–41)
Albumin: 4 g/dL (ref 3.5–5.0)
Alkaline Phosphatase: 92 U/L (ref 38–126)
Anion gap: 9 (ref 5–15)
BUN: 18 mg/dL (ref 6–20)
CO2: 26 mmol/L (ref 22–32)
Calcium: 9.1 mg/dL (ref 8.9–10.3)
Chloride: 102 mmol/L (ref 98–111)
Creatinine, Ser: 0.48 mg/dL (ref 0.44–1.00)
GFR calc Af Amer: >60 mL/min (ref >60)
GFR calc non Af Amer: >60 mL/min (ref >60)
Glucose, Bld: 162 mg/dL — ABNORMAL HIGH (ref 70–99)
Potassium: 3.4 mmol/L — ABNORMAL LOW (ref 3.5–5.1)
Sodium: 137 mmol/L (ref 135–145)
Total Bilirubin: 0.7 mg/dL (ref 0.3–1.2)
Total Protein: 7.7 g/dL (ref 6.5–8.1)

## 2018-05-20 LAB — ACETAMINOPHEN LEVEL: Acetaminophen (Tylenol), Serum: <10 ug/mL — ABNORMAL LOW (ref 10–30)

## 2018-05-20 LAB — RAPID URINE DRUG SCREEN, HOSP PERFORMED
Amphetamines: POSITIVE — AB
Barbiturates: NOT DETECTED
Benzodiazepines: NOT DETECTED
Cocaine: NOT DETECTED
Opiates: NOT DETECTED
Tetrahydrocannabinol: POSITIVE — AB

## 2018-05-20 LAB — ETHANOL: Alcohol, Ethyl (B): <10 mg/dL (ref <10)

## 2018-05-20 LAB — PREGNANCY, URINE: PREG TEST UR: NEGATIVE

## 2018-05-20 LAB — SALICYLATE LEVEL

## 2018-05-20 NOTE — ED Notes (Signed)
Pt arrived to unit tearful.  Pt stated that she did not want to hurt herself that she has two month old twins at home.  Pt contracts for safety.  15 minute checks and video monitoring in place.
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
## 2018-05-20 NOTE — ED Notes (Signed)
Moving to acute unit as she is appropriate for the move and her bed is needed for someone currently in the ED.

## 2018-05-20 NOTE — ED Notes (Signed)
Pt has been seen and wand by security. 
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
## 2018-05-20 NOTE — Progress Notes (Signed)
Received Ellen Martin from [place], alert and cooperative. She denied all of the psychiatric symptoms including feeling suicidal. She immediately went to bed. Slept well throughout the night and no behavioral issues.

## 2018-05-20 NOTE — ED Notes (Signed)
Being transferred to room 37.

## 2018-05-20 NOTE — ED Notes (Signed)
Patient agitated. Wanting to leave. Security called, talked with patient and explained procedure. Calm, cooperative.
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
## 2018-05-21 ENCOUNTER — Other Ambulatory Visit: Payer: Self-pay

## 2018-05-21 ENCOUNTER — Inpatient Hospital Stay (HOSPITAL_COMMUNITY)
Admission: AD | Admit: 2018-05-21 | Discharge: 2018-05-23 | DRG: 885 | Disposition: A | Discharge status: Home / Self Care | Payer: Medicaid Other | Attending: Psychiatry | Admitting: Psychiatry

## 2018-05-21 ENCOUNTER — Encounter (HOSPITAL_COMMUNITY): Payer: Self-pay | Admitting: *Deleted

## 2018-05-21 DIAGNOSIS — Z833 Family history of diabetes mellitus: Secondary | ICD-10-CM

## 2018-05-21 DIAGNOSIS — Z791 Long term (current) use of non-steroidal anti-inflammatories (NSAID): Secondary | ICD-10-CM | POA: Diagnosis not present

## 2018-05-21 DIAGNOSIS — T1491XA Suicide attempt, initial encounter: Secondary | ICD-10-CM | POA: Diagnosis not present

## 2018-05-21 DIAGNOSIS — X838XXA Intentional self-harm by other specified means, initial encounter: Secondary | ICD-10-CM

## 2018-05-21 DIAGNOSIS — I10 Essential (primary) hypertension: Secondary | ICD-10-CM | POA: Diagnosis present

## 2018-05-21 DIAGNOSIS — F4325 Adjustment disorder with mixed disturbance of emotions and conduct: Secondary | ICD-10-CM | POA: Diagnosis not present

## 2018-05-21 DIAGNOSIS — F17210 Nicotine dependence, cigarettes, uncomplicated: Secondary | ICD-10-CM | POA: Diagnosis not present

## 2018-05-21 DIAGNOSIS — F4320 Adjustment disorder, unspecified: Secondary | ICD-10-CM | POA: Diagnosis present

## 2018-05-21 DIAGNOSIS — F12980 Cannabis use, unspecified with anxiety disorder: Secondary | ICD-10-CM | POA: Diagnosis not present

## 2018-05-21 DIAGNOSIS — T383X6A Underdosing of insulin and oral hypoglycemic [antidiabetic] drugs, initial encounter: Secondary | ICD-10-CM | POA: Diagnosis present

## 2018-05-21 DIAGNOSIS — F419 Anxiety disorder, unspecified: Secondary | ICD-10-CM | POA: Diagnosis present

## 2018-05-21 DIAGNOSIS — Z8249 Family history of ischemic heart disease and other diseases of the circulatory system: Secondary | ICD-10-CM | POA: Diagnosis not present

## 2018-05-21 DIAGNOSIS — Y92009 Unspecified place in unspecified non-institutional (private) residence as the place of occurrence of the external cause: Secondary | ICD-10-CM | POA: Diagnosis not present

## 2018-05-21 DIAGNOSIS — R45851 Suicidal ideations: Secondary | ICD-10-CM

## 2018-05-21 DIAGNOSIS — E119 Type 2 diabetes mellitus without complications: Secondary | ICD-10-CM | POA: Diagnosis not present

## 2018-05-21 DIAGNOSIS — F321 Major depressive disorder, single episode, moderate: Secondary | ICD-10-CM | POA: Diagnosis not present

## 2018-05-21 DIAGNOSIS — Z915 Personal history of self-harm: Secondary | ICD-10-CM | POA: Diagnosis not present

## 2018-05-21 DIAGNOSIS — R825 Elevated urine levels of drugs, medicaments and biological substances: Secondary | ICD-10-CM

## 2018-05-21 DIAGNOSIS — Z630 Problems in relationship with spouse or partner: Secondary | ICD-10-CM

## 2018-05-21 MED ORDER — METFORMIN HCL 500 MG PO TABS
500.0000 mg | ORAL_TABLET | Freq: Two times a day (BID) | ORAL | Status: DC
  Administered 2018-05-21 – 2018-05-23 (×4): 500 mg via ORAL
  Filled 2018-05-21 (×6): qty 1

## 2018-05-21 MED ORDER — MAGNESIUM HYDROXIDE 400 MG/5ML PO SUSP: 30.0000 mL | Freq: Every day | ORAL | Status: DC | PRN

## 2018-05-21 MED ORDER — PNEUMOCOCCAL VAC POLYVALENT 25 MCG/0.5ML IJ INJ: 0.5000 mL | INJECTION | INTRAMUSCULAR | Status: DC

## 2018-05-21 MED ORDER — HYDROXYZINE HCL 25 MG PO TABS
25.0000 mg | ORAL_TABLET | Freq: Four times a day (QID) | ORAL | Status: DC | PRN
  Administered 2018-05-21 – 2018-05-22 (×2): 25 mg via ORAL
  Filled 2018-05-21 (×3): qty 1

## 2018-05-21 MED ORDER — TRAZODONE HCL 50 MG PO TABS
50.0000 mg | ORAL_TABLET | Freq: Every evening | ORAL | Status: DC | PRN
  Administered 2018-05-21 – 2018-05-22 (×2): 50 mg via ORAL
  Filled 2018-05-21 (×2): qty 1

## 2018-05-21 MED ORDER — ALUM & MAG HYDROXIDE-SIMETH 200-200-20 MG/5ML PO SUSP: 30.0000 mL | ORAL | Status: DC | PRN

## 2018-05-21 MED ORDER — ACETAMINOPHEN 325 MG PO TABS: 650.0000 mg | ORAL_TABLET | Freq: Four times a day (QID) | ORAL | Status: DC | PRN

## 2018-05-21 NOTE — BH Assessment (Signed)
[facility] Assessment Progress Note  Per Juanetta Beets, DO, this pt requires psychiatric hospitalization.  Malva Limes, RN, [name] has assigned pt to [facility] Rm [id number]; [facility] will be ready to receive pt between 12:30 and 13:00.  Pt presents under IVC initiated by EDP Linwood Dibbles, and IVC documents have been faxed to [facility].  Pt's nurse, Diane, has been notified, and agrees to call report to [phone].  Pt is to be transported via Patent examiner.   Doylene Canning, [place] Behavioral Health Coordinator [phone]

## 2018-05-21 NOTE — [facility] Group Notes (Signed)
[facility] Group Notes:  (Nursing/MHT/Case Management/Adjunct)  Date:  05/21/2018  Time:  4:00 PM  Type of Therapy:  Nurse Education  Participation Level:  Did Not Attend  Raylene Miyamoto 05/21/2018, 5:56 PM

## 2018-05-21 NOTE — [facility] Suicide Risk Assessment (Signed)
[facility] Admission Suicide Risk Assessment   Nursing information obtained from:  (P) Patient Demographic factors:  (P) Low socioeconomic status Current Mental Status:  (P) NA Loss Factors:  (P) Financial problems / change in socioeconomic status Historical Factors:  (P) Prior suicide attempts, Family history of mental illness or substance abuse Risk Reduction Factors:  (P) Responsible for children under 34 years of age, Living with another person, especially a relative, Positive coping skills or problem solving skills  Total Time spent with patient: 45 minutes Principal Problem:  Suicidal Ideations Diagnosis:  Active Problems:   MDD (major depressive disorder), single episode, moderate (HCC)  Subjective Data:   Continued Clinical Symptoms:  Alcohol Use Disorder Identification Test Final Score (AUDIT): 0 The "Alcohol Use Disorders Identification Test", Guidelines for Use in Primary Care, Second Edition.  World Science writer [facility]). Score between 0-7:  no or low risk or alcohol related problems. Score between 8-15:  moderate risk of alcohol related problems. Score between 16-19:  high risk of alcohol related problems. Score 20 or above:  warrants further diagnostic evaluation for alcohol dependence and treatment.   CLINICAL FACTORS:  33 year old female, lives with boyfriend and 3 children ages 53 years old and 2-1/2 months old (twins).  Presented to hospital following an argument with boyfriend during which she impulsively wrapped a cord around her neck.  Currently denies suicidal intent and states that it was simply a way of stressing to her boyfriend that she was upset.  At this time minimizes depression, denies suicidal ideations, and does not endorse significant neurovegetative symptoms or any suicidal thoughts leading up to event. No prior psychiatric history endorsed.   Psychiatric Specialty Exam: Physical Exam  ROS  Blood pressure 117/70, pulse 71, temperature 98.2 F (36.8 C),  temperature source Oral, resp. rate 18, height 5\' 9"  (1.753 m), weight 114.3 kg, last menstrual period 05/13/2018, not currently breastfeeding.Body mass index is 37.21 kg/m.  See admit note mental status exam   COGNITIVE FEATURES THAT CONTRIBUTE TO RISK:  Closed-mindedness and Loss of executive function    SUICIDE RISK:   Moderate:  Frequent suicidal ideation with limited intensity, and duration, some specificity in terms of plans, no associated intent, good self-control, limited dysphoria/symptomatology, some risk factors present, and identifiable protective factors, including available and accessible social support.  PLAN OF CARE: Patient will be admitted to inpatient psychiatric unit for stabilization and safety. Will provide and encourage milieu participation. Provide medication management and maked adjustments as needed.  Will follow daily.    I certify that inpatient services furnished can reasonably be expected to improve the patient's condition.   Craige Cotta, MD 05/21/2018, 4:29 PM

## 2018-05-21 NOTE — Progress Notes (Signed)
Ellen Martin is a 33 year old female pt admitted on involuntary basis. On admission, she does endorse that she put a cord around her neck but denies she was suicidal at the time and reports that she was having problems with her boyfriend, who has been cheating on her. She also denies SI currently and is able to contract for safety while in the hospital. She denies any substance abuse issues. She reports that she Is not on any medications currently. She reports that she has her own place and will return there upon discharge. Roda was cooperative during admission, was oriented to the milieu and safety maintained.
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
## 2018-05-21 NOTE — ED Notes (Signed)
Transported to [facility] by [employer]. All belongings returned to pt. Pt was upset, but cooperative.

## 2018-05-21 NOTE — H&P (Signed)
Psychiatric Admission Assessment Adult  Patient Identification: Ellen Martin MRN:  [id number] Date of Evaluation:  05/21/2018 Chief Complaint:   " I got into an argument with my babies' father"  Principal Diagnosis:  Suicidal Ideations Diagnosis:  Active Problems:   MDD (major depressive disorder), single episode, moderate (HCC)  History of Present Illness: 33 year old female. Lives with her BF and three children ( ages 12 year old and twins 2.5 months old). States she feels she had been doing "OK" recently, denies recent depression , denies recent suicidal ideations, denies significant neruo-vegetative symptoms on days leading to admission . States that on day of admission she got into a heated argument with her BF. States she felt acutely upset and put a cord around her neck during the argument . States " I was really upset, and I wanted him to see it was serious, because he sees everything as a joke". States she was trying to get her BF to understand how she felt and denies any actual plan or intention . States BF called 911 . States that " it was a bad day", and currently denies/minimizes severe or significant depression prior to above .  Of note, admission UDS positive for amphetamines , cannabis. States she had smoked cannabis on day prior to admission, and denies any known amphetamine use .  Associated Signs/Symptoms: Depression Symptoms:  Currently denies any neuro-vegetative symptoms and states sleep, energy level, appetite have all been within normal, and denies pervasive sense of sadness or anhedonia prior to admission. States she was not having any suicidal or self injurious ideations prior to yesterday. (Hypo) Manic Symptoms: none reported  Anxiety Symptoms: denies increased anxiety, denies panic attacks Psychotic Symptoms:  Denies  PTSD Symptoms: Denies  Total Time spent with patient: 45 minutes  Past Psychiatric History:  Denies history of prior psychiatric admissions. Denies past  history of suicide attempts . Denies history of self cutting. Denies history of psychosis. Denies history of severe depressive episodes and denies history of mania. Denies prior history of postpartum depression. Denies panic or agoraphobia. Does not endorse generalized anxiety Denies history of PTSD.  Denies history of violence.  Is the patient at risk to self? Yes.    Has the patient been a risk to self in the past 6 months? No.  Has the patient been a risk to self within the distant past? No.  Is the patient a risk to others? No.  Has the patient been a risk to others in the past 6 months? No.  Has the patient been a risk to others within the distant past? No.   Prior Inpatient Therapy:  denies  Prior Outpatient Therapy:  no outpatient psychiatric treatment  Alcohol Screening:   Substance Abuse History in the last 12 months:  Denies alcohol abuse, states she smokes cannabis occasionally. Denies other drug abuse.  Consequences of Substance Abuse: Denies  Previous Psychotropic Medications:reports she was treated with  Wellbutrin XL at age 64 " to help me quit smoking". Denies other psychiatric medication trials.  Psychological Evaluations:  No  Past Medical History: History of DM, was prescribed Metformin , but states has not been taking it x several weeks.  S/P partum ( 03/07/2018) . C section . States she is not breastfeeding .  Past Medical History:  Diagnosis Date  . Anxiety   . Blood transfusion without reported diagnosis   . Depression   . Diabetes mellitus without complication (HCC)    Type 2  . Hypertension  Past Surgical History:  Procedure Laterality Date  . CESAREAN SECTION MULTI-GESTATIONAL N/A 03/07/2018   Procedure: CESAREAN SECTION MULTI-GESTATIONAL;  Surgeon: Conan Bowens, MD;  Location: [facility] BIR[facility]ING SU[facility];  Service: Obstetrics;  Laterality: N/A;  . NO PAST SURGERIES     Family History: states she has no knowledge of her father, who has been in [place] for many  years, mother lives out of state in [name]. Has one brother.  Family History  Problem Relation Age of Onset  . Hypertension Mother   . Diabetes Mother   . Arthritis Mother   . Obesity Mother   . Hypertension Maternal Grandmother   . Diabetes Maternal Grandmother   . Arthritis Maternal Grandmother   . COPD Maternal Uncle   . Miscarriages / Stillbirths Other    Family Psychiatric  History: no known psychiatric illness in family, no suicides in family. Denies history of substance abuse in family . Tobacco Screening:  smokes 2-3 cigarettes per day.  Social History: 71, single, has three children ( 79 years old , and two infant twins- 27.75 month old) . Not currently employed . Lives with BF and her children.  Social History   Substance and Sexual Activity  Alcohol Use Not Currently     Social History   Substance and Sexual Activity  Drug Use Yes  . Types: Marijuana    Additional Social History:  Allergies:  No Known Allergies Lab Results:  Results for orders placed or performed during the hospital encounter of 05/20/18 (from the past 48 hour(s))  Comprehensive metabolic panel     Status: Abnormal   Collection Time: 05/20/18  3:13 PM  Result Value Ref Range   Sodium 137 135 - 145 mmol/L   Potassium 3.4 (L) 3.5 - 5.1 mmol/L   Chloride 102 98 - 111 mmol/L   CO2 26 22 - 32 mmol/L   Glucose, Bld 162 (H) 70 - 99 mg/dL   BUN 18 6 - 20 mg/dL   Creatinine, Ser 1.61 0.44 - 1.00 mg/dL   Calcium 9.1 8.9 - 09.6 mg/dL   Total Protein 7.7 6.5 - 8.1 g/dL   Albumin 4.0 3.5 - 5.0 g/dL   AST 20 15 - 41 U/L   ALT 15 0 - 44 U/L   Alkaline Phosphatase 92 38 - 126 U/L   Total Bilirubin 0.7 0.3 - 1.2 mg/dL   GFR calc non Af Amer >60 >60 mL/min   GFR calc Af Amer >60 >60 mL/min   Anion gap 9 5 - 15    Comment: Performed at [facility], [address], [place], [place] [address]  Ethanol     Status: None   Collection Time: 05/20/18  3:13 PM  Result Value Ref Range    Alcohol, Ethyl (B) <10 <10 mg/dL    Comment: (NOTE) Lowest detectable limit for serum alcohol is 10 mg/dL. For medical purposes only. Performed at [facility], [address], [place], [place] [address]   Salicylate level     Status: None   Collection Time: 05/20/18  3:13 PM  Result Value Ref Range   Salicylate Lvl <7.0 2.8 - 30.0 mg/dL    Comment: Performed at [facility], [address], [place], [place] [address]  Acetaminophen level     Status: Abnormal   Collection Time: 05/20/18  3:13 PM  Result Value Ref Range   Acetaminophen (Tylenol), Serum <10 (L) 10 - 30 ug/mL    Comment: (NOTE) Therapeutic concentrations  vary significantly. A range of 10-30 ug/mL  may be an effective concentration for many patients. However, some  are best treated at concentrations outside of this range. Acetaminophen concentrations >150 ug/mL at 4 hours after ingestion  and >50 ug/mL at 12 hours after ingestion are often associated with  toxic reactions. Performed at [facility], [address] [place], [place] [address]   cbc     Status: None   Collection Time: 05/20/18  3:13 PM  Result Value Ref Range   WBC 8.1 4.0 - 10.5 K/uL   RBC 4.35 3.87 - 5.11 MIL/uL   Hemoglobin 12.9 12.0 - 15.0 g/dL   HCT 19.1 47.8 - 29.5 %   MCV 89.4 80.0 - 100.0 fL   MCH 29.7 26.0 - 34.0 pg   MCHC 33.2 30.0 - 36.0 g/dL   RDW 62.1 30.8 - 65.7 %   Platelets 251 150 - 400 K/uL   nRBC 0.0 0.0 - 0.2 %    Comment: Performed at [facility], [address], [place], [place] [address]  Rapid urine drug screen (hospital performed)     Status: Abnormal   Collection Time: 05/20/18  3:27 PM  Result Value Ref Range   Opiates NONE DETECTED NONE DETECTED   Cocaine NONE DETECTED NONE DETECTED   Benzodiazepines NONE DETECTED NONE DETECTED   Amphetamines POSITIVE (A) NONE DETECTED   Tetrahydrocannabinol POSITIVE (A) NONE DETECTED   Barbiturates NONE  DETECTED NONE DETECTED    Comment: (NOTE) DRUG SCREEN FOR MEDICAL PURPOSES ONLY.  IF CONFIRMATION IS NEEDED FOR ANY PURPOSE, NOTIFY LAB WITHIN 5 DAYS. LOWEST DETECTABLE LIMITS FOR URINE DRUG SCREEN Drug Class                     Cutoff (ng/mL) Amphetamine and metabolites    1000 Barbiturate and metabolites    200 Benzodiazepine                 200 Tricyclics and metabolites     300 Opiates and metabolites        300 Cocaine and metabolites        300 THC                            50 Performed at [facility], [address], [place], [place] [address]   Pregnancy, urine     Status: None   Collection Time: 05/20/18  3:27 PM  Result Value Ref Range   Preg Test, Ur NEGATIVE NEGATIVE    Comment:        THE SENSITIVITY OF THIS METHODOLOGY IS >20 mIU/mL. Performed at [facility], [address] [place], [place] [address]     Blood Alcohol level:  Lab Results  Component Value Date   ETH <10 05/20/2018    Metabolic Disorder Labs:  No results found for: HGBA1C, MPG No results found for: PROLACTIN No results found for: CHOL, TRIG, HDL, CHOLHDL, VLDL, LDLCALC  Current Medications: Current Facility-Administered Medications  Medication Dose Route Frequency Provider Last Rate Last Dose  . acetaminophen (TYLENOL) tablet 650 mg  650 mg Oral Q6H PRN Laveda Abbe, NP      . alum & mag hydroxide-simeth (MAALOX/MYLANTA) 200-200-20 MG/5ML suspension 30 mL  30 mL Oral Q4H PRN Laveda Abbe, NP      . magnesium hydroxide (MILK OF MAGNESIA) suspension 30 mL  30 mL Oral Daily  PRN Laveda Abbe, NP       PTA Medications: Medications Prior to Admission  Medication Sig Dispense Refill Last Dose  . ibuprofen (ADVIL,MOTRIN) 800 MG tablet Take 1 tablet (800 mg total) by mouth 3 (three) times daily. (Patient not taking: Reported on 05/20/2018) 30 tablet 0 Not Taking at Unknown time  . metFORMIN (GLUCOPHAGE) 500 MG tablet Take 1  tablet (500 mg total) by mouth 2 (two) times daily with a meal. For diabetes (Patient not taking: Reported on 05/20/2018) 60 tablet 3 Not Taking at Unknown time  . senna-docusate (SENOKOT-S) 8.6-50 MG tablet Take 2 tablets by mouth daily. (Patient not taking: Reported on 03/24/2018) 10 tablet 0 Not Taking at Unknown time    Musculoskeletal: Strength & Muscle Tone: within normal limits Gait & Station: normal Patient leans: N/A  Psychiatric Specialty Exam: Physical Exam  Review of Systems  Constitutional: Negative.   HENT: Negative.   Eyes: Negative.   Respiratory: Negative.   Cardiovascular: Negative.   Gastrointestinal: Negative for abdominal pain, diarrhea, nausea and vomiting.  Genitourinary: Negative.   Musculoskeletal: Negative.   Skin: Negative.   Neurological: Negative for seizures and headaches.  Endo/Heme/Allergies: Negative.   Psychiatric/Behavioral: Positive for depression. The patient is nervous/anxious.   All other systems reviewed and are negative.   Blood pressure 117/70, pulse 71, temperature 98.2 F (36.8 C), temperature source Oral, resp. rate 18, height 5\' 9"  (1.753 m), weight 114.3 kg, last menstrual period 05/13/2018, not currently breastfeeding.Body mass index is 37.21 kg/m.  General Appearance: Fairly Groomed  Eye Contact:  Fair- but improved during session  Speech:  Normal Rate  Volume:  Normal  Mood:  currently minimizes depression, states " I am feeling OK"  Affect:  reactive, smiles briefly at times   Thought Process:  Linear and Descriptions of Associations: Intact  Orientation:  Other:  fully alert and attentive  Thought Content:  no hallucinations, no delusions   Suicidal Thoughts:  No denies suicidal ideations , denies self injurious ideations, denies homicidal ideations and specifically denies violent or homicidal ideations towards BF   Homicidal Thoughts:  No denies   Memory:  recent and remote grossly intact   Judgement:  Fair  Insight:  Fair   Psychomotor Activity:  Normal  Concentration:  Concentration: Good and Attention Span: Good  Recall:  Good  Fund of Knowledge:  Good  Language:  Good  Akathisia:  Negative  Handed:  Right  AIMS (if indicated):     Assets:  Desire for Improvement Resilience  ADL's:  Intact  Cognition:  WNL  Sleep:       Treatment Plan Summary: Daily contact with patient to assess and evaluate symptoms and progress in treatment, Medication management, Plan inpatient treatment  and medications as below  Observation Level/Precautions:  15 minute checks  Laboratory:  as needed  Check TSH, Hgb A1 C   Psychotherapy:  Milieu, group therapy   Medications:  Options discussed - States she is currently not interested in starting an antidepressant or other standing psychiatric medication , states " I don' think I need it", and as above, denies persistent or pervasive depressive symptoms.  Patient reports long term management with Metformin in the past, without side effects. States she has been off this medication x several weeks, because she had not returned for refill . Will restart Metformin at 500 mgrs BID and request Diabetic Care Coordinator consult for further recommendations  Consultations:  - as needed   Discharge Concerns:  -  Estimated LOS: 3-4 days   Other:     Physician Treatment Plan for Primary Diagnosis: Suicidal Attempt  Long Term Goal(s): Improvement in symptoms so as ready for discharge  Short Term Goals: Ability to identify changes in lifestyle to reduce recurrence of condition will improve, Ability to verbalize feelings will improve, Ability to disclose and discuss suicidal ideas, Ability to demonstrate self-control will improve and Ability to identify and develop effective coping behaviors will improve  Physician Treatment Plan for Secondary Diagnosis:  Adjustment Disorder versus MDD Long Term Goal(s): Improvement in symptoms so as ready for discharge  Short Term Goals: Ability to  identify changes in lifestyle to reduce recurrence of condition will improve, Ability to verbalize feelings will improve, Ability to disclose and discuss suicidal ideas, Ability to demonstrate self-control will improve, Ability to identify and develop effective coping behaviors will improve, Ability to maintain clinical measurements within normal limits will improve and Compliance with prescribed medications will improve  I certify that inpatient services furnished can reasonably be expected to improve the patient's condition.    Craige Cotta, MD 3/18/20203:46 PM

## 2018-05-21 NOTE — ED Notes (Signed)
Pt became tearful after being told that she is being admitted to [facility]. [employer] has been called for transport.

## 2018-05-21 NOTE — Tx Team (Signed)
Initial Treatment Plan 05/21/2018 4:02 PM [name] TDH:[id number]    PATIENT STRESSORS: Marital or family conflict   PATIENT STRENGTHS: Ability for insight Average or above average intelligence Capable of independent living General fund of knowledge   PATIENT IDENTIFIED PROBLEMS: Depression Suicidal thoughts "I was just being stupid"                     DISCHARGE CRITERIA:  Ability to meet basic life and health needs Improved stabilization in mood, thinking, and/or behavior Reduction of life-threatening or endangering symptoms to within safe limits Verbal commitment to aftercare and medication compliance  PRELIMINARY DISCHARGE PLAN: Attend aftercare/continuing care group Return to previous living arrangement  PATIENT/FAMILY INVOLVEMENT: This treatment plan has been presented to and reviewed with the patient, Ellen Martin, and/or family member, .  The patient and family have been given the opportunity to ask questions and make suggestions.  Connor Foxworthy, [place], [place] 05/21/2018, 4:02 PM

## 2018-05-22 DIAGNOSIS — Z915 Personal history of self-harm: Secondary | ICD-10-CM

## 2018-05-22 DIAGNOSIS — F321 Major depressive disorder, single episode, moderate: Principal | ICD-10-CM

## 2018-05-22 LAB — LIPID PANEL
CHOL/HDL RATIO: 3.7 ratio
Cholesterol: 176 mg/dL (ref 0–200)
HDL: 48 mg/dL (ref >40)
LDL Cholesterol: 96 mg/dL (ref 0–99)
Triglycerides: 161 mg/dL — ABNORMAL HIGH (ref <150)
VLDL: 32 mg/dL (ref 0–40)

## 2018-05-22 LAB — HEMOGLOBIN A1C
Hgb A1c MFr Bld: 6.2 % — ABNORMAL HIGH (ref 4.8–5.6)
Mean Plasma Glucose: 131.24 mg/dL

## 2018-05-22 LAB — TSH: TSH: 1.001 u[IU]/mL (ref 0.350–4.500)

## 2018-05-22 NOTE — Progress Notes (Signed)
D:  Ellen Martin was in her room much of the evening.  She did not attend evening wrap up group.  She did come up for her hs medications and requested anxiety and sleep medications.  She denied SI/HI or A/V hallucinations.  She denied any pain or discomfort and appeared to be in no physical distress.  She voiced no questions or issues at this time.   A:  1:1 with RN for support and encouragement.  Medications as ordered.  Q 15 minute checks maintained for safety.  Encouraged participation in group and unit activities.   R:  [name] remains safe on the unit.  We will continue to monitor the progress towards her goals.

## 2018-05-22 NOTE — Progress Notes (Signed)
[facility] MD Progress Note  05/22/2018 3:23 PM Ellen Martin  MRN:  [id number] Subjective: Patient states "I feel okay".  She is focused on discharging soon, looking forward to reuniting with her children.  She seems less focused on relationship conflict.  Denies suicidal ideations, currently minimizes depression.   Objective: I have discussed case with treatment team and have met with patient. 33 year old female, lives with boyfriend and 3 children ages 45 years old and 5-1/2 months old (twins).  Presented to hospital following an argument with boyfriend during which she impulsively wrapped a cord around her neck. Currently denies suicidal intent and states that it was simply a way of stressing to her boyfriend that she was upset.  At this time minimizes depression, denies suicidal ideations, and does not endorse significant neurovegetative symptoms or having had any suicidal thoughts leading up to event. No prior psychiatric history endorsed.  Today patient minimizes depression and continues to report that episode which led to admission was isolated and in the context of an argument with her boyfriend.  States she had no suicidal intent and that was an impulsive action as she felt boyfriend was not taking her seriously.  She denies any suicidal thoughts and identifies love for her children as a protective factor.  She is focused on discharging soon in order to reunite with her children.  Presents alert, calm, cooperative.  Briefly tearful during session which she attributes to being away from her children. Limited milieu participation at this time, spends most time in her room.  No agitated or disruptive behaviors on unit.  Patient is not currently on any standing psychiatric medications.  She has stated she does not feel she needs any and as above, denies having been significantly depressed prior to events that led to admission. Labs reviewed-CBG at this time is 93.  Hemoglobin A1c is 6.2.  Lipid panel  is unremarkable (mild hypertriglyceridemia), TSH normal at 1.001  Principal Problem: Depression, consider adjustment disorder, self-injurious behavior Diagnosis: Active Problems:   MDD (major depressive disorder), single episode, moderate (HCC)  Total Time spent with patient: 20 minutes  Past Psychiatric History:  Past Medical History:  Past Medical History:  Diagnosis Date  . Anxiety   . Blood transfusion without reported diagnosis   . Depression   . Diabetes mellitus without complication (HCC)    Type 2  . Hypertension     Past Surgical History:  Procedure Laterality Date  . CESAREAN SECTION MULTI-GESTATIONAL N/A 03/07/2018   Procedure: CESAREAN SECTION MULTI-GESTATIONAL;  Surgeon: Sloan Leiter, MD;  Location: [facility];  Service: Obstetrics;  Laterality: N/A;  . NO PAST SURGERIES     Family History:  Family History  Problem Relation Age of Onset  . Hypertension Mother   . Diabetes Mother   . Arthritis Mother   . Obesity Mother   . Hypertension Maternal Grandmother   . Diabetes Maternal Grandmother   . Arthritis Maternal Grandmother   . COPD Maternal Uncle   . Miscarriages / [place] Other    Family Psychiatric  History:  Social History:  Social History   Substance and Sexual Activity  Alcohol Use Not Currently     Social History   Substance and Sexual Activity  Drug Use Yes  . Types: Marijuana    Social History   Socioeconomic History  . Marital status: Single    Spouse name: Not on file  . Number of children: Not on file  . Years of education: Not on file  .  Highest education level: Not on file  Occupational History  . Occupation: unemployed  Social Needs  . Financial resource strain: Not hard at all  . Food insecurity:    Worry: Never true    Inability: Never true  . Transportation needs:    Medical: Yes    Non-medical: Not on file  Tobacco Use  . Smoking status: Current Every Day Smoker    Packs/day: 0.50  . Smokeless tobacco:  Never Used  Substance and Sexual Activity  . Alcohol use: Not Currently  . Drug use: Yes    Types: Marijuana  . Sexual activity: Yes  Lifestyle  . Physical activity:    Days per week: Not on file    Minutes per session: Not on file  . Stress: To some extent  Relationships  . Social connections:    Talks on phone: Not on file    Gets together: Not on file    Attends religious service: Not on file    Active member of club or organization: Not on file    Attends meetings of clubs or organizations: Not on file    Relationship status: Not on file  Other Topics Concern  . Not on file  Social History Narrative  . Not on file    Sleep: Good  Appetite:  Good  Current Medications: Current Facility-Administered Medications  Medication Dose Route Frequency Provider Last Rate Last Dose  . acetaminophen (TYLENOL) tablet 650 mg  650 mg Oral Q6H PRN Ethelene Hal, NP      . alum & mag hydroxide-simeth (MAALOX/MYLANTA) 200-200-20 MG/5ML suspension 30 mL  30 mL Oral Q4H PRN Ethelene Hal, NP      . hydrOXYzine (ATARAX/VISTARIL) tablet 25 mg  25 mg Oral Q6H PRN Cobos, Myer Peer, MD   25 mg at 05/21/18 2135  . magnesium hydroxide (MILK OF MAGNESIA) suspension 30 mL  30 mL Oral Daily PRN Ethelene Hal, NP      . metFORMIN (GLUCOPHAGE) tablet 500 mg  500 mg Oral BID WC Cobos, Myer Peer, MD   500 mg at 05/22/18 0837  . pneumococcal 23 valent vaccine (PNU-IMMUNE) injection 0.5 mL  0.5 mL Intramuscular Tomorrow-1000 Cobos, Myer Peer, MD      . traZODone (DESYREL) tablet 50 mg  50 mg Oral QHS PRN Cobos, Myer Peer, MD   50 mg at 05/21/18 2135    Lab Results:  Results for orders placed or performed during the hospital encounter of 05/21/18 (from the past 48 hour(s))  Hemoglobin A1c     Status: Abnormal   Collection Time: 05/22/18  6:46 AM  Result Value Ref Range   Hgb A1c MFr Bld 6.2 (H) 4.8 - 5.6 %    Comment: (NOTE) Pre diabetes:          5.7%-6.4% Diabetes:               >6.4% Glycemic control for   <7.0% adults with diabetes    Mean Plasma Glucose 131.24 mg/dL    Comment: Performed at [facility], [place] [address] [place], [place] [address]  Lipid panel     Status: Abnormal   Collection Time: 05/22/18  6:46 AM  Result Value Ref Range   Cholesterol 176 0 - 200 mg/dL   Triglycerides 161 (H) <150 mg/dL   HDL 48 >40 mg/dL   Total CHOL/HDL Ratio 3.7 RATIO   VLDL 32 0 - 40 mg/dL   LDL Cholesterol 96 0 - 99 mg/dL  Comment:        Total Cholesterol/HDL:CHD Risk Coronary Heart Disease Risk Table                     Men   Women  1/2 Average Risk   3.4   3.3  Average Risk       5.0   4.4  2 X Average Risk   9.6   7.1  3 X Average Risk  23.4   11.0        Use the calculated Patient Ratio above and the CHD Risk Table to determine the patient's CHD Risk.        ATP III CLASSIFICATION (LDL):  <100     mg/dL   Optimal  100-129  mg/dL   Near or Above                    Optimal  130-159  mg/dL   Borderline  160-189  mg/dL   High  >190     mg/dL   Very High Performed at [facility] [address], [place], [place] [address]   TSH     Status: None   Collection Time: 05/22/18  6:46 AM  Result Value Ref Range   TSH 1.001 0.350 - 4.500 uIU/mL    Comment: Performed by a 3rd Generation assay with a functional sensitivity of <=0.01 uIU/mL. Performed at [facility], [place] [address], [place], [place] [address]     Blood Alcohol level:  Lab Results  Component Value Date   ETH <10 40/97/3532    Metabolic Disorder Labs: Lab Results  Component Value Date   HGBA1C 6.2 (H) 05/22/2018   MPG 131.24 05/22/2018   No results found for: PROLACTIN Lab Results  Component Value Date   CHOL 176 05/22/2018   TRIG 161 (H) 05/22/2018   HDL 48 05/22/2018   CHOLHDL 3.7 05/22/2018   VLDL 32 05/22/2018   LDLCALC 96 05/22/2018    Physical Findings: AIMS: Facial and Oral Movements Muscles of Facial Expression:  None, normal Lips and Perioral Area: None, normal Jaw: None, normal Tongue: None, normal,Extremity Movements Upper (arms, wrists, hands, fingers): None, normal Lower (legs, knees, ankles, toes): None, normal, Trunk Movements Neck, shoulders, hips: None, normal, Overall Severity Severity of abnormal movements (highest score from questions above): None, normal Incapacitation due to abnormal movements: None, normal Patient's awareness of abnormal movements (rate only patient's report): No Awareness, Dental Status Current problems with teeth and/or dentures?: No Does patient usually wear dentures?: No  CIWA:    COWS:     Musculoskeletal: Strength & Muscle Tone: within normal limits Gait & Station: normal Patient leans: N/A  Psychiatric Specialty Exam: Physical Exam  ROS denies chest pain, no shortness of breath, vomited x1 earlier today, no fever, no chills  Blood pressure 117/70, pulse 71, temperature 98.2 F (36.8 C), temperature source Oral, resp. rate 18, height _0  (1.753 m), weight 114.3 kg, last menstrual period 05/13/2018, not currently breastfeeding.Body mass index is 37.21 kg/m.  General Appearance: Fairly Groomed  Eye Contact:  Fair-improves during session  Speech:  Normal Rate  Volume:  Normal  Mood:  Minimizes depression at this time, states she feels "all right" and is hopeful for discharge soon  Affect:  Vaguely constricted, briefly tearful when stating she is away from her children  Thought Process:  Linear and Descriptions of Associations: Intact  Orientation:  Other:  Fully alert and attentive  Thought  Content:  Denies hallucinations, no delusions are expressed, does not appear internally preoccupied  Suicidal Thoughts:  No denies suicidal or self-injurious ideations, contracts for safety on unit, denies homicidal or violent ideations, specifically also denies any homicidal ideations towards her significant other.  Homicidal Thoughts:  No  Memory:  Recent and  remote grossly intact  Judgement:  Fair  Insight:  Fair  Psychomotor Activity:  Decreased  Concentration:  Concentration: Good and Attention Span: Good  Recall:  Good  Fund of Knowledge:  Good  Language:  Good  Akathisia:  Negative  Handed:  Right  AIMS (if indicated):     Assets:  Desire for Improvement Resilience  ADL's:  Intact  Cognition:  WNL  Sleep:  Number of Hours: 6.75   Assessment: 33 year old female, lives with boyfriend and 3 children ages 6 years old and 51-1/2 months old (twins).  Presented to hospital following an argument with boyfriend during which she impulsively wrapped a cord around her neck. Currently denies suicidal intent and states that it was simply a way of stressing to her boyfriend that she was upset.  At this time minimizes depression, denies suicidal ideations, and does not endorse significant neurovegetative symptoms or having had any suicidal thoughts leading up to event. No prior psychiatric history endorsed.  Today patient minimizes depression, reiterates that episode that led to admission was isolated and that she was not feeling depressed or suicidal leading up to event, which she states occurred in the context of an argument with her boyfriend.  She is focused on discharging soon in order to reunite with her children. Her affect does present vaguely constricted, anxious, although tends to improve during session.  She denies any suicidal ideations at this time.  She is not currently on any standing psychiatric medications and states she does not feel she needs to be on any at this time.  Of note, we discussed possibility of a family meeting in order to review support network and relationship issues .  She declined, stating her boyfriend would most likely not come.   Treatment Plan Summary: Daily contact with patient to assess and evaluate symptoms and progress in treatment, Medication management, Plan Inpatient treatment and Medications as below Encourage  group and milieu participation to work on coping skills and symptom reduction.  Treatment team working on disposition planning options.  Continue Glucophage 500 mg twice daily for diabetes Continue Vistaril 25 mg every 6 hours PRN for anxiety Continue Trazodone 50 mg nightly PRN for insomnia  Jenne Campus, MD 05/22/2018, 3:23 PM

## 2018-05-22 NOTE — Progress Notes (Addendum)
Inpatient Diabetes Program Recommendations  AACE/ADA: New Consensus Statement on Inpatient Glycemic Control (2015)  Target Ranges:  Prepandial:   less than 140 mg/dL      Peak postprandial:   less than 180 mg/dL (1-2 hours)      Critically ill patients:  140 - 180 mg/dL   Lab Results  Component Value Date   GLUCAP 93 03/09/2018   HGBA1C 6.2 (H) 05/22/2018    Review of Glycemic Control  Diabetes history: Prediabetic Pt. Had gestational DM Outpatient Diabetes medications: Metformin 500 mg bid Current orders for Inpatient glycemic control: Metformin 500 mg bid  Inpatient Diabetes Program Recommendations:    Received DM consult. -Glycemic control order set including sensitive correction tid  -Carb modified diet  Thank you, Billy Fischer. Kayal Mula, RN, MSN, CDE  Diabetes Coordinator Inpatient Glycemic Control Team Team Pager [phone] (8am-5pm) 05/22/2018 10:23 AM

## 2018-05-22 NOTE — BHH Counselor (Signed)
CSW attempted to complete PSA with the patient twice today. Patient has remained asleep in her room and does not respond to her name being called.   CSW will continue to follow and attempt at a later time.   Baldo Daub, MSW, LCSWA Clinical Social Worker [facility]  Phone: [phone]

## 2018-05-22 NOTE — Plan of Care (Signed)
  Problem: Activity: Goal: Interest or engagement in activities will improve Outcome: Progressing   Problem: Coping: Goal: Ability to verbalize frustrations and anger appropriately will improve Outcome: Progressing   D: Pt alert and oriented on the unit. Pt engaging with RN staff and other pts. Pt denies SI/HI, A/VH. Pt attended and participated during unit groups and activities, and is pleasant and cooperative. A: Education, support and encouragement provided, q15 minute safety checks remain in effect. Medications administered per MD orders. R: No reactions/side effects to medicine noted. Pt denies any concerns at this time, and verbally contracts for safety. Pt ambulating on the unit with no issues. Pt remains safe on and off the unit.

## 2018-05-22 NOTE — Progress Notes (Signed)
D: Pt was in dayroom upon initial approach.  She presents with appropriate affect and depressed mood.  She reports her day was "all right."  Her goal is to "just be more social."  Pt reports she has been more social today.  Denies SI/HI, hallucinations, and pain.  Pt has been visible in milieu interacting with peers and staff appropriately.  She attended evening group.  A: Introduced self to pt.  Actively listened to pt and provided support and encouragement.  PRN medication administered for sleep.  Q15 minute safety checks maintained.  R: Pt is compliant with medication.  She verbally contracts for safety and reports she will inform staff of needs and concerns.  Will continue to monitor and assess.

## 2018-05-23 DIAGNOSIS — R45851 Suicidal ideations: Secondary | ICD-10-CM

## 2018-05-23 MED ORDER — TRAZODONE HCL 50 MG PO TABS: 50.0000 mg | ORAL_TABLET | Freq: Every evening | ORAL | 0 refills | Status: DC | PRN

## 2018-05-23 MED ORDER — HYDROXYZINE HCL 25 MG PO TABS: 25.0000 mg | ORAL_TABLET | Freq: Four times a day (QID) | ORAL | 0 refills | Status: DC | PRN

## 2018-05-23 NOTE — BHH Suicide Risk Assessment (Signed)
B[facility] INPATIENT:  Family/Significant Other Suicide Prevention Education  Suicide Prevention Education:  Contact Attempts: significant other, Ellen Martin [phone]) has been identified by the patient as the family member/significant other with whom the patient will be residing, and identified as the person(s) who will aid the patient in the event of a mental health crisis.  With written consent from the patient, two attempts were made to provide suicide prevention education, prior to and/or following the patient's discharge.  We were unsuccessful in providing suicide prevention education.  A suicide education pamphlet was given to the patient to share with family/significant other.  Date and time of first attempt: 05/23/2018 at 9:03am with [employer].  Ellen Martin speaks Spanish, the call went straight to VM and CSW was unable to leave a message.   Darreld Mclean 05/23/2018, 9:05 AM

## 2018-05-23 NOTE — Progress Notes (Signed)
Recreation Therapy Notes  Date:  3.20.20 Time: 0930 Location: [address] Dayroom  Group Topic: Stress Management  Goal Area(s) Addresses:  Patient will identify positive stress management techniques. Patient will identify benefits of using stress management post d/c.  Intervention:  Stress Management   Activity :  Meditation.  LRT introduced patients to the stress management technique of meditation.  LRT played Martin meditation that focused on making the most of your day and the possibilities that can be had.  Patients were to follow along as meditation played to engage in activity.   Education:  Stress Management, Discharge Planning.   Education Outcome: Acknowledges Education  Clinical Observations/Feedback: Pt did not attend group.      Ellen Martin, LRT/CTRS         Ellen Martin 05/23/2018 11:06 AM 

## 2018-05-23 NOTE — Progress Notes (Signed)
  [facility] Adult Case Management Discharge Plan :  Will you be returning to the same living situation after discharge:  Yes,  home At discharge, do you have transportation home?: Yes,  partner picking up Do you have the ability to pay for your medications: Yes,  Medicaid  Release of information consent forms completed and in the chart.  Patient to Follow up at: Follow-up Information    [facility] Follow up on 05/26/2018.   Why:  Your hospital follow up appointment is Moonday, 3/23 at 8:00a.  The appointment will be conducted over the phone, please be sure to answer.  Contact information: [address] [place] [address] [phone]           Next level of care provider has access to [facility] Link:no  Safety Planning and Suicide Prevention discussed: Yes,  reviewed with patient. Attempted to contact partner, [place].  Have you used any form of tobacco in the last 30 days? (Cigarettes, Smokeless Tobacco, Cigars, and/or Pipes): Yes  Has patient been referred to the Quitline?: Patient refused referral  Patient has been referred for addiction treatment: Yes  Darreld Mclean, LCSWA 05/23/2018, 10:09 AM

## 2018-05-23 NOTE — B[facility] Suicide Risk Assessment (Signed)
[facility] Discharge Suicide Risk Assessment   Principal Problem: Seidel ideation Discharge Diagnoses: Active Problems:   MDD (major depressive disorder), single episode, moderate (HCC)   Total Time spent with patient: 30 minutes  Musculoskeletal: Strength & Muscle Tone: within normal limits Gait & Station: normal Patient leans: N/A  Psychiatric Specialty Exam: ROS-denies headache, no chest pain, no shortness of breath, no vomiting, no fever, no chills  Blood pressure 111/71, pulse 97, temperature (!) 97.5 F (36.4 C), temperature source Oral, resp. rate 16, height 5\' 9"  (1.753 m), weight 114.3 kg, last menstrual period 05/13/2018, not currently breastfeeding.Body mass index is 37.21 kg/m.  General Appearance: Fairly Groomed  Patent attorney::  Good  Speech:  Normal Rate409  Volume:  Normal  Mood:  Ports feeling better, currently minimizes depression, states "I feel a lot better"  Affect:  Appropriate, reactive, smiles during session appropriately  Thought Process:  Linear and Descriptions of Associations: Intact  Orientation:  Full (Time, Place, and Person)  Thought Content:  No hallucinations, no delusions  Suicidal Thoughts:  No currently denies any suicidal or self-injurious ideations, denies any homicidal or violent ideations  Homicidal Thoughts:  No  Memory:  Recent and remote grossly intact  Judgement:  Other:  Improving  Insight:  Fair/improving  Psychomotor Activity:  Normal  Concentration:  Good  Recall:  Good  Fund of Knowledge:Good  Language: Good  Akathisia:  Negative  Handed:  Right  AIMS (if indicated):     Assets:  Communication Skills Desire for Improvement Resilience  Sleep:  Number of Hours: 6.75  Cognition: WNL  ADL's:  Intact   Mental Status Per Nursing Assessment::   On Admission:  NA  Demographic Factors:  33 year old female, lives with significant other, has 3 children (a 62-year-old and infant twins)  Loss Factors: Relationship stressors, reports  event was triggered by an argument with her boyfriend  Historical Factors: No history of prior psychiatric admissions, no history of prior suicide attempts, no history of self cutting.  Denies prior history of severe depression.  Risk Reduction Factors:   Responsible for children under 66 years of age, Sense of responsibility to family, Living with another person, especially a relative and Positive coping skills or problem solving skills  Continued Clinical Symptoms:  At this time patient is alert, attentive, well related, calm, denies depression and mood presents improved and currently euthymic.  Affect is appropriate/reactive.  No thought disorder, no suicidal or homicidal ideations.  Has also specifically denied any violent or homicidal ideations towards her boyfriend or towards her children.  No hallucinations, no delusions, future oriented.  Reports that relationship with boyfriend much improved and that they have worked out their differences, states she feels the relationship is strong. With her expressed consent and in her presence I spoke with her boyfriend [facility]) who corroborates patient seems much improved, back to baseline. No disruptive or agitated behaviors on unit.  Pleasant on approach. Currently not on any standing psychiatric medication management.   Cognitive Features That Contribute To Risk:  No gross cognitive deficits noted upon discharge. Is alert , attentive, and oriented x 3   Suicide Risk:  Mild:  Suicidal ideation of limited frequency, intensity, duration, and specificity.  There are no identifiable plans, no associated intent, mild dysphoria and related symptoms, good self-control (both objective and subjective assessment), few other risk factors, and identifiable protective factors, including available and accessible social support.  Follow-up Information    [facility] Follow up on 05/26/2018.   Why:  Your  hospital follow up appointment is [place], 3/23 at 8:00a.  The  appointment will be conducted over the phone, please be sure to answer.  Contact information: [address] [place] [address] [phone]           Plan Of Care/Follow-up recommendations:  Activity:  As tolerated Diet:  Regular Tests:  NA Other:  See below  Patient is requesting discharge and there are no current grounds for any involuntary commitment at this time.  She is leaving unit in good spirits.  Plans to return home.  Follow-up as above. Follow-up with PCP for medical monitoring and treatment as needed. Craige Cotta, MD 05/23/2018, 10:22 AM

## 2018-05-23 NOTE — BHH Counselor (Signed)
Patient is discharging within 72 hours of admission. Patient was admitted 05/21/2018 and is discharging, 05/23/2018. CSW will continue to follow for possible referrals and discharge planning for continuity of care. Patient will discharge, 05/23/2018.   Baldo Daub, MSW, LCSWA Clinical Social Worker [facility]  Phone: [phone]
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
## 2018-05-23 NOTE — Progress Notes (Signed)
Discharge note: Patient reviewed discharge paperwork with RN including prescriptions, follow up appointments, and lab work. Patient given the opportunity to ask questions. All concerns were addressed. All belongings were returned to patient. Denied SI/HI/AVH. Patient thanked staff for their care while at the hospital.  Patient was picked up by her husband in the lobby.

## 2018-05-23 NOTE — Plan of Care (Signed)
  Problem: Education: Goal: Knowledge of [facility] Education information/materials will improve Outcome: Adequate for Discharge   Problem: Education: Goal: Emotional status will improve 05/23/2018 0852 by Dewayne Shorter, RN Outcome: Adequate for Discharge 05/23/2018 0852 by Dewayne Shorter, RN Outcome: Progressing   Problem: Education: Goal: Mental status will improve 05/23/2018 0852 by Dewayne Shorter, RN Outcome: Adequate for Discharge 05/23/2018 0852 by Dewayne Shorter, RN Outcome: Progressing   Problem: Education: Goal: Verbalization of understanding the information provided will improve 05/23/2018 0852 by Dewayne Shorter, RN Outcome: Adequate for Discharge 05/23/2018 0852 by Dewayne Shorter, RN Outcome: Progressing

## 2018-05-23 NOTE — Tx Team (Signed)
Interdisciplinary Treatment and Diagnostic Plan Update  05/23/2018 Time of Session:  Ellen Martin MRN: [id number]  Principal Diagnosis: <principal problem not specified>  Secondary Diagnoses: Active Problems:   MDD (major depressive disorder), single episode, moderate (HCC)   Current Medications:  Current Facility-Administered Medications  Medication Dose Route Frequency Provider Last Rate Last Dose  . acetaminophen (TYLENOL) tablet 650 mg  650 mg Oral Q6H PRN Laveda Abbe, NP      . alum & mag hydroxide-simeth (MAALOX/MYLANTA) 200-200-20 MG/5ML suspension 30 mL  30 mL Oral Q4H PRN Laveda Abbe, NP      . hydrOXYzine (ATARAX/VISTARIL) tablet 25 mg  25 mg Oral Q6H PRN Cobos, Rockey Situ, MD   25 mg at 05/22/18 1832  . magnesium hydroxide (MILK OF MAGNESIA) suspension 30 mL  30 mL Oral Daily PRN Laveda Abbe, NP      . metFORMIN (GLUCOPHAGE) tablet 500 mg  500 mg Oral BID WC Cobos, Rockey Situ, MD   500 mg at 05/23/18 0802  . pneumococcal 23 valent vaccine (PNU-IMMUNE) injection 0.5 mL  0.5 mL Intramuscular Tomorrow-1000 Cobos, Fernando A, MD      . traZODone (DESYREL) tablet 50 mg  50 mg Oral QHS PRN Cobos, Rockey Situ, MD   50 mg at 05/22/18 2100   PTA Medications: Medications Prior to Admission  Medication Sig Dispense Refill Last Dose  . ibuprofen (ADVIL,MOTRIN) 800 MG tablet Take 1 tablet (800 mg total) by mouth 3 (three) times daily. (Patient not taking: Reported on 05/20/2018) 30 tablet 0 Not Taking at Unknown time  . metFORMIN (GLUCOPHAGE) 500 MG tablet Take 1 tablet (500 mg total) by mouth 2 (two) times daily with a meal. For diabetes (Patient not taking: Reported on 05/20/2018) 60 tablet 3 Not Taking at Unknown time  . senna-docusate (SENOKOT-S) 8.6-50 MG tablet Take 2 tablets by mouth daily. (Patient not taking: Reported on 03/24/2018) 10 tablet 0 Not Taking at Unknown time    Patient Stressors: Marital or family conflict  Patient Strengths: Ability for  insight Average or above average intelligence Capable of independent living General fund of knowledge  Treatment Modalities: Medication Management, Group therapy, Case management,  1 to 1 session with clinician, Psychoeducation, Recreational therapy.   Physician Treatment Plan for Primary Diagnosis: <principal problem not specified> Long Term Goal(s): Improvement in symptoms so as ready for discharge Improvement in symptoms so as ready for discharge   Short Term Goals: Ability to identify changes in lifestyle to reduce recurrence of condition will improve Ability to verbalize feelings will improve Ability to disclose and discuss suicidal ideas Ability to demonstrate self-control will improve Ability to identify and develop effective coping behaviors will improve Ability to identify changes in lifestyle to reduce recurrence of condition will improve Ability to verbalize feelings will improve Ability to disclose and discuss suicidal ideas Ability to demonstrate self-control will improve Ability to identify and develop effective coping behaviors will improve Ability to maintain clinical measurements within normal limits will improve Compliance with prescribed medications will improve  Medication Management: Evaluate patient's response, side effects, and tolerance of medication regimen.  Therapeutic Interventions: 1 to 1 sessions, Unit Group sessions and Medication administration.  Evaluation of Outcomes: Adequate for Discharge  Physician Treatment Plan for Secondary Diagnosis: Active Problems:   MDD (major depressive disorder), single episode, moderate (HCC)  Long Term Goal(s): Improvement in symptoms so as ready for discharge Improvement in symptoms so as ready for discharge   Short Term Goals: Ability to identify changes  in lifestyle to reduce recurrence of condition will improve Ability to verbalize feelings will improve Ability to disclose and discuss suicidal ideas Ability  to demonstrate self-control will improve Ability to identify and develop effective coping behaviors will improve Ability to identify changes in lifestyle to reduce recurrence of condition will improve Ability to verbalize feelings will improve Ability to disclose and discuss suicidal ideas Ability to demonstrate self-control will improve Ability to identify and develop effective coping behaviors will improve Ability to maintain clinical measurements within normal limits will improve Compliance with prescribed medications will improve     Medication Management: Evaluate patient's response, side effects, and tolerance of medication regimen.  Therapeutic Interventions: 1 to 1 sessions, Unit Group sessions and Medication administration.  Evaluation of Outcomes: Adequate for Discharge   RN Treatment Plan for Primary Diagnosis: <principal problem not specified> Long Term Goal(s): Knowledge of disease and therapeutic regimen to maintain health will improve  Short Term Goals: Ability to participate in decision making will improve, Ability to verbalize feelings will improve, Ability to disclose and discuss suicidal ideas, Ability to identify and develop effective coping behaviors will improve and Compliance with prescribed medications will improve  Medication Management: RN will administer medications as ordered by provider, will assess and evaluate patient's response and provide education to patient for prescribed medication. RN will report any adverse and/or side effects to prescribing provider.  Therapeutic Interventions: 1 on 1 counseling sessions, Psychoeducation, Medication administration, Evaluate responses to treatment, Monitor vital signs and CBGs as ordered, Perform/monitor CIWA, COWS, AIMS and Fall Risk screenings as ordered, Perform wound care treatments as ordered.  Evaluation of Outcomes: Adequate for Discharge   LCSW Treatment Plan for Primary Diagnosis: <principal problem not  specified> Long Term Goal(s): Safe transition to appropriate next level of care at discharge, Engage patient in therapeutic group addressing interpersonal concerns.  Short Term Goals: Engage patient in aftercare planning with referrals and resources  Therapeutic Interventions: Assess for all discharge needs, 1 to 1 time with Social worker, Explore available resources and support systems, Assess for adequacy in community support network, Educate family and significant other(s) on suicide prevention, Complete Psychosocial Assessment, Interpersonal group therapy.  Evaluation of Outcomes: Adequate for Discharge   Progress in Treatment: Attending groups: No. Participating in groups: No. Taking medication as prescribed: Yes. Toleration medication: Yes. Family/Significant other contact made: No, will contact:  if patient consents to collateral contacts Patient understands diagnosis: Yes. Discussing patient identified problems/goals with staff: Yes. Medical problems stabilized or resolved: Yes. Denies suicidal/homicidal ideation: Yes. Issues/concerns per patient self-inventory: No. Other:   New problem(s) identified:  New Short Term/Long Term Goal(s):medication stabilization, elimination of SI thoughts, development of comprehensive mental wellness plan.    Patient Goals:  I was just being stupid  Discharge Plan or Barriers: CSW will continue to assess for appropriate referrals and possible discharge planning.   Reason for Continuation of Hospitalization: None   Estimated Length of Stay: Discharging, 05/23/2018  Attendees: Patient: 05/23/2018 8:44 AM  Physician: Dr. Nehemiah Massed, MD 05/23/2018 8:44 AM  Nursing: Arlyss Repress.Shela Commons RN 05/23/2018 8:44 AM  RN Care Manager: 05/23/2018 8:44 AM  Social Worker: Baldo Daub, LCSWA 05/23/2018 8:44 AM  Recreational Therapist:  05/23/2018 8:44 AM  Other: Marciano Sequin, NP 05/23/2018 8:44 AM  Other:  05/23/2018 8:44 AM  Other: 05/23/2018 8:44 AM    Scribe  for Treatment Team: Maeola Sarah, LCSWA 05/23/2018 8:44 AM

## 2018-09-15 DIAGNOSIS — Z3009 Encounter for other general counseling and advice on contraception: Secondary | ICD-10-CM | POA: Diagnosis not present

## 2018-09-15 DIAGNOSIS — Z3200 Encounter for pregnancy test, result unknown: Secondary | ICD-10-CM | POA: Diagnosis not present

## 2018-10-23 DIAGNOSIS — Z3201 Encounter for pregnancy test, result positive: Secondary | ICD-10-CM | POA: Diagnosis not present

## 2018-10-23 DIAGNOSIS — N911 Secondary amenorrhea: Secondary | ICD-10-CM | POA: Diagnosis not present

## 2018-10-23 DIAGNOSIS — Z8632 Personal history of gestational diabetes: Secondary | ICD-10-CM | POA: Diagnosis not present

## 2018-10-28 DIAGNOSIS — O3680X0 Pregnancy with inconclusive fetal viability, not applicable or unspecified: Secondary | ICD-10-CM | POA: Diagnosis not present

## 2018-10-28 DIAGNOSIS — Z113 Encounter for screening for infections with a predominantly sexual mode of transmission: Secondary | ICD-10-CM | POA: Diagnosis not present

## 2018-10-28 DIAGNOSIS — O10012 Pre-existing essential hypertension complicating pregnancy, second trimester: Secondary | ICD-10-CM | POA: Diagnosis not present

## 2018-10-28 DIAGNOSIS — Z3A14 14 weeks gestation of pregnancy: Secondary | ICD-10-CM | POA: Diagnosis not present

## 2018-10-28 DIAGNOSIS — O24912 Unspecified diabetes mellitus in pregnancy, second trimester: Secondary | ICD-10-CM | POA: Diagnosis not present

## 2018-10-28 DIAGNOSIS — Z3689 Encounter for other specified antenatal screening: Secondary | ICD-10-CM | POA: Diagnosis not present

## 2018-10-28 DIAGNOSIS — O99212 Obesity complicating pregnancy, second trimester: Secondary | ICD-10-CM | POA: Diagnosis not present

## 2018-10-28 LAB — OB RESULTS CONSOLE VARICELLA ZOSTER ANTIBODY, IGG: Varicella: IMMUNE

## 2018-10-28 LAB — OB RESULTS CONSOLE GC/CHLAMYDIA
Chlamydia: POSITIVE
Gonorrhea: NEGATIVE

## 2018-10-28 LAB — OB RESULTS CONSOLE HEPATITIS B SURFACE ANTIGEN: Hepatitis B Surface Ag: NEGATIVE

## 2018-10-28 LAB — OB RESULTS CONSOLE RPR: RPR: NONREACTIVE

## 2018-10-28 LAB — OB RESULTS CONSOLE PLATELET COUNT: Platelets: 242

## 2018-10-28 LAB — OB RESULTS CONSOLE RUBELLA ANTIBODY, IGM: Rubella: IMMUNE

## 2018-10-28 LAB — OB RESULTS CONSOLE HGB/HCT, BLOOD
HCT: 39 (ref 29–41)
Hemoglobin: 13.7

## 2018-10-28 LAB — OB RESULTS CONSOLE HIV ANTIBODY (ROUTINE TESTING): HIV: NONREACTIVE

## 2018-10-29 DIAGNOSIS — Z113 Encounter for screening for infections with a predominantly sexual mode of transmission: Secondary | ICD-10-CM | POA: Diagnosis not present

## 2018-11-06 ENCOUNTER — Ambulatory Visit: Payer: Medicaid Other | Admitting: Registered"

## 2018-11-27 DIAGNOSIS — O2393 Unspecified genitourinary tract infection in pregnancy, third trimester: Secondary | ICD-10-CM | POA: Diagnosis not present

## 2018-11-27 DIAGNOSIS — Z363 Encounter for antenatal screening for malformations: Secondary | ICD-10-CM | POA: Diagnosis not present

## 2018-11-27 DIAGNOSIS — O24913 Unspecified diabetes mellitus in pregnancy, third trimester: Secondary | ICD-10-CM | POA: Diagnosis not present

## 2018-11-27 DIAGNOSIS — O99212 Obesity complicating pregnancy, second trimester: Secondary | ICD-10-CM | POA: Diagnosis not present

## 2018-11-27 DIAGNOSIS — O10013 Pre-existing essential hypertension complicating pregnancy, third trimester: Secondary | ICD-10-CM | POA: Diagnosis not present

## 2018-11-27 DIAGNOSIS — Z3A18 18 weeks gestation of pregnancy: Secondary | ICD-10-CM | POA: Diagnosis not present

## 2018-12-03 ENCOUNTER — Ambulatory Visit: Payer: Medicaid Other | Admitting: Registered"

## 2019-01-29 ENCOUNTER — Encounter (HOSPITAL_COMMUNITY): Payer: Self-pay | Admitting: *Deleted

## 2019-01-29 ENCOUNTER — Telehealth: Payer: Self-pay | Admitting: Obstetrics and Gynecology

## 2019-01-29 ENCOUNTER — Other Ambulatory Visit: Payer: Self-pay

## 2019-01-29 ENCOUNTER — Inpatient Hospital Stay (HOSPITAL_COMMUNITY)
Admission: EM | Admit: 2019-01-29 | Discharge: 2019-01-29 | Disposition: A | Discharge status: Home / Self Care | Payer: Medicaid Other | Attending: Obstetrics and Gynecology | Admitting: Obstetrics and Gynecology

## 2019-01-29 DIAGNOSIS — O34219 Maternal care for unspecified type scar from previous cesarean delivery: Secondary | ICD-10-CM | POA: Diagnosis not present

## 2019-01-29 DIAGNOSIS — Z8249 Family history of ischemic heart disease and other diseases of the circulatory system: Secondary | ICD-10-CM | POA: Insufficient documentation

## 2019-01-29 DIAGNOSIS — O139 Gestational [pregnancy-induced] hypertension without significant proteinuria, unspecified trimester: Secondary | ICD-10-CM | POA: Diagnosis present

## 2019-01-29 DIAGNOSIS — O24912 Unspecified diabetes mellitus in pregnancy, second trimester: Secondary | ICD-10-CM | POA: Insufficient documentation

## 2019-01-29 DIAGNOSIS — F17210 Nicotine dependence, cigarettes, uncomplicated: Secondary | ICD-10-CM | POA: Insufficient documentation

## 2019-01-29 DIAGNOSIS — Z3A27 27 weeks gestation of pregnancy: Secondary | ICD-10-CM | POA: Insufficient documentation

## 2019-01-29 DIAGNOSIS — O99332 Smoking (tobacco) complicating pregnancy, second trimester: Secondary | ICD-10-CM | POA: Insufficient documentation

## 2019-01-29 DIAGNOSIS — O219 Vomiting of pregnancy, unspecified: Secondary | ICD-10-CM | POA: Diagnosis not present

## 2019-01-29 DIAGNOSIS — Z833 Family history of diabetes mellitus: Secondary | ICD-10-CM | POA: Diagnosis not present

## 2019-01-29 DIAGNOSIS — F1290 Cannabis use, unspecified, uncomplicated: Secondary | ICD-10-CM | POA: Diagnosis not present

## 2019-01-29 DIAGNOSIS — O99322 Drug use complicating pregnancy, second trimester: Secondary | ICD-10-CM | POA: Insufficient documentation

## 2019-01-29 DIAGNOSIS — O132 Gestational [pregnancy-induced] hypertension without significant proteinuria, second trimester: Secondary | ICD-10-CM | POA: Diagnosis not present

## 2019-01-29 LAB — COMPREHENSIVE METABOLIC PANEL
ALT: 9 U/L (ref 0–44)
AST: 11 U/L — ABNORMAL LOW (ref 15–41)
Albumin: 2.6 g/dL — ABNORMAL LOW (ref 3.5–5.0)
Alkaline Phosphatase: 74 U/L (ref 38–126)
Anion gap: 10 (ref 5–15)
BUN: 5 mg/dL — ABNORMAL LOW (ref 6–20)
CO2: 20 mmol/L — ABNORMAL LOW (ref 22–32)
Calcium: 8.3 mg/dL — ABNORMAL LOW (ref 8.9–10.3)
Chloride: 100 mmol/L (ref 98–111)
Creatinine, Ser: 0.47 mg/dL (ref 0.44–1.00)
GFR calc Af Amer: >60 mL/min (ref >60)
GFR calc non Af Amer: >60 mL/min (ref >60)
Glucose, Bld: 232 mg/dL — ABNORMAL HIGH (ref 70–99)
Potassium: 3.5 mmol/L (ref 3.5–5.1)
Sodium: 130 mmol/L — ABNORMAL LOW (ref 135–145)
Total Bilirubin: 0.8 mg/dL (ref 0.3–1.2)
Total Protein: 6.5 g/dL (ref 6.5–8.1)

## 2019-01-29 LAB — URINALYSIS, ROUTINE W REFLEX MICROSCOPIC
Bilirubin Urine: NEGATIVE
Glucose, UA: >=500 mg/dL — AB
Hgb urine dipstick: NEGATIVE
Ketones, ur: 80 mg/dL — AB
Leukocytes,Ua: NEGATIVE
Nitrite: NEGATIVE
Protein, ur: 100 mg/dL — AB
Specific Gravity, Urine: 1.037 — ABNORMAL HIGH (ref 1.005–1.030)
pH: 5 (ref 5.0–8.0)

## 2019-01-29 LAB — PROTEIN / CREATININE RATIO, URINE
Creatinine, Urine: 189.26 mg/dL
Protein Creatinine Ratio: 0.27 mg/mg{Cre} — ABNORMAL HIGH (ref 0.00–0.15)
Total Protein, Urine: 52 mg/dL

## 2019-01-29 LAB — CBC
HCT: 36.8 % (ref 36.0–46.0)
Hemoglobin: 12.8 g/dL (ref 12.0–15.0)
MCH: 30 pg (ref 26.0–34.0)
MCHC: 34.8 g/dL (ref 30.0–36.0)
MCV: 86.2 fL (ref 80.0–100.0)
Platelets: 255 10*3/uL (ref 150–400)
RBC: 4.27 MIL/uL (ref 3.87–5.11)
RDW: 12.1 % (ref 11.5–15.5)
WBC: 10.1 10*3/uL (ref 4.0–10.5)
nRBC: 0 % (ref 0.0–0.2)

## 2019-01-29 MED ORDER — PROMETHAZINE HCL 25 MG/ML IJ SOLN
25.0000 mg | Freq: Once | INTRAVENOUS | Status: AC
  Administered 2019-01-29: 25 mg via INTRAVENOUS
  Filled 2019-01-29 (×2): qty 1

## 2019-01-29 MED ORDER — M.V.I. ADULT IV INJ
Freq: Once | INTRAVENOUS | Status: AC
  Administered 2019-01-29: 10:00:00 via INTRAVENOUS
  Filled 2019-01-29 (×2): qty 1000

## 2019-01-29 MED ORDER — FAMOTIDINE IN NACL 20-0.9 MG/50ML-% IV SOLN
20.0000 mg | Freq: Once | INTRAVENOUS | Status: AC
  Administered 2019-01-29: 20 mg via INTRAVENOUS
  Filled 2019-01-29: qty 50

## 2019-01-29 MED ORDER — SCOPOLAMINE 1 MG/3DAYS TD PT72: 1.0000 | MEDICATED_PATCH | TRANSDERMAL | 0 refills | Status: DC

## 2019-01-29 NOTE — Discharge Instructions (Signed)
You can apply a scopolamine patch behind your ear every 3 days as you need it for nausea and vomiting. Please wear gloves or wash your hands thoroughly after handling the patch, because it can cause TEMPORARY visual disturbances/blindness.

## 2019-01-29 NOTE — MAU Note (Signed)
Sent up from ER, "11mon, n/v"

## 2019-01-29 NOTE — Telephone Encounter (Signed)
TC to inform patient that she needs a BP check and repeat labs this weekend. Not able to leave VM on phone.  Laury Deep, CNM

## 2019-01-29 NOTE — MAU Note (Signed)
.  Ellen Martin is a 33 y.o. at [redacted]w[redacted]d here in MAU reporting: nausea and vomiting since yesterday morning. States burning in her chest from vomiting. Denies any VB or LOF. +FM  Onset of complaint: yesterday morning Pain score: 8 There were no vitals filed for this visit.   FHT:[id number] Lab orders placed from triage: UA

## 2019-01-29 NOTE — MAU Provider Note (Addendum)
History     CSN: [id number]  Arrival date and time: 01/29/19 9678   First Provider Initiated Contact with Patient 01/29/19 0815      Chief Complaint  Patient presents with  . Emesis   HPI  Ms.  Ellen Martin is a 33 y.o. year old [id number] female at [redacted]w[redacted]d weeks gestation who presents to MAU reporting N/V since yesterday morning. She reports burning in her chest from vomiting. She denies contractions, vaginal bleeding, loss of fluid, or abnormal vaginal discharge. She reports (+) FM. She receives [facility] with [place] (Dr. Melba Coon). Her pregnancy is complicated by: h/o gHTN, closely spaced pregnancies, Type 2 Diabetes, and obesity. She has been prescribed baby aspirin to take everyday, but she "does not take any medication." Last meal at 1900 on 01/28/19 -> unable to keep down; last drink at 0600 on 01/29/19.  Past Medical History:  Diagnosis Date  . Anxiety   . Blood transfusion without reported diagnosis   . Depression   . Diabetes mellitus without complication (HCC)    Type 2  . Hypertension     Past Surgical History:  Procedure Laterality Date  . CESAREAN SECTION MULTI-GESTATIONAL N/A 03/07/2018   Procedure: CESAREAN SECTION MULTI-GESTATIONAL;  Surgeon: Sloan Leiter, MD;  Location: [place];  Service: Obstetrics;  Laterality: N/A;  . NO PAST SURGERIES      Family History  Problem Relation Age of Onset  . Hypertension Mother   . Diabetes Mother   . Arthritis Mother   . Obesity Mother   . Hypertension Maternal Grandmother   . Diabetes Maternal Grandmother   . Arthritis Maternal Grandmother   . COPD Maternal Uncle   . Miscarriages / [place] Other     Social History   Tobacco Use  . Smoking status: Current Every Day Smoker    Packs/day: 0.50  . Smokeless tobacco: Never Used  Substance Use Topics  . Alcohol use: Not Currently  . Drug use: Yes    Types: Marijuana    Allergies: No Known Allergies  No medications prior to  admission.    Review of Systems  Constitutional: Positive for appetite change.  HENT: Negative.   Eyes: Negative.   Respiratory: Negative.   Cardiovascular: Negative.   Gastrointestinal: Positive for nausea and vomiting ("I can't keep anything down").  Endocrine: Negative.   Genitourinary: Negative.   Musculoskeletal: Negative.   Skin: Negative.   Allergic/Immunologic: Negative.   Neurological: Positive for weakness.  Hematological: Negative.   Psychiatric/Behavioral: Negative.    Physical Exam   Patient Vitals for the past 24 hrs:  BP Temp Pulse Resp SpO2 Height Weight  01/29/19 1217 (!) 144/68 - 83 16 - - -  01/29/19 1016 (!) 147/69 - 83 - - - -  01/29/19 1001 140/66 - 81 - - - -  01/29/19 0946 137/70 - 70 - - - -  01/29/19 0931 (!) 150/76 - 74 - - - -  01/29/19 0916 (!) 141/94 - 91 - - - -  01/29/19 0901 (!) 153/96 - (!) 102 - - - -  01/29/19 0831 (!) 158/84 - - - - - -  01/29/19 0816 (!) 151/84 - 81 - - - -  01/29/19 0801 (!) 153/86 - - - - - -  01/29/19 0757 (!) 155/86 97.8 F (36.6 C) 84 16 100 % 5\' 9"  (1.753 m) 131.1 kg  01/29/19 0756 (!) 155/86 - - - - - -    Physical Exam  Nursing note and  vitals reviewed. Constitutional: She is oriented to person, place, and time. She appears well-developed and well-nourished.  HENT:  Head: Normocephalic and atraumatic.  Eyes: Pupils are equal, round, and reactive to light.  Neck: Normal range of motion.  Cardiovascular: Normal rate, regular rhythm and normal heart sounds.  Respiratory: Effort normal and breath sounds normal.  GI: Soft. Bowel sounds are decreased. There is no abdominal tenderness.  Genitourinary:    Genitourinary Comments: Pelvic deferred   Musculoskeletal: Normal range of motion.  Neurological: She is alert and oriented to person, place, and time. She has normal reflexes.  Skin: Skin is warm and dry.  Psychiatric: She has a normal mood and affect. Her behavior is normal. Judgment and thought content  normal.   NST - FHR: 130 bpm / moderate variability / accels present / decels absent / TOCO: none  MAU Course  Procedures  MDM CCUA CBC CMP P/C Ratio Serial BP's  IVFs: Phenergan 25 mg in LR 1000 ml @ 999 ml/hr; followed by MVI in LR 1000 ml @ 999 ml/hr -- no nausea/vomiting Pepcid 20 mg IVPB PO Challenge -- patient tolerated well    Results for orders placed or performed during the hospital encounter of 01/29/19 (from the past 24 hour(s))  CBC     Status: None   Collection Time: 01/29/19  8:32 AM  Result Value Ref Range   WBC 10.1 4.0 - 10.5 K/uL   RBC 4.27 3.87 - 5.11 MIL/uL   Hemoglobin 12.8 12.0 - 15.0 g/dL   HCT 16.136.8 09.636.0 - 04.546.0 %   MCV 86.2 80.0 - 100.0 fL   MCH 30.0 26.0 - 34.0 pg   MCHC 34.8 30.0 - 36.0 g/dL   RDW 40.912.1 81.111.5 - 91.415.5 %   Platelets 255 150 - 400 K/uL   nRBC 0.0 0.0 - 0.2 %  Comprehensive metabolic panel     Status: Abnormal   Collection Time: 01/29/19  8:32 AM  Result Value Ref Range   Sodium 130 (L) 135 - 145 mmol/L   Potassium 3.5 3.5 - 5.1 mmol/L   Chloride 100 98 - 111 mmol/L   CO2 20 (L) 22 - 32 mmol/L   Glucose, Bld 232 (H) 70 - 99 mg/dL   BUN 5 (L) 6 - 20 mg/dL   Creatinine, Ser 7.820.47 0.44 - 1.00 mg/dL   Calcium 8.3 (L) 8.9 - 10.3 mg/dL   Total Protein 6.5 6.5 - 8.1 g/dL   Albumin 2.6 (L) 3.5 - 5.0 g/dL   AST 11 (L) 15 - 41 U/L   ALT 9 0 - 44 U/L   Alkaline Phosphatase 74 38 - 126 U/L   Total Bilirubin 0.8 0.3 - 1.2 mg/dL   GFR calc non Af Amer >60 >60 mL/min   GFR calc Af Amer >60 >60 mL/min   Anion gap 10 5 - 15  Urinalysis, Routine w reflex microscopic     Status: Abnormal   Collection Time: 01/29/19  8:43 AM  Result Value Ref Range   Color, Urine AMBER (A) YELLOW   APPearance HAZY (A) CLEAR   Specific Gravity, Urine 1.037 (H) 1.005 - 1.030   pH 5.0 5.0 - 8.0   Glucose, UA >=500 (A) NEGATIVE mg/dL   Hgb urine dipstick NEGATIVE NEGATIVE   Bilirubin Urine NEGATIVE NEGATIVE   Ketones, ur 80 (A) NEGATIVE mg/dL   Protein, ur 956100  (A) NEGATIVE mg/dL   Nitrite NEGATIVE NEGATIVE   Leukocytes,Ua NEGATIVE NEGATIVE   RBC / HPF 0-5 0 -  5 RBC/hpf   WBC, UA 0-5 0 - 5 WBC/hpf   Bacteria, UA RARE (A) NONE SEEN   Squamous Epithelial / LPF 6-10 0 - 5   Mucus PRESENT   Protein / creatinine ratio, urine     Status: Abnormal   Collection Time: 01/29/19  8:43 AM  Result Value Ref Range   Creatinine, Urine 189.26 mg/dL   Total Protein, Urine 52 mg/dL   Protein Creatinine Ratio 0.27 (H) 0.00 - 0.15 mg/mg[Cre]      Assessment and Plan  1. Nausea and vomiting during pregnancy - Rx for Scopolamine patch prn N/V - Information provided on morning sickness - Advised to call office, if any problems, questions or concerns  - Discharge patient - Patient verbalized an understanding of the plan of care and agrees.   2. Gestational hypertension, second trimester - Needs repeat labs and BP check on Saturday 01/31/19 or Sunday 02/01/19 - TC to patient d/t not being able to tell her prior to d/c home  No answer on phone number on file and unable to leave VM - TC to notify Dr. Jackelyn Knife about patient not being aware of the need to return for repeat labs and BP check - Message via Epic sent to Dr. Jackelyn Knife to contact patient   - Discharge patient - Patient verbalized an understanding of the plan of care and agrees.    [name], MSN, CNM 01/29/2019, 8:15 AM

## 2019-03-04 ENCOUNTER — Encounter (HOSPITAL_COMMUNITY): Payer: Self-pay | Admitting: Obstetrics and Gynecology

## 2019-03-04 ENCOUNTER — Other Ambulatory Visit: Payer: Self-pay

## 2019-03-04 ENCOUNTER — Inpatient Hospital Stay (HOSPITAL_COMMUNITY)
Admission: AD | Admit: 2019-03-04 | Discharge: 2019-03-04 | Disposition: A | Discharge status: Home / Self Care | Payer: Medicaid Other | Attending: Obstetrics and Gynecology | Admitting: Obstetrics and Gynecology

## 2019-03-04 DIAGNOSIS — Z3A32 32 weeks gestation of pregnancy: Secondary | ICD-10-CM

## 2019-03-04 DIAGNOSIS — O219 Vomiting of pregnancy, unspecified: Secondary | ICD-10-CM | POA: Diagnosis present

## 2019-03-04 DIAGNOSIS — Z3A00 Weeks of gestation of pregnancy not specified: Secondary | ICD-10-CM | POA: Diagnosis not present

## 2019-03-04 LAB — URINALYSIS, ROUTINE W REFLEX MICROSCOPIC
Glucose, UA: 100 mg/dL — AB
Hgb urine dipstick: NEGATIVE
Ketones, ur: >80 mg/dL — AB
Leukocytes,Ua: NEGATIVE
Nitrite: NEGATIVE
Protein, ur: 30 mg/dL — AB
Specific Gravity, Urine: >1.03 — ABNORMAL HIGH (ref 1.005–1.030)
pH: 5.5 (ref 5.0–8.0)

## 2019-03-04 LAB — URINALYSIS, MICROSCOPIC (REFLEX)

## 2019-03-04 MED ORDER — SCOPOLAMINE 1 MG/3DAYS TD PT72: 1.0000 | MEDICATED_PATCH | TRANSDERMAL | 0 refills | Status: DC

## 2019-03-04 MED ORDER — PROMETHAZINE HCL 25 MG/ML IJ SOLN
25.0000 mg | Freq: Once | INTRAVENOUS | Status: AC
  Administered 2019-03-04: 15:00:00 25 mg via INTRAVENOUS
  Filled 2019-03-04: qty 1

## 2019-03-04 MED ORDER — M.V.I. ADULT IV INJ
Freq: Once | INTRAVENOUS | Status: AC
  Filled 2019-03-04: qty 1000

## 2019-03-04 NOTE — MAU Provider Note (Signed)
History     CSN: [id number]  Arrival date and time: 03/04/19 1233   First Provider Initiated Contact with Patient 03/04/19 1348      Chief Complaint  Patient presents with  . Emesis   HPI  Ms.  Ellen Martin is a 33 y.o. year old [phone] female at [redacted]w[redacted]d weeks gestation who presents to MAU reporting she "thinks she ate something that did not settle on her stomach well." She states the vomiting started about 3 days ago and she has not been able to keep anything down since. She has not used any medication for N/V; "ran out of patches Rx'd before."  Past Medical History:  Diagnosis Date  . Anxiety   . Blood transfusion without reported diagnosis   . Depression   . Diabetes mellitus without complication (HCC)    Type 2  . Hypertension     Past Surgical History:  Procedure Laterality Date  . CESAREAN SECTION MULTI-GESTATIONAL N/A 03/07/2018   Procedure: CESAREAN SECTION MULTI-GESTATIONAL;  Surgeon: Sloan Leiter, MD;  Location: [place];  Service: Obstetrics;  Laterality: N/A;  . NO PAST SURGERIES      Family History  Problem Relation Age of Onset  . Hypertension Mother   . Diabetes Mother   . Arthritis Mother   . Obesity Mother   . Hypertension Maternal Grandmother   . Diabetes Maternal Grandmother   . Arthritis Maternal Grandmother   . COPD Maternal Uncle   . Miscarriages / [place] Other     Social History   Tobacco Use  . Smoking status: Current Every Day Smoker    Packs/day: 0.50  . Smokeless tobacco: Never Used  Substance Use Topics  . Alcohol use: Not Currently  . Drug use: Not Currently    Types: Marijuana    Comment: "a while ago"    Allergies: No Known Allergies  Medications Prior to Admission  Medication Sig Dispense Refill Last Dose  . hydrOXYzine (ATARAX/VISTARIL) 25 MG tablet Take 1 tablet (25 mg total) by mouth every 6 (six) hours as needed for anxiety. 30 tablet 0   . metFORMIN (GLUCOPHAGE) 500 MG tablet Take 1 tablet (500 mg  total) by mouth 2 (two) times daily with a meal. For diabetes (Patient not taking: Reported on 05/20/2018) 60 tablet 3   . scopolamine (TRANSDERM-SCOP, 1.5 MG,) 1 MG/3DAYS Place 1 patch (1.5 mg total) onto the skin every 3 (three) days. 4 patch 0   . traZODone (DESYREL) 50 MG tablet Take 1 tablet (50 mg total) by mouth at bedtime as needed for sleep. 30 tablet 0     Review of Systems  Constitutional: Positive for appetite change (unable to kee anything down for 3 days).  HENT: Negative.   Eyes: Negative.   Respiratory: Negative.   Cardiovascular: Negative.   Gastrointestinal: Positive for nausea and vomiting.  Endocrine: Negative.   Genitourinary: Negative.   Musculoskeletal: Negative.   Skin: Negative.   Allergic/Immunologic: Negative.   Neurological: Negative.   Hematological: Negative.   Psychiatric/Behavioral: Negative.    Physical Exam   Blood pressure 133/72, pulse (!) 102, temperature 98.2 F (36.8 C), resp. rate 16, weight 127 kg, last menstrual period 07/18/2018, SpO2 100 %, not currently breastfeeding.  Physical Exam  Nursing note and vitals reviewed. Constitutional: She is oriented to person, place, and time. She appears well-developed and well-nourished.  HENT:  Head: Normocephalic and atraumatic.  Eyes: Pupils are equal, round, and reactive to light.  Cardiovascular: Normal rate.  Respiratory: Effort  normal.  GI: Soft.  Genitourinary:    Genitourinary Comments: Not indicated   Musculoskeletal:        General: Normal range of motion.     Cervical back: Normal range of motion.  Neurological: She is alert and oriented to person, place, and time.  Skin: Skin is warm and dry.  Psychiatric: She has a normal mood and affect. Her behavior is normal. Judgment and thought content normal.   NST - FHR: 140 bpm / moderate variability / accels present / decels absent / TOCO: none   MAU Course  Procedures  MDM CCUA CEFM IVFs: Phenergan 25 mg in LR 1000 ml @ 999 ml/hr;  followed by MVI in LR 1000 ml @ 500 ml/hr -- resolved nausea/vomiting PO Challenge -- patient tolerated well   Assessment and Plan  Nausea and vomiting of pregnancy, antepartum - Plan: Discharge patient - Renew Rx for Scopolamine patch - Information provided on morning sickness - Advised to advance diet slowly - Msg sent to [facility] to get patient scheduled for [facility] appt ASAP due to the large gap in [facility] - Patient verbalized an understanding of the plan of care and agrees.   Ellen Mora, MSN, CNM 03/04/2019, 1:48 PM

## 2019-03-04 NOTE — MAU Note (Signed)
.   Ellen Martin is a 33 y.o. at [redacted]w[redacted]d here in MAU reporting: vomiting for 3 days and not able to keep anything down. Denies any pain  Onset of complaint: 3 days Pain score: 0 Vitals:   03/04/19 1254  BP: (!) 157/94  Pulse: (!) 113  Resp: 16  Temp: 98.2 F (36.8 C)  SpO2: 100%     FHT: Lab orders placed from triage: UA

## 2019-03-09 ENCOUNTER — Other Ambulatory Visit: Payer: Self-pay | Admitting: Obstetrics and Gynecology

## 2019-03-09 ENCOUNTER — Telehealth: Payer: Self-pay | Admitting: Obstetrics and Gynecology

## 2019-03-09 NOTE — Telephone Encounter (Signed)
TC to [employer]. Spoke with Tamika @ 1550 incident #: [id number]. Prior authorization #: [id number]. Back dated from 03/02/2019 and will expire on 04/02/2019.   Patient informed of prior authorization approval.  [name], CNM  03/09/2019 4:45 PM

## 2019-03-09 NOTE — Telephone Encounter (Signed)
Return TC from patient requesting prior authorization be completed by prescribing provider. She paid $96 out of pocket for Scopolamine patch, because Medicaid will not cover without a prior authorization. If the prior authorization is completed, she will get her money reimbursed to her.  TC to [facility], [facility] pharmacist [phone]), to verify number they were faxing it. Number verified. Fax received and sent back to [phone].  Rx request approved in Epic.  [name], CNM  03/09/2019 1:27 PM

## 2019-03-18 ENCOUNTER — Ambulatory Visit (INDEPENDENT_AMBULATORY_CARE_PROVIDER_SITE_OTHER): Payer: Medicaid Other | Admitting: Obstetrics & Gynecology

## 2019-03-18 ENCOUNTER — Encounter: Payer: Self-pay | Admitting: Obstetrics & Gynecology

## 2019-03-18 ENCOUNTER — Other Ambulatory Visit: Payer: Self-pay

## 2019-03-18 VITALS — BP 119/79 | HR 106 | Wt 299.0 lb

## 2019-03-18 DIAGNOSIS — O24119 Pre-existing diabetes mellitus, type 2, in pregnancy, unspecified trimester: Secondary | ICD-10-CM

## 2019-03-18 DIAGNOSIS — Z3A34 34 weeks gestation of pregnancy: Secondary | ICD-10-CM

## 2019-03-18 DIAGNOSIS — O219 Vomiting of pregnancy, unspecified: Secondary | ICD-10-CM | POA: Diagnosis not present

## 2019-03-18 DIAGNOSIS — O0993 Supervision of high risk pregnancy, unspecified, third trimester: Secondary | ICD-10-CM | POA: Diagnosis not present

## 2019-03-18 DIAGNOSIS — O24113 Pre-existing diabetes mellitus, type 2, in pregnancy, third trimester: Secondary | ICD-10-CM | POA: Diagnosis not present

## 2019-03-18 DIAGNOSIS — O0990 Supervision of high risk pregnancy, unspecified, unspecified trimester: Secondary | ICD-10-CM | POA: Insufficient documentation

## 2019-03-18 MED ORDER — PANTOPRAZOLE SODIUM 40 MG PO TBEC: 40.0000 mg | DELAYED_RELEASE_TABLET | Freq: Every day | ORAL | 1 refills | Status: DC

## 2019-03-18 NOTE — Patient Instructions (Addendum)
Type 1 or Type 2 Diabetes Mellitus During Pregnancy, Diagnosis Type 1 diabetes (type 1 diabetes mellitus) and type 2 diabetes (type 2 diabetes mellitus) are long-term (chronic) diseases. Your diabetes may be caused by one or both of these problems:  Your pancreas does not make enough of a hormone called insulin.  Your pancreas does not respond in a normal way to insulin that it makes. Insulin lets sugars (glucose) go into cells in the body. This gives you energy. If you have diabetes, sugars cannot get into cells. This causes high blood sugar (hyperglycemia). If diabetes is treated, it may not hurt you or your baby. Your doctor will set treatment goals for you. In general, you should have these blood sugar levels:  After not eating for a long time (fasting): 95 mg/dL (5.3 mmol/L).  After meals (postprandial): ? One hour after a meal: at or below 140 mg/dL (7.8 mmol/L). ? Two hours after a meal: at or below 120 mg/dL (6.7 mmol/L).  A1c (hemoglobin A1c) level: 6-6.5%. Follow these instructions at home: Questions to ask your doctor  You may want to ask these questions: ? Do I need to meet with a diabetes educator? ? Where can I find a support group for people with diabetes? ? What equipment will I need to care for myself at home? ? What medicines do I need? When should I take them? ? How often do I need to check my blood sugar? ? What number can I call if I have questions? ? When is my next doctor's visit? General instructions  Take over-the-counter and prescription medicines only as told by your doctor.  Stay at a healthy weight during pregnancy.  Keep all follow-up visits as told by your doctor. This is important. Contact a doctor if:  Your blood sugar is at or above 240 mg/dL (03.4 mmol/L).  Your blood sugar is at or above 200 mg/dL (74.2 mmol/L), and you have ketones in your pee (urine).  You have been sick or have had a fever for 2 days or more and you are not getting  better.  You have any of these problems for more than 6 hours: ? You cannot eat or drink. ? You feel sick to your stomach (nauseous). ? You throw up (vomit). ? You have watery poop (diarrhea). Get help right away if:  Your blood sugar is lower than 54 mg/dL (3 mmol/L).  You get confused.  You have trouble: ? Thinking clearly. ? Breathing.  Your baby moves less than normal.  You have: ? Moderate or large ketone levels in your pee. ? Vaginal bleeding. ? Unusual fluid coming from your vagina. ? Early contractions. These may feel like tightness in your belly. Summary  Type 1 diabetes (type 1 diabetes mellitus) and type 2 diabetes (type 2 diabetes mellitus) are long-term (chronic) diseases.  If diabetes is treated, it may not hurt you or your baby.  Your doctor will set treatment goals for you. This information is not intended to replace advice given to you by your health care provider. Make sure you discuss any questions you have with your health care provider. Document Revised: 06/10/2018 Document Reviewed: 03/25/2015 Elsevier Patient Education  2020 [employer]. Vaginal Birth After Cesarean Delivery  Vaginal birth after cesarean delivery (VBAC) is giving birth vaginally after previously delivering a baby through a cesarean section (C-section). A VBAC may be a safe option for you, depending on your health and other factors. It is important to discuss VBAC with your  health care provider early in your pregnancy so you can understand the risks, benefits, and options. Having these discussions early will give you time to make your birth plan. Who are the best candidates for VBAC? The best candidates for VBAC are women who:  Have had one or two prior cesarean deliveries, and the incision made during the delivery was horizontal (low transverse).  Do not have a vertical (classical) scar on their uterus.  Have not had a tear in the wall of their uterus (uterine rupture).  Plan to  have more pregnancies. A VBAC is also more likely to be successful:  In women who have previously given birth vaginally.  When labor starts by itself (spontaneously) before the due date. What are the benefits of VBAC? The benefits of delivering your baby vaginally instead of by a cesarean delivery include:  A shorter hospital stay.  A faster recovery time.  Less pain.  Avoiding risks associated with major surgery, such as infection and blood clots.  Less blood loss and less need for donated blood (transfusions). What are the risks of VBAC? The main risk of attempting a VBAC is that it may fail, forcing your health care provider to deliver your baby by a C-section. Other risks are rare and include:  Tearing (rupture) of the scar from a past cesarean delivery.  Other risks associated with vaginal deliveries. If a repeat cesarean delivery is needed, the risks include:  Blood loss.  Infection.  Blood clot.  Damage to surrounding organs.  Removal of the uterus (hysterectomy), if it is damaged.  Placenta problems in future pregnancies. What else should I know about my options? Delivering a baby through a VBAC is similar to having a normal spontaneous vaginal delivery. Therefore, it is safe:  To try with twins.  For your health care provider to try to turn the baby from a breech position (external cephalic version) during labor.  With epidural analgesia for pain relief. Consider where you would like to deliver your baby. VBAC should be attempted in facilities where an emergency cesarean delivery can be performed. VBAC is not recommended for home births. Any changes in your health or your baby's health during your pregnancy may make it necessary to change your initial decision about VBAC. Your health care provider may recommend that you do not attempt a VBAC if:  Your baby's suspected weight is 8.8 lb (4 kg) or more.  You have preeclampsia. This is a condition that causes  high blood pressure along with other symptoms, such as swelling and headaches.  You will have VBAC less than 19 months after your cesarean delivery.  You are past your due date.  You need to have labor started (induced) because your cervix is not ready for labor (unfavorable). Where to find more information  American Pregnancy Association: americanpregnancy.org  [employer]: acog.org Summary  Vaginal birth after cesarean delivery (VBAC) is giving birth vaginally after previously delivering a baby through a cesarean section (C-section). A VBAC may be a safe option for you, depending on your health and other factors.  Discuss VBAC with your health care provider early in your pregnancy so you can understand the risks, benefits, options, and have plenty of time to make your birth plan.  The main risk of attempting a VBAC is that it may fail, forcing your health care provider to deliver your baby by a C-section. Other risks are rare. This information is not intended to replace advice given to you  by your health care provider. Make sure you discuss any questions you have with your health care provider. Document Revised: 06/17/2018 Document Reviewed: 05/29/2016 Elsevier Patient Education  [place].

## 2019-03-18 NOTE — Progress Notes (Signed)
Subjective:transfer [facility]    Ellen Martin is a [id number] [redacted]w[redacted]d being seen today for her first obstetrical visit.  Her obstetrical history is significant for non-compliance, obesity and previous CS and Class B DM . Patient does intend to breast feed. Pregnancy history fully reviewed.  Patient reports nausea.  Vitals:   03/18/19 1320  BP: 119/79  Pulse: (!) 106  Weight: 299 lb (135.6 kg)    HISTORY: OB History  Gravida Para Term Preterm AB Living  8 4 4   3 5   SAB TAB Ectopic Multiple Live Births  3     1 5     # Outcome Date GA Lbr Len/2nd Weight Sex Delivery Anes PTL Lv  8 Current           7A Term 03/07/18 [redacted]w[redacted]d  6 lb 0.1 oz (2.725 kg) M CS-LTranv Spinal  LIV  7B Term 03/07/18 [redacted]w[redacted]d  4 lb 7.6 oz (2.03 kg) F CS-LTranv Spinal  LIV  6 Term 11/15/12 [redacted]w[redacted]d   F Vag-Spont   LIV  5 Term 04/15/07 [redacted]w[redacted]d   F Vag-Spont   LIV     Birth Comments: PPH with 2 units or blood PP  4 Term 01/09/06 [redacted]w[redacted]d   F Vag-Spont   LIV     Birth Comments: heavy bleeding post partum stopped with a shot  3 SAB           2 SAB           1 SAB            Past Medical History:  Diagnosis Date  . Anxiety   . Blood transfusion without reported diagnosis   . Depression   . Diabetes mellitus without complication (HCC)    Type 2  . Hypertension    Past Surgical History:  Procedure Laterality Date  . CESAREAN SECTION MULTI-GESTATIONAL N/A 03/07/2018   Procedure: CESAREAN SECTION MULTI-GESTATIONAL;  Surgeon: Sloan Leiter, MD;  Location: [place];  Service: Obstetrics;  Laterality: N/A;  . NO PAST SURGERIES     Family History  Problem Relation Age of Onset  . Hypertension Mother   . Diabetes Mother   . Arthritis Mother   . Obesity Mother   . Hypertension Maternal Grandmother   . Diabetes Maternal Grandmother   . Arthritis Maternal Grandmother   . COPD Maternal Uncle   . Miscarriages / Stillbirths Other      Exam    Uterus:     Pelvic Exam:                                    Skin: normal coloration and turgor, no rashes, few areas of folliculitis    Neurologic: oriented, normal mood, grossly non-focal   Extremities: normal strength, tone, and muscle mass   HEENT PERRLA, extra ocular movement intact and sclera clear, anicteric   Mouth/Teeth mucous membranes moist, pharynx normal without lesions and dental hygiene good   Neck supple   Cardiovascular: regular rate and rhythm   Respiratory:  appears well, vitals normal, no respiratory distress, acyanotic, normal RR, neck free of mass or lymphadenopathy   Abdomen: soft, non-tender; bowel sounds normal; no masses,  no organomegaly   Urinary:        Assessment:    Pregnancy: [id number] Patient Active Problem List   Diagnosis Date Noted  . Supervision of high risk pregnancy, antepartum 03/18/2019  . Nausea and vomiting  of pregnancy, antepartum 01/29/2019  . Suicidal ideations   . Adjustment disorder with mixed disturbance of emotions and conduct 05/21/2018  . MDD (major depressive disorder), single episode, moderate (HCC) 05/21/2018  . Suicide attempt (HCC)   . Pre-existing type 2 diabetes affecting pregnancy, antepartum 01/30/2018  . Gallstone 01/30/2018  . Gestational hypertension 01/28/2018        Plan:     Initial labs drawn. Prenatal vitamins. Problem list reviewed and updated. 50% of 30 min visit spent on counseling and coordination of care.  DM and MFM referral, will need insulin ASAP  TOLAC consent, counseled of risk of uterine rupture, consents to [name]  Scheryl Darter 03/18/2019

## 2019-03-18 NOTE — Progress Notes (Signed)
Pt states she has been having continual nausea.  Pt was using Scope patch with relief, pt is out of Rx.

## 2019-03-19 ENCOUNTER — Encounter (HOSPITAL_COMMUNITY): Payer: Self-pay | Admitting: Obstetrics and Gynecology

## 2019-03-19 ENCOUNTER — Other Ambulatory Visit: Payer: Self-pay

## 2019-03-19 ENCOUNTER — Inpatient Hospital Stay (HOSPITAL_COMMUNITY)
Admission: AD | Admit: 2019-03-19 | Discharge: 2019-03-20 | Disposition: A | Discharge status: Home / Self Care | Payer: Medicaid Other | Attending: Obstetrics and Gynecology | Admitting: Obstetrics and Gynecology

## 2019-03-19 DIAGNOSIS — Z3A35 35 weeks gestation of pregnancy: Secondary | ICD-10-CM | POA: Insufficient documentation

## 2019-03-19 DIAGNOSIS — O34219 Maternal care for unspecified type scar from previous cesarean delivery: Secondary | ICD-10-CM | POA: Insufficient documentation

## 2019-03-19 DIAGNOSIS — Z833 Family history of diabetes mellitus: Secondary | ICD-10-CM | POA: Insufficient documentation

## 2019-03-19 DIAGNOSIS — O4703 False labor before 37 completed weeks of gestation, third trimester: Secondary | ICD-10-CM | POA: Insufficient documentation

## 2019-03-19 DIAGNOSIS — O24113 Pre-existing diabetes mellitus, type 2, in pregnancy, third trimester: Secondary | ICD-10-CM | POA: Insufficient documentation

## 2019-03-19 DIAGNOSIS — F17210 Nicotine dependence, cigarettes, uncomplicated: Secondary | ICD-10-CM | POA: Insufficient documentation

## 2019-03-19 DIAGNOSIS — O99333 Smoking (tobacco) complicating pregnancy, third trimester: Secondary | ICD-10-CM | POA: Insufficient documentation

## 2019-03-19 DIAGNOSIS — O99891 Other specified diseases and conditions complicating pregnancy: Secondary | ICD-10-CM | POA: Insufficient documentation

## 2019-03-19 DIAGNOSIS — O24119 Pre-existing diabetes mellitus, type 2, in pregnancy, unspecified trimester: Secondary | ICD-10-CM

## 2019-03-19 DIAGNOSIS — E1165 Type 2 diabetes mellitus with hyperglycemia: Secondary | ICD-10-CM | POA: Insufficient documentation

## 2019-03-19 DIAGNOSIS — Z8249 Family history of ischemic heart disease and other diseases of the circulatory system: Secondary | ICD-10-CM | POA: Insufficient documentation

## 2019-03-19 DIAGNOSIS — O479 False labor, unspecified: Secondary | ICD-10-CM

## 2019-03-19 DIAGNOSIS — O219 Vomiting of pregnancy, unspecified: Secondary | ICD-10-CM

## 2019-03-19 DIAGNOSIS — Z79899 Other long term (current) drug therapy: Secondary | ICD-10-CM | POA: Insufficient documentation

## 2019-03-19 DIAGNOSIS — R109 Unspecified abdominal pain: Secondary | ICD-10-CM | POA: Insufficient documentation

## 2019-03-19 DIAGNOSIS — O163 Unspecified maternal hypertension, third trimester: Secondary | ICD-10-CM | POA: Insufficient documentation

## 2019-03-19 LAB — COMPREHENSIVE METABOLIC PANEL
ALT: 6 IU/L (ref 0–32)
AST: 12 IU/L (ref 0–40)
Albumin/Globulin Ratio: 1 — ABNORMAL LOW (ref 1.2–2.2)
Albumin: 2.9 g/dL — ABNORMAL LOW (ref 3.8–4.8)
Alkaline Phosphatase: 98 IU/L (ref 39–117)
BUN/Creatinine Ratio: 9 (ref 9–23)
BUN: 5 mg/dL — ABNORMAL LOW (ref 6–20)
Bilirubin Total: 0.3 mg/dL (ref 0.0–1.2)
CO2: 25 mmol/L (ref 20–29)
Calcium: 9.2 mg/dL (ref 8.7–10.2)
Chloride: 96 mmol/L (ref 96–106)
Creatinine, Ser: 0.55 mg/dL — ABNORMAL LOW (ref 0.57–1.00)
GFR calc Af Amer: 142 mL/min/{1.73_m2} (ref >59)
GFR calc non Af Amer: 123 mL/min/{1.73_m2} (ref >59)
Globulin, Total: 2.8 g/dL (ref 1.5–4.5)
Glucose: 268 mg/dL — ABNORMAL HIGH (ref 65–99)
Potassium: 4.8 mmol/L (ref 3.5–5.2)
Sodium: 132 mmol/L — ABNORMAL LOW (ref 134–144)
Total Protein: 5.7 g/dL — ABNORMAL LOW (ref 6.0–8.5)

## 2019-03-19 LAB — PROTEIN / CREATININE RATIO, URINE
Creatinine, Urine: 110.7 mg/dL
Protein, Ur: 19.2 mg/dL
Protein/Creat Ratio: 173 mg/g creat (ref 0–200)

## 2019-03-19 NOTE — Progress Notes (Signed)
Normal lab

## 2019-03-19 NOTE — MAU Note (Signed)
Ctxs for an hour. Denies VB or LOF.

## 2019-03-19 NOTE — MAU Provider Note (Signed)
Chief Complaint:  Contractions   First Provider Initiated Contact with Patient 03/19/19 2357     HPI: Ellen Martin is a 34 y.o. [id number] at 67w6dwho presents to maternity admissions reporting contractions off and on for an hour.. Has only felt one here.  Pregnancy has been remarkable for late to care and diabetes. Is scheduled for Nutrition appt today.  Scheduled to start Insulin asap and appt with Dr Jolayne Panther in 5 days.. She reports good fetal movement, denies LOF, vaginal bleeding, vaginal itching/burning, urinary symptoms, h/a, dizziness, n/v, diarrhea, constipation or fever/chills.  She denies headache, visual changes or RUQ abdominal pain.  Abdominal Pain This is a new problem. The current episode started today. The onset quality is gradual. The problem occurs intermittently. The problem has been rapidly improving. The pain is mild. The quality of the pain is cramping. The abdominal pain does not radiate. Pertinent negatives include no constipation, diarrhea, fever, frequency or nausea. Nothing aggravates the pain. The pain is relieved by nothing. She has tried nothing for the symptoms.    RN Note: Ctxs for an hour. Denies VB or LOF.   Past Medical History: Past Medical History:  Diagnosis Date  . Anxiety   . Blood transfusion without reported diagnosis   . Depression   . Diabetes mellitus without complication (HCC)    Type 2  . Hypertension     Past obstetric history: OB History  Gravida Para Term Preterm AB Living  8 4 4   3 5   SAB TAB Ectopic Multiple Live Births  3     1 5     # Outcome Date GA Lbr Len/2nd Weight Sex Delivery Anes PTL Lv  8 Current           7A Term 03/07/18 [redacted]w[redacted]d  2725 g M CS-LTranv Spinal  LIV  7B Term 03/07/18 [redacted]w[redacted]d  2030 g F CS-LTranv Spinal  LIV  6 Term 11/15/12 [redacted]w[redacted]d   F Vag-Spont   LIV  5 Term 04/15/07 [redacted]w[redacted]d   F Vag-Spont   LIV     Birth Comments: PPH with 2 units or blood PP  4 Term 01/09/06 [redacted]w[redacted]d   F Vag-Spont   LIV     Birth Comments: heavy  bleeding post partum stopped with a shot  3 SAB           2 SAB           1 SAB             Past Surgical History: Past Surgical History:  Procedure Laterality Date  . CESAREAN SECTION    . CESAREAN SECTION MULTI-GESTATIONAL N/A 03/07/2018   Procedure: CESAREAN SECTION MULTI-GESTATIONAL;  Surgeon: [redacted]w[redacted]d, MD;  Location: [facility] BIRTHING SUITES;  Service: Obstetrics;  Laterality: N/A;  . NO PAST SURGERIES      Family History: Family History  Problem Relation Age of Onset  . Hypertension Mother   . Diabetes Mother   . Arthritis Mother   . Obesity Mother   . Hypertension Maternal Grandmother   . Diabetes Maternal Grandmother   . Arthritis Maternal Grandmother   . COPD Maternal Uncle   . Miscarriages / Conan Bowens Other     Social History: Social History   Tobacco Use  . Smoking status: Current Every Day Smoker    Packs/day: 0.25    Types: Cigarettes  . Smokeless tobacco: Never Used  . Tobacco comment: 1-2 per day  Substance Use Topics  . Alcohol use: Not Currently  . Drug  use: Not Currently    Types: Marijuana    Comment: "a while ago"    Allergies: No Known Allergies  Meds:  Medications Prior to Admission  Medication Sig Dispense Refill Last Dose  . scopolamine (TRANSDERM-SCOP) 1 MG/3DAYS UNWRAP AND APPLY 1 PATCH TO SKIN EVERY 3 DAYS 4 patch 0 Past Month at Unknown time  . hydrOXYzine (ATARAX/VISTARIL) 25 MG tablet Take 1 tablet (25 mg total) by mouth every 6 (six) hours as needed for anxiety. (Patient not taking: Reported on 03/18/2019) 30 tablet 0   . metFORMIN (GLUCOPHAGE) 500 MG tablet Take 1 tablet (500 mg total) by mouth 2 (two) times daily with a meal. For diabetes (Patient not taking: Reported on 05/20/2018) 60 tablet 3   . pantoprazole (PROTONIX) 40 MG tablet Take 1 tablet (40 mg total) by mouth daily. 30 tablet 1   . traZODone (DESYREL) 50 MG tablet Take 1 tablet (50 mg total) by mouth at bedtime as needed for sleep. (Patient not taking: Reported on  03/18/2019) 30 tablet 0     I have reviewed patient's Past Medical Hx, Surgical Hx, Family Hx, Social Hx, medications and allergies.   ROS:  Review of Systems  Constitutional: Negative for chills and fever.  Respiratory: Negative for shortness of breath.   Gastrointestinal: Positive for abdominal pain. Negative for constipation, diarrhea and nausea.  Genitourinary: Negative for frequency, vaginal bleeding and vaginal discharge.  Neurological: Negative for dizziness.   Other systems negative  Physical Exam   Patient Vitals for the past 24 hrs:  BP Temp Pulse Resp Height Weight  03/19/19 2328 125/69 -- (!) 104 20 -- --  03/19/19 2324 -- 97.6 F (36.4 C) -- -- 5\' 9"  (1.753 m) 136.1 kg   Constitutional: Well-developed, well-nourished female in no acute distress.  Cardiovascular: normal rate and rhythm Respiratory: normal effort, clear to auscultation bilaterally GI: Abd soft, non-tender, gravid appropriate for gestational age.   No rebound or guarding. MS: Extremities nontender, no edema, normal ROM Neurologic: Alert and oriented x 4.  GU: Neg CVAT.  PELVIC EXAM:   Dilation: Fingertip Effacement (%): Thick Station: Ballotable Exam by:: Hansel Feinstein, CNM   FHT:  Baseline 140 , moderate variability, accelerations present, no decelerations Contractions: Intermittent uterine irritability   Labs: Results for orders placed or performed during the hospital encounter of 03/19/19 (from the past 24 hour(s))  Urinalysis, Routine w reflex microscopic     Status: Abnormal   Collection Time: 03/19/19 11:53 PM  Result Value Ref Range   Color, Urine YELLOW YELLOW   APPearance HAZY (A) CLEAR   Specific Gravity, Urine 1.013 1.005 - 1.030   pH 7.0 5.0 - 8.0   Glucose, UA >=500 (A) NEGATIVE mg/dL   Hgb urine dipstick NEGATIVE NEGATIVE   Bilirubin Urine NEGATIVE NEGATIVE   Ketones, ur 5 (A) NEGATIVE mg/dL   Protein, ur 30 (A) NEGATIVE mg/dL   Nitrite NEGATIVE NEGATIVE   Leukocytes,Ua  LARGE (A) NEGATIVE   RBC / HPF 0-5 0 - 5 RBC/hpf   WBC, UA 11-20 0 - 5 WBC/hpf   Bacteria, UA RARE (A) NONE SEEN   Squamous Epithelial / LPF 6-10 0 - 5  Glucose, capillary     Status: Abnormal   Collection Time: 03/20/19  1:10 AM  Result Value Ref Range   Glucose-Capillary 272 (H) 70 - 99 mg/dL  (12 hours post meal)     Imaging:  No results found.  MAU Course/MDM: I have ordered labs and reviewed results.  Insulin 4 units given for hyperglycemia NST reviewed, reactive, no contractions, just some irritability Consult Dr Alysia Penna with presentation, exam findings and test results.  Treatments in MAU included EFM, PO hydration, Insulin 4 units Has appt today with Nutrition center.  Will get meter and start testing.   Has appt in office in person in 5 days to start insulin..    Assessment: Single IUP at [redacted]w[redacted]d Uterine irritability Hyperglycemia in setting of uncontrolled T2DM  Plan: Discharge home Labor precautions and fetal kick counts Follow up in Office for prenatal visits   Encouraged to return here or to other Urgent Care/ED if she develops worsening of symptoms, increase in pain, fever, or other concerning symptoms.   Pt stable at time of discharge.  Wynelle Bourgeois CNM, MSN Certified Nurse-Midwife 03/19/2019 11:57 PM

## 2019-03-20 DIAGNOSIS — Z79899 Other long term (current) drug therapy: Secondary | ICD-10-CM | POA: Diagnosis not present

## 2019-03-20 DIAGNOSIS — O4703 False labor before 37 completed weeks of gestation, third trimester: Secondary | ICD-10-CM | POA: Diagnosis not present

## 2019-03-20 DIAGNOSIS — O99333 Smoking (tobacco) complicating pregnancy, third trimester: Secondary | ICD-10-CM | POA: Diagnosis not present

## 2019-03-20 DIAGNOSIS — O163 Unspecified maternal hypertension, third trimester: Secondary | ICD-10-CM | POA: Diagnosis not present

## 2019-03-20 DIAGNOSIS — O24119 Pre-existing diabetes mellitus, type 2, in pregnancy, unspecified trimester: Secondary | ICD-10-CM | POA: Diagnosis present

## 2019-03-20 DIAGNOSIS — E1165 Type 2 diabetes mellitus with hyperglycemia: Secondary | ICD-10-CM | POA: Diagnosis not present

## 2019-03-20 DIAGNOSIS — O99891 Other specified diseases and conditions complicating pregnancy: Secondary | ICD-10-CM | POA: Diagnosis not present

## 2019-03-20 DIAGNOSIS — R109 Unspecified abdominal pain: Secondary | ICD-10-CM | POA: Diagnosis not present

## 2019-03-20 DIAGNOSIS — O24113 Pre-existing diabetes mellitus, type 2, in pregnancy, third trimester: Secondary | ICD-10-CM | POA: Diagnosis not present

## 2019-03-20 DIAGNOSIS — Z3A35 35 weeks gestation of pregnancy: Secondary | ICD-10-CM | POA: Diagnosis not present

## 2019-03-20 DIAGNOSIS — Z833 Family history of diabetes mellitus: Secondary | ICD-10-CM | POA: Diagnosis not present

## 2019-03-20 DIAGNOSIS — F17210 Nicotine dependence, cigarettes, uncomplicated: Secondary | ICD-10-CM | POA: Diagnosis not present

## 2019-03-20 DIAGNOSIS — O34219 Maternal care for unspecified type scar from previous cesarean delivery: Secondary | ICD-10-CM | POA: Diagnosis not present

## 2019-03-20 DIAGNOSIS — Z8249 Family history of ischemic heart disease and other diseases of the circulatory system: Secondary | ICD-10-CM | POA: Diagnosis not present

## 2019-03-20 LAB — URINALYSIS, ROUTINE W REFLEX MICROSCOPIC
Bilirubin Urine: NEGATIVE
Glucose, UA: >=500 mg/dL — AB
Hgb urine dipstick: NEGATIVE
Ketones, ur: 5 mg/dL — AB
Nitrite: NEGATIVE
Protein, ur: 30 mg/dL — AB
Specific Gravity, Urine: 1.013 (ref 1.005–1.030)
pH: 7 (ref 5.0–8.0)

## 2019-03-20 LAB — GLUCOSE, CAPILLARY: Glucose-Capillary: 272 mg/dL — ABNORMAL HIGH (ref 70–99)

## 2019-03-20 MED ORDER — INSULIN ASPART 100 UNIT/ML ~~LOC~~ SOLN
4.0000 [IU] | Freq: Once | SUBCUTANEOUS | Status: AC
  Administered 2019-03-20: 4 [IU] via SUBCUTANEOUS

## 2019-03-20 NOTE — Discharge Instructions (Signed)
Braxton Hicks Contractions Contractions of the uterus can occur throughout pregnancy, but they are not always a sign that you are in labor. You may have practice contractions called Braxton Hicks contractions. These false labor contractions are sometimes confused with true labor. What are Braxton Hicks contractions? Braxton Hicks contractions are tightening movements that occur in the muscles of the uterus before labor. Unlike true labor contractions, these contractions do not result in opening (dilation) and thinning of the cervix. Toward the end of pregnancy (32-34 weeks), Braxton Hicks contractions can happen more often and may become stronger. These contractions are sometimes difficult to tell apart from true labor because they can be very uncomfortable. You should not feel embarrassed if you go to the hospital with false labor. Sometimes, the only way to tell if you are in true labor is for your health care provider to look for changes in the cervix. The health care provider will do a physical exam and may monitor your contractions. If you are not in true labor, the exam should show that your cervix is not dilating and your water has not broken. If there are no other health problems associated with your pregnancy, it is completely safe for you to be sent home with false labor. You may continue to have Braxton Hicks contractions until you go into true labor. How to tell the difference between true labor and false labor True labor  Contractions last 30-70 seconds.  Contractions become very regular.  Discomfort is usually felt in the top of the uterus, and it spreads to the lower abdomen and low back.  Contractions do not go away with walking.  Contractions usually become more intense and increase in frequency.  The cervix dilates and gets thinner. False labor  Contractions are usually shorter and not as strong as true labor contractions.  Contractions are usually irregular.  Contractions  are often felt in the front of the lower abdomen and in the groin.  Contractions may go away when you walk around or change positions while lying down.  Contractions get weaker and are shorter-lasting as time goes on.  The cervix usually does not dilate or become thin. Follow these instructions at home:   Take over-the-counter and prescription medicines only as told by your health care provider.  Keep up with your usual exercises and follow other instructions from your health care provider.  Eat and drink lightly if you think you are going into labor.  If Braxton Hicks contractions are making you uncomfortable: ? Change your position from lying down or resting to walking, or change from walking to resting. ? Sit and rest in a tub of warm water. ? Drink enough fluid to keep your urine pale yellow. Dehydration may cause these contractions. ? Do slow and deep breathing several times an hour.  Keep all follow-up prenatal visits as told by your health care provider. This is important. Contact a health care provider if:  You have a fever.  You have continuous pain in your abdomen. Get help right away if:  Your contractions become stronger, more regular, and closer together.  You have fluid leaking or gushing from your vagina.  You pass blood-tinged mucus (bloody show).  You have bleeding from your vagina.  You have low back pain that you never had before.  You feel your baby's head pushing down and causing pelvic pressure.  Your baby is not moving inside you as much as it used to. Summary  Contractions that occur before labor are   called Braxton Hicks contractions, false labor, or practice contractions.  Braxton Hicks contractions are usually shorter, weaker, farther apart, and less regular than true labor contractions. True labor contractions usually become progressively stronger and regular, and they become more frequent.  Manage discomfort from [facility] contractions  by changing position, resting in a warm bath, drinking plenty of water, or practicing deep breathing. This information is not intended to replace advice given to you by your health care provider. Make sure you discuss any questions you have with your health care provider. Document Revised: 02/01/2017 Document Reviewed: 07/05/2016 Elsevier Patient Education  [place]. Type 1 or Type 2 Diabetes Mellitus During Pregnancy, Diagnosis Type 1 diabetes (type 1 diabetes mellitus) and type 2 diabetes (type 2 diabetes mellitus) are long-term (chronic) diseases. Your diabetes may be caused by one or both of these problems:  Your pancreas does not make enough of a hormone called insulin.  Your pancreas does not respond in a normal way to insulin that it makes. Insulin lets sugars (glucose) go into cells in the body. This gives you energy. If you have diabetes, sugars cannot get into cells. This causes high blood sugar (hyperglycemia). If diabetes is treated, it may not hurt you or your baby. Your doctor will set treatment goals for you. In general, you should have these blood sugar levels:  After not eating for a long time (fasting): 95 mg/dL (5.3 mmol/L).  After meals (postprandial): ? One hour after a meal: at or below 140 mg/dL (7.8 mmol/L). ? Two hours after a meal: at or below 120 mg/dL (6.7 mmol/L).  A1c (hemoglobin A1c) level: 6-6.5%. Follow these instructions at home: Questions to ask your doctor  You may want to ask these questions: ? Do I need to meet with a diabetes educator? ? Where can I find a support group for people with diabetes? ? What equipment will I need to care for myself at home? ? What medicines do I need? When should I take them? ? How often do I need to check my blood sugar? ? What number can I call if I have questions? ? When is my next doctor's visit? General instructions  Take over-the-counter and prescription medicines only as told by your doctor.  Stay  at a healthy weight during pregnancy.  Keep all follow-up visits as told by your doctor. This is important. Contact a doctor if:  Your blood sugar is at or above 240 mg/dL (13.3 mmol/L).  Your blood sugar is at or above 200 mg/dL (11.1 mmol/L), and you have ketones in your pee (urine).  You have been sick or have had a fever for 2 days or more and you are not getting better.  You have any of these problems for more than 6 hours: ? You cannot eat or drink. ? You feel sick to your stomach (nauseous). ? You throw up (vomit). ? You have watery poop (diarrhea). Get help right away if:  Your blood sugar is lower than 54 mg/dL (3 mmol/L).  You get confused.  You have trouble: ? Thinking clearly. ? Breathing.  Your baby moves less than normal.  You have: ? Moderate or large ketone levels in your pee. ? Vaginal bleeding. ? Unusual fluid coming from your vagina. ? Early contractions. These may feel like tightness in your belly. Summary  Type 1 diabetes (type 1 diabetes mellitus) and type 2 diabetes (type 2 diabetes mellitus) are long-term (chronic) diseases.  If diabetes is treated, it may not  hurt you or your baby.  Your doctor will set treatment goals for you. This information is not intended to replace advice given to you by your health care provider. Make sure you discuss any questions you have with your health care provider. Document Revised: 06/10/2018 Document Reviewed: 03/25/2015 Elsevier Patient Education  2020 [employer].

## 2019-03-25 ENCOUNTER — Encounter: Payer: Self-pay | Admitting: Obstetrics and Gynecology

## 2019-03-25 ENCOUNTER — Ambulatory Visit (INDEPENDENT_AMBULATORY_CARE_PROVIDER_SITE_OTHER): Payer: Medicaid Other | Admitting: Obstetrics and Gynecology

## 2019-03-25 ENCOUNTER — Other Ambulatory Visit: Payer: Self-pay

## 2019-03-25 ENCOUNTER — Other Ambulatory Visit (HOSPITAL_COMMUNITY)
Admission: RE | Admit: 2019-03-25 | Discharge: 2019-03-25 | Disposition: A | Discharge status: Home / Self Care | Payer: Medicaid Other | Source: Ambulatory Visit | Attending: Obstetrics and Gynecology | Admitting: Obstetrics and Gynecology

## 2019-03-25 VITALS — BP 129/83 | HR 101 | Wt 310.9 lb

## 2019-03-25 DIAGNOSIS — O0990 Supervision of high risk pregnancy, unspecified, unspecified trimester: Secondary | ICD-10-CM

## 2019-03-25 DIAGNOSIS — O34219 Maternal care for unspecified type scar from previous cesarean delivery: Secondary | ICD-10-CM

## 2019-03-25 DIAGNOSIS — O24113 Pre-existing diabetes mellitus, type 2, in pregnancy, third trimester: Secondary | ICD-10-CM | POA: Diagnosis not present

## 2019-03-25 DIAGNOSIS — O133 Gestational [pregnancy-induced] hypertension without significant proteinuria, third trimester: Secondary | ICD-10-CM | POA: Diagnosis not present

## 2019-03-25 DIAGNOSIS — O0993 Supervision of high risk pregnancy, unspecified, third trimester: Secondary | ICD-10-CM

## 2019-03-25 DIAGNOSIS — O24119 Pre-existing diabetes mellitus, type 2, in pregnancy, unspecified trimester: Secondary | ICD-10-CM

## 2019-03-25 DIAGNOSIS — Z3A35 35 weeks gestation of pregnancy: Secondary | ICD-10-CM

## 2019-03-25 NOTE — Progress Notes (Signed)
   PRENATAL VISIT NOTE  Subjective:  Ellen Martin is a 34 y.o. [id number] at 108w5d being seen today for ongoing prenatal care.  She is currently monitored for the following issues for this high-risk pregnancy and has Gestational hypertension; Pre-existing type 2 diabetes affecting pregnancy, antepartum; Gallstone; Previous cesarean delivery, antepartum; Adjustment disorder with mixed disturbance of emotions and conduct; Suicide attempt [facility]); MDD (major depressive disorder), single episode, moderate (HCC); Suicidal ideations; Nausea and vomiting of pregnancy, antepartum; and Supervision of high risk pregnancy, antepartum on their problem list.  Patient reports no complaints.  Contractions: Not present. Vag. Bleeding: None.  Movement: Present. Denies leaking of fluid.   The following portions of the patient's history were reviewed and updated as appropriate: allergies, current medications, past family history, past medical history, past social history, past surgical history and problem list.   Objective:   Vitals:   03/25/19 1548  BP: 129/83  Pulse: (!) 101  Weight: (!) 310 lb 14.4 oz (141 kg)    Fetal Status: Fetal Heart Rate (bpm): 148 Fundal Height: 42 cm Movement: Present     General:  Alert, oriented and cooperative. Patient is in no acute distress.  Skin: Skin is warm and dry. No rash noted.   Cardiovascular: Normal heart rate noted  Respiratory: Normal respiratory effort, no problems with respiration noted  Abdomen: Soft, gravid, appropriate for gestational age.  Pain/Pressure: Absent     Pelvic: Cervical exam performed Dilation: Closed Effacement (%): Thick Station: Ballotable  Extremities: Normal range of motion.     Mental Status: Normal mood and affect. Normal behavior. Normal judgment and thought content.   Assessment and Plan:  Pregnancy: [id number] at [redacted]w[redacted]d 1. Supervision of high risk pregnancy, antepartum Patient is without complaints Patient desires BTL for contraception-  consent signed today. Patient understand that this will likely be an interval procedure Cultures today - Strep Gp B NAA - Cervicovaginal ancillary only( [place])  2. Pre-existing type 2 diabetes affecting pregnancy, antepartum Patient is not on any medication She was unable to check CBGs due to an inability to obtain lancets. She plans to pick them up today Follow up growth ultrasound 1/28  3. Gestational hypertension, third trimester Patient diagnosed with GHTN at 27 weeks Will plan for IOL at 37 weeks  4. Previous cesarean delivery, antepartum Patient desires TOLAC   Preterm labor symptoms and general obstetric precautions including but not limited to vaginal bleeding, contractions, leaking of fluid and fetal movement were reviewed in detail with the patient. Please refer to After Visit Summary for other counseling recommendations.   Return in about 1 week (around 04/01/2019) for Virtual, ROB, High risk.  Future Appointments  Date Time Provider Department Center  04/02/2019  1:30 AM [facility] MD RM [facility]  04/02/2019 12:30 PM [facility] NURSE [facility]  04/02/2019 12:30 PM [facility] [place] [facility]    Catalina Antigua, MD

## 2019-03-25 NOTE — Progress Notes (Signed)
Patient reports fetal movement, denies pain. Pt states that she has not been checking her BG because there was a mix up at pharmacy with lancets and she has not picked them up yet.

## 2019-03-27 LAB — CERVICOVAGINAL ANCILLARY ONLY
Chlamydia: NEGATIVE
Comment: NEGATIVE
Comment: NORMAL
Neisseria Gonorrhea: NEGATIVE

## 2019-03-27 LAB — STREP GP B NAA: Strep Gp B NAA: POSITIVE — AB

## 2019-03-30 ENCOUNTER — Encounter: Payer: Self-pay | Admitting: Obstetrics and Gynecology

## 2019-03-30 DIAGNOSIS — O99820 Streptococcus B carrier state complicating pregnancy: Secondary | ICD-10-CM | POA: Insufficient documentation

## 2019-04-01 ENCOUNTER — Telehealth: Payer: Medicaid Other | Admitting: Obstetrics and Gynecology

## 2019-04-01 NOTE — Progress Notes (Signed)
Pt did not answer for her virtual OB visit, she will be rescheduled.  Baldemar Lenis, M.D. Attending Center for [employer] [facility])

## 2019-04-02 ENCOUNTER — Ambulatory Visit (HOSPITAL_COMMUNITY): Payer: Medicaid Other

## 2019-04-02 ENCOUNTER — Encounter (HOSPITAL_COMMUNITY): Payer: Medicaid Other

## 2019-04-02 ENCOUNTER — Ambulatory Visit (HOSPITAL_COMMUNITY): Payer: Medicaid Other | Attending: Obstetrics and Gynecology

## 2019-04-02 ENCOUNTER — Ambulatory Visit (HOSPITAL_COMMUNITY)
Admission: RE | Admit: 2019-04-02 | Payer: Medicaid Other | Source: Ambulatory Visit | Attending: Obstetrics & Gynecology | Admitting: Obstetrics & Gynecology

## 2019-04-08 ENCOUNTER — Telehealth (HOSPITAL_COMMUNITY): Payer: Self-pay | Admitting: *Deleted

## 2019-04-08 NOTE — Telephone Encounter (Signed)
Preadmission screen  

## 2019-04-12 ENCOUNTER — Inpatient Hospital Stay (HOSPITAL_COMMUNITY): Payer: Medicaid Other | Admitting: Anesthesiology

## 2019-04-12 ENCOUNTER — Encounter (HOSPITAL_COMMUNITY)
Admission: AD | Disposition: A | Discharge status: Home / Self Care | Payer: Self-pay | Source: Home / Self Care | Attending: Obstetrics and Gynecology

## 2019-04-12 ENCOUNTER — Encounter (HOSPITAL_COMMUNITY): Payer: Self-pay | Admitting: Anesthesiology

## 2019-04-12 ENCOUNTER — Other Ambulatory Visit: Payer: Self-pay

## 2019-04-12 ENCOUNTER — Inpatient Hospital Stay (HOSPITAL_COMMUNITY)
Admission: AD | Admit: 2019-04-12 | Discharge: 2019-04-15 | DRG: 783 | Disposition: A | Discharge status: Home / Self Care | Payer: Medicaid Other | Attending: Obstetrics and Gynecology | Admitting: Obstetrics and Gynecology

## 2019-04-12 DIAGNOSIS — O99344 Other mental disorders complicating childbirth: Secondary | ICD-10-CM | POA: Diagnosis present

## 2019-04-12 DIAGNOSIS — O99824 Streptococcus B carrier state complicating childbirth: Secondary | ICD-10-CM | POA: Diagnosis not present

## 2019-04-12 DIAGNOSIS — Z3A38 38 weeks gestation of pregnancy: Secondary | ICD-10-CM | POA: Diagnosis not present

## 2019-04-12 DIAGNOSIS — Z302 Encounter for sterilization: Secondary | ICD-10-CM | POA: Diagnosis not present

## 2019-04-12 DIAGNOSIS — Z794 Long term (current) use of insulin: Secondary | ICD-10-CM | POA: Diagnosis not present

## 2019-04-12 DIAGNOSIS — O34219 Maternal care for unspecified type scar from previous cesarean delivery: Secondary | ICD-10-CM | POA: Diagnosis not present

## 2019-04-12 DIAGNOSIS — T363X5A Adverse effect of macrolides, initial encounter: Secondary | ICD-10-CM | POA: Diagnosis not present

## 2019-04-12 DIAGNOSIS — O134 Gestational [pregnancy-induced] hypertension without significant proteinuria, complicating childbirth: Secondary | ICD-10-CM | POA: Diagnosis not present

## 2019-04-12 DIAGNOSIS — O34211 Maternal care for low transverse scar from previous cesarean delivery: Principal | ICD-10-CM | POA: Diagnosis present

## 2019-04-12 DIAGNOSIS — Y92230 Patient room in hospital as the place of occurrence of the external cause: Secondary | ICD-10-CM | POA: Diagnosis not present

## 2019-04-12 DIAGNOSIS — Z98891 History of uterine scar from previous surgery: Secondary | ICD-10-CM

## 2019-04-12 DIAGNOSIS — E119 Type 2 diabetes mellitus without complications: Secondary | ICD-10-CM | POA: Diagnosis present

## 2019-04-12 DIAGNOSIS — O99334 Smoking (tobacco) complicating childbirth: Secondary | ICD-10-CM | POA: Diagnosis not present

## 2019-04-12 DIAGNOSIS — O24119 Pre-existing diabetes mellitus, type 2, in pregnancy, unspecified trimester: Secondary | ICD-10-CM | POA: Diagnosis present

## 2019-04-12 DIAGNOSIS — Z9851 Tubal ligation status: Secondary | ICD-10-CM

## 2019-04-12 DIAGNOSIS — O26893 Other specified pregnancy related conditions, third trimester: Secondary | ICD-10-CM | POA: Diagnosis not present

## 2019-04-12 DIAGNOSIS — O99214 Obesity complicating childbirth: Secondary | ICD-10-CM | POA: Diagnosis not present

## 2019-04-12 DIAGNOSIS — Z20822 Contact with and (suspected) exposure to covid-19: Secondary | ICD-10-CM | POA: Diagnosis not present

## 2019-04-12 DIAGNOSIS — F17210 Nicotine dependence, cigarettes, uncomplicated: Secondary | ICD-10-CM | POA: Diagnosis present

## 2019-04-12 DIAGNOSIS — Z23 Encounter for immunization: Secondary | ICD-10-CM | POA: Diagnosis not present

## 2019-04-12 DIAGNOSIS — O0990 Supervision of high risk pregnancy, unspecified, unspecified trimester: Secondary | ICD-10-CM

## 2019-04-12 DIAGNOSIS — F4325 Adjustment disorder with mixed disturbance of emotions and conduct: Secondary | ICD-10-CM | POA: Diagnosis not present

## 2019-04-12 DIAGNOSIS — O2412 Pre-existing diabetes mellitus, type 2, in childbirth: Secondary | ICD-10-CM | POA: Diagnosis present

## 2019-04-12 DIAGNOSIS — O9A23 Injury, poisoning and certain other consequences of external causes complicating the puerperium: Secondary | ICD-10-CM | POA: Diagnosis not present

## 2019-04-12 DIAGNOSIS — T1491XA Suicide attempt, initial encounter: Secondary | ICD-10-CM | POA: Diagnosis present

## 2019-04-12 DIAGNOSIS — O99820 Streptococcus B carrier state complicating pregnancy: Secondary | ICD-10-CM

## 2019-04-12 DIAGNOSIS — O139 Gestational [pregnancy-induced] hypertension without significant proteinuria, unspecified trimester: Secondary | ICD-10-CM | POA: Diagnosis present

## 2019-04-12 LAB — CBC
HCT: 38.9 % (ref 36.0–46.0)
Hemoglobin: 12.8 g/dL (ref 12.0–15.0)
MCH: 26.1 pg (ref 26.0–34.0)
MCHC: 32.9 g/dL (ref 30.0–36.0)
MCV: 79.2 fL — ABNORMAL LOW (ref 80.0–100.0)
Platelets: 253 10*3/uL (ref 150–400)
RBC: 4.91 MIL/uL (ref 3.87–5.11)
RDW: 13.6 % (ref 11.5–15.5)
WBC: 11.9 10*3/uL — ABNORMAL HIGH (ref 4.0–10.5)
nRBC: 0 % (ref 0.0–0.2)

## 2019-04-12 LAB — TYPE AND SCREEN
ABO/RH(D): O POS
Antibody Screen: NEGATIVE

## 2019-04-12 LAB — RESPIRATORY PANEL BY RT PCR (FLU A&B, COVID)
Influenza A by PCR: NEGATIVE
Influenza B by PCR: NEGATIVE
SARS Coronavirus 2 by RT PCR: NEGATIVE

## 2019-04-12 LAB — GLUCOSE, CAPILLARY
Glucose-Capillary: 156 mg/dL — ABNORMAL HIGH (ref 70–99)
Glucose-Capillary: 176 mg/dL — ABNORMAL HIGH (ref 70–99)

## 2019-04-12 SURGERY — Surgical Case
Anesthesia: Spinal | Wound class: Clean Contaminated

## 2019-04-12 MED ORDER — SODIUM CHLORIDE 0.9 % IV SOLN: 500.0000 mg | INTRAVENOUS | Status: DC

## 2019-04-12 MED ORDER — MORPHINE SULFATE (PF) 0.5 MG/ML IJ SOLN
INTRAMUSCULAR | Status: DC | PRN
  Administered 2019-04-12: .15 mg via INTRATHECAL

## 2019-04-12 MED ORDER — LACTATED RINGERS IV SOLN: INTRAVENOUS | Status: DC | PRN

## 2019-04-12 MED ORDER — DEXTROSE 5 % IV SOLN
INTRAVENOUS | Status: AC
  Filled 2019-04-12: qty 3000

## 2019-04-12 MED ORDER — SODIUM CHLORIDE 0.9 % IV SOLN: INTRAVENOUS | Status: DC | PRN

## 2019-04-12 MED ORDER — DIPHENHYDRAMINE HCL 50 MG/ML IJ SOLN
INTRAMUSCULAR | Status: AC
  Filled 2019-04-12: qty 1

## 2019-04-12 MED ORDER — FENTANYL CITRATE (PF) 100 MCG/2ML IJ SOLN: 50.0000 ug | INTRAMUSCULAR | Status: DC | PRN

## 2019-04-12 MED ORDER — NALBUPHINE HCL 10 MG/ML IJ SOLN: 5.0000 mg | INTRAMUSCULAR | Status: DC | PRN

## 2019-04-12 MED ORDER — OXYTOCIN 40 UNITS IN NORMAL SALINE INFUSION - SIMPLE MED: INTRAVENOUS | Status: DC | PRN

## 2019-04-12 MED ORDER — KETOROLAC TROMETHAMINE 30 MG/ML IJ SOLN
INTRAMUSCULAR | Status: AC
  Filled 2019-04-12: qty 1

## 2019-04-12 MED ORDER — SODIUM CHLORIDE 0.9 % IV SOLN
INTRAVENOUS | Status: DC | PRN
  Administered 2019-04-12: 500 mg via INTRAVENOUS

## 2019-04-12 MED ORDER — SODIUM CHLORIDE 0.9 % IR SOLN
Status: DC | PRN
  Administered 2019-04-12: 1000 mL

## 2019-04-12 MED ORDER — LACTATED RINGERS IV SOLN: 500.0000 mL | INTRAVENOUS | Status: DC | PRN

## 2019-04-12 MED ORDER — OXYTOCIN 40 UNITS IN NORMAL SALINE INFUSION - SIMPLE MED
INTRAVENOUS | Status: AC
  Filled 2019-04-12: qty 1000

## 2019-04-12 MED ORDER — PHENYLEPHRINE HCL-NACL 20-0.9 MG/250ML-% IV SOLN
INTRAVENOUS | Status: AC
  Filled 2019-04-12: qty 250

## 2019-04-12 MED ORDER — PHENYLEPHRINE 40 MCG/ML (10ML) SYRINGE FOR IV PUSH (FOR BLOOD PRESSURE SUPPORT)
PREFILLED_SYRINGE | INTRAVENOUS | Status: AC
  Filled 2019-04-12: qty 10

## 2019-04-12 MED ORDER — ACETAMINOPHEN 325 MG PO TABS: 650.0000 mg | ORAL_TABLET | ORAL | Status: DC | PRN

## 2019-04-12 MED ORDER — MEPERIDINE HCL 25 MG/ML IJ SOLN: 6.2500 mg | INTRAMUSCULAR | Status: DC | PRN

## 2019-04-12 MED ORDER — NALBUPHINE HCL 10 MG/ML IJ SOLN: 5.0000 mg | Freq: Once | INTRAMUSCULAR | Status: DC | PRN

## 2019-04-12 MED ORDER — PHENYLEPHRINE HCL (PRESSORS) 10 MG/ML IV SOLN
INTRAVENOUS | Status: DC | PRN
  Administered 2019-04-12 (×6): 80 ug via INTRAVENOUS

## 2019-04-12 MED ORDER — DEXTROSE 5 % IV SOLN
3.0000 g | INTRAVENOUS | Status: AC
  Administered 2019-04-12: 22:00:00 3 g via INTRAVENOUS
  Filled 2019-04-12: qty 3000

## 2019-04-12 MED ORDER — SODIUM CHLORIDE 0.9 % IV SOLN
2.0000 g | Freq: Once | INTRAVENOUS | Status: AC
  Administered 2019-04-12: 22:00:00 2 g via INTRAVENOUS
  Filled 2019-04-12: qty 2000

## 2019-04-12 MED ORDER — FENTANYL CITRATE (PF) 100 MCG/2ML IJ SOLN
INTRAMUSCULAR | Status: DC | PRN
  Administered 2019-04-12: 15 ug via INTRATHECAL

## 2019-04-12 MED ORDER — SOD CITRATE-CITRIC ACID 500-334 MG/5ML PO SOLN: 30.0000 mL | ORAL | Status: DC

## 2019-04-12 MED ORDER — OXYTOCIN 40 UNITS IN NORMAL SALINE INFUSION - SIMPLE MED
INTRAVENOUS | Status: DC | PRN
  Administered 2019-04-12: 500 mL via INTRAVENOUS

## 2019-04-12 MED ORDER — LACTATED RINGERS IV SOLN: INTRAVENOUS | Status: DC

## 2019-04-12 MED ORDER — ONDANSETRON HCL 4 MG/2ML IJ SOLN: 4.0000 mg | Freq: Three times a day (TID) | INTRAMUSCULAR | Status: DC | PRN

## 2019-04-12 MED ORDER — DIPHENHYDRAMINE HCL 50 MG/ML IJ SOLN
INTRAMUSCULAR | Status: DC | PRN
  Administered 2019-04-12: 12.5 mg via INTRAVENOUS

## 2019-04-12 MED ORDER — NALOXONE HCL 4 MG/10ML IJ SOLN
1.0000 ug/kg/h | INTRAVENOUS | Status: DC | PRN
  Filled 2019-04-12: qty 5

## 2019-04-12 MED ORDER — PHENYLEPHRINE HCL-NACL 10-0.9 MG/250ML-% IV SOLN
INTRAVENOUS | Status: DC | PRN
  Administered 2019-04-12: 60 ug/min via INTRAVENOUS

## 2019-04-12 MED ORDER — FENTANYL CITRATE (PF) 100 MCG/2ML IJ SOLN
INTRAMUSCULAR | Status: AC
  Filled 2019-04-12: qty 2

## 2019-04-12 MED ORDER — SOD CITRATE-CITRIC ACID 500-334 MG/5ML PO SOLN
30.0000 mL | ORAL | Status: DC | PRN
  Administered 2019-04-12: 22:00:00 30 mL via ORAL
  Filled 2019-04-12: qty 30

## 2019-04-12 MED ORDER — OXYTOCIN BOLUS FROM INFUSION: 500.0000 mL | Freq: Once | INTRAVENOUS | Status: DC

## 2019-04-12 MED ORDER — TRANEXAMIC ACID 1000 MG/10ML IV SOLN: INTRAVENOUS | Status: DC | PRN

## 2019-04-12 MED ORDER — ONDANSETRON HCL 4 MG/2ML IJ SOLN: 4.0000 mg | Freq: Four times a day (QID) | INTRAMUSCULAR | Status: DC | PRN

## 2019-04-12 MED ORDER — NALOXONE HCL 0.4 MG/ML IJ SOLN: 0.4000 mg | INTRAMUSCULAR | Status: DC | PRN

## 2019-04-12 MED ORDER — LIDOCAINE HCL (PF) 1 % IJ SOLN: 30.0000 mL | INTRAMUSCULAR | Status: DC | PRN

## 2019-04-12 MED ORDER — DEXAMETHASONE SODIUM PHOSPHATE 10 MG/ML IJ SOLN
INTRAMUSCULAR | Status: DC | PRN
  Administered 2019-04-12: 10 mg via INTRAVENOUS

## 2019-04-12 MED ORDER — MORPHINE SULFATE (PF) 0.5 MG/ML IJ SOLN
INTRAMUSCULAR | Status: AC
  Filled 2019-04-12: qty 10

## 2019-04-12 MED ORDER — BUPIVACAINE IN DEXTROSE 0.75-8.25 % IT SOLN
INTRATHECAL | Status: DC | PRN
  Administered 2019-04-12: 1.8 mL via INTRATHECAL

## 2019-04-12 MED ORDER — ONDANSETRON HCL 4 MG/2ML IJ SOLN
INTRAMUSCULAR | Status: DC | PRN
  Administered 2019-04-12: 4 mg via INTRAVENOUS

## 2019-04-12 MED ORDER — FENTANYL CITRATE (PF) 100 MCG/2ML IJ SOLN: 25.0000 ug | INTRAMUSCULAR | Status: DC | PRN

## 2019-04-12 MED ORDER — TRANEXAMIC ACID 1000 MG/10ML IV SOLN
INTRAVENOUS | Status: DC | PRN
  Administered 2019-04-12: 1000 mg via INTRAVENOUS

## 2019-04-12 MED ORDER — MIDAZOLAM HCL 2 MG/2ML IJ SOLN
INTRAMUSCULAR | Status: AC
  Filled 2019-04-12: qty 2

## 2019-04-12 MED ORDER — KETOROLAC TROMETHAMINE 30 MG/ML IJ SOLN
30.0000 mg | Freq: Four times a day (QID) | INTRAMUSCULAR | Status: AC | PRN
  Administered 2019-04-12: 30 mg via INTRAVENOUS

## 2019-04-12 MED ORDER — DIPHENHYDRAMINE HCL 50 MG/ML IJ SOLN: 12.5000 mg | INTRAMUSCULAR | Status: DC | PRN

## 2019-04-12 MED ORDER — MIDAZOLAM HCL 5 MG/5ML IJ SOLN
INTRAMUSCULAR | Status: DC | PRN
  Administered 2019-04-12: 2 mg via INTRAVENOUS

## 2019-04-12 MED ORDER — KETOROLAC TROMETHAMINE 30 MG/ML IJ SOLN: 30.0000 mg | Freq: Four times a day (QID) | INTRAMUSCULAR | Status: AC | PRN

## 2019-04-12 MED ORDER — DEXAMETHASONE SODIUM PHOSPHATE 10 MG/ML IJ SOLN
INTRAMUSCULAR | Status: AC
  Filled 2019-04-12: qty 1

## 2019-04-12 MED ORDER — SODIUM CHLORIDE 0.9% FLUSH: 3.0000 mL | INTRAVENOUS | Status: DC | PRN

## 2019-04-12 MED ORDER — OXYTOCIN 40 UNITS IN NORMAL SALINE INFUSION - SIMPLE MED: 2.5000 [IU]/h | INTRAVENOUS | Status: DC

## 2019-04-12 MED ORDER — TRANEXAMIC ACID-NACL 1000-0.7 MG/100ML-% IV SOLN
INTRAVENOUS | Status: AC
  Filled 2019-04-12: qty 100

## 2019-04-12 MED ORDER — SODIUM CHLORIDE 0.9 % IV SOLN
INTRAVENOUS | Status: AC
  Filled 2019-04-12: qty 500

## 2019-04-12 MED ORDER — ONDANSETRON HCL 4 MG/2ML IJ SOLN
INTRAMUSCULAR | Status: AC
  Filled 2019-04-12: qty 2

## 2019-04-12 MED ORDER — DIPHENHYDRAMINE HCL 25 MG PO CAPS: 25.0000 mg | ORAL_CAPSULE | ORAL | Status: DC | PRN

## 2019-04-12 SURGICAL SUPPLY — 49 items
BENZOIN TINCTURE PRP APPL 2/3 (GAUZE/BANDAGES/DRESSINGS) ×3 IMPLANT
CELLS DAT CNTRL 66122 CELL SVR (MISCELLANEOUS) ×1 IMPLANT
CHLORAPREP W/TINT 26ML (MISCELLANEOUS) ×3 IMPLANT
CLAMP CORD UMBIL (MISCELLANEOUS) IMPLANT
CLIP FILSHIE TUBAL LIGA STRL (Clip) ×3 IMPLANT
CLOSURE WOUND 1/2 X4 (GAUZE/BANDAGES/DRESSINGS) ×1
CLOTH BEACON ORANGE TIMEOUT ST (SAFETY) ×3 IMPLANT
DRAPE C SECTION CLR SCREEN (DRAPES) IMPLANT
DRESSING PREVENA PLUS CUSTOM (GAUZE/BANDAGES/DRESSINGS) ×1 IMPLANT
DRSG OPSITE POSTOP 4X10 (GAUZE/BANDAGES/DRESSINGS) ×3 IMPLANT
DRSG PREVENA PLUS CUSTOM (GAUZE/BANDAGES/DRESSINGS) ×3
ELECT REM PT RETURN 9FT ADLT (ELECTROSURGICAL) ×3
ELECTRODE REM PT RTRN 9FT ADLT (ELECTROSURGICAL) ×1 IMPLANT
EXTENDER TRAXI PANNICULUS (MISCELLANEOUS) ×1 IMPLANT
EXTRACTOR VACUUM M CUP 4 TUBE (SUCTIONS) IMPLANT
EXTRACTOR VACUUM M CUP 4' TUBE (SUCTIONS)
GLOVE BIO SURGEON STRL SZ7.5 (GLOVE) ×3 IMPLANT
GLOVE BIOGEL PI IND STRL 7.0 (GLOVE) ×1 IMPLANT
GLOVE BIOGEL PI INDICATOR 7.0 (GLOVE) ×2
GOWN STRL REUS W/TWL 2XL LVL3 (GOWN DISPOSABLE) ×3 IMPLANT
GOWN STRL REUS W/TWL LRG LVL3 (GOWN DISPOSABLE) ×6 IMPLANT
HOVERMATT SINGLE USE (MISCELLANEOUS) ×3 IMPLANT
KIT ABG SYR 3ML LUER SLIP (SYRINGE) IMPLANT
NEEDLE HYPO 22GX1.5 SAFETY (NEEDLE) ×3 IMPLANT
NEEDLE HYPO 25X5/8 SAFETYGLIDE (NEEDLE) IMPLANT
NS IRRIG 1000ML POUR BTL (IV SOLUTION) ×3 IMPLANT
PACK C SECTION WH (CUSTOM PROCEDURE TRAY) ×3 IMPLANT
PAD OB MATERNITY 4.3X12.25 (PERSONAL CARE ITEMS) ×3 IMPLANT
PENCIL SMOKE EVAC W/HOLSTER (ELECTROSURGICAL) ×3 IMPLANT
RETRACTOR TRAXI PANNICULUS (MISCELLANEOUS) ×1 IMPLANT
RTRCTR C-SECT PINK 25CM LRG (MISCELLANEOUS) ×3 IMPLANT
RTRCTR WOUND ALEXIS 18CM MED (MISCELLANEOUS) ×3
STRIP CLOSURE SKIN 1/2X4 (GAUZE/BANDAGES/DRESSINGS) ×2 IMPLANT
SUT CHROMIC 1 CTX 36 (SUTURE) ×6 IMPLANT
SUT VIC AB 1 CT1 36 (SUTURE) ×6 IMPLANT
SUT VIC AB 2-0 CT1 (SUTURE) ×3 IMPLANT
SUT VIC AB 2-0 CT1 27 (SUTURE) ×2
SUT VIC AB 2-0 CT1 TAPERPNT 27 (SUTURE) ×1 IMPLANT
SUT VIC AB 3-0 CT1 27 (SUTURE) ×4
SUT VIC AB 3-0 CT1 TAPERPNT 27 (SUTURE) ×2 IMPLANT
SUT VIC AB 3-0 SH 27 (SUTURE)
SUT VIC AB 3-0 SH 27X BRD (SUTURE) IMPLANT
SUT VIC AB 4-0 KS 27 (SUTURE) ×3 IMPLANT
SYR BULB IRRIGATION 50ML (SYRINGE) IMPLANT
TOWEL OR 17X24 6PK STRL BLUE (TOWEL DISPOSABLE) ×3 IMPLANT
TRAXI PANNICULUS EXTENDER (MISCELLANEOUS) ×2
TRAXI PANNICULUS RETRACTOR (MISCELLANEOUS) ×2
TRAY FOLEY W/BAG SLVR 14FR LF (SET/KITS/TRAYS/PACK) ×3 IMPLANT
WATER STERILE IRR 1000ML POUR (IV SOLUTION) ×3 IMPLANT

## 2019-04-12 NOTE — Anesthesia Preprocedure Evaluation (Signed)
Anesthesia Evaluation  Patient identified by MRN, date of birth, ID band Patient awake    Reviewed: Allergy & Precautions, NPO status , Patient's Chart, lab work & pertinent test results  Airway Mallampati: III  TM Distance: >3 FB Neck ROM: Full    Dental no notable dental hx. (+) Teeth Intact   Pulmonary Current SmokerPatient did not abstain from smoking.,    Pulmonary exam normal breath sounds clear to auscultation       Cardiovascular hypertension, Normal cardiovascular exam Rhythm:Regular Rate:Normal     Neuro/Psych PSYCHIATRIC DISORDERS Anxiety Depression negative neurological ROS     GI/Hepatic GERD  Medicated,(+)     substance abuse  marijuana use,   Endo/Other  diabetes, Poorly Controlled, GestationalMorbid obesity  Renal/GU negative Renal ROS  negative genitourinary   Musculoskeletal negative musculoskeletal ROS (+)   Abdominal (+) + obese,   Peds  Hematology   Anesthesia Other Findings   Reproductive/Obstetrics (+) Pregnancy Previous C/section                             Anesthesia Physical  Anesthesia Plan  ASA: III and emergent  Anesthesia Plan: Spinal   Post-op Pain Management:    Induction:   PONV Risk Score and Plan: 3 and Scopolamine patch - Pre-op, Ondansetron and Treatment may vary due to age or medical condition  Airway Management Planned: Natural Airway  Additional Equipment:   Intra-op Plan:   Post-operative Plan:   Informed Consent: I have reviewed the patients History and Physical, chart, labs and discussed the procedure including the risks, benefits and alternatives for the proposed anesthesia with the patient or authorized representative who has indicated his/her understanding and acceptance.     Dental advisory given  Plan Discussed with: CRNA, Surgeon and Anesthesiologist  Anesthesia Plan Comments:         Anesthesia Quick  Evaluation

## 2019-04-12 NOTE — H&P (Signed)
LABOR AND DELIVERY ADMISSION HISTORY AND PHYSICAL NOTE  Ellen Martin is a 34 y.o. female [phone] with IUP at [redacted]w[redacted]d by patient report presenting for active labor at 6 cm.   Patient has prior hx of cesarean for twins w malpresentation 03/2018 Also reports hx of 12lb vaginal delivery w likely dystocia "they broke the babies clavicle to get it out Has been seen twice in clinic, scant prenatal care  She reports positive fetal movement. She denies leakage of fluid or vaginal bleeding.   She requests bilateral tubal ligation for birth control.  Prenatal History/Complications: PNC at [facility]  No prenatal ultrasounds available Reports scan at [place], female infant  Pregnancy complications:  - A5WU - gHTN  Past Medical History: Past Medical History:  Diagnosis Date  . Anxiety   . Blood transfusion without reported diagnosis   . Depression   . Diabetes mellitus without complication (HCC)    Type 2  . Hypertension     Past Surgical History: Past Surgical History:  Procedure Laterality Date  . CESAREAN SECTION    . CESAREAN SECTION MULTI-GESTATIONAL N/A 03/07/2018   Procedure: CESAREAN SECTION MULTI-GESTATIONAL;  Surgeon: Sloan Leiter, MD;  Location: [place];  Service: Obstetrics;  Laterality: N/A;  . NO PAST SURGERIES      Obstetrical History: OB History    Gravida  8   Para  4   Term  4   Preterm      AB  3   Living  5     SAB  3   TAB      Ectopic      Multiple  1   Live Births  5           Social History: Social History   Socioeconomic History  . Marital status: Single    Spouse name: Not on file  . Number of children: Not on file  . Years of education: Not on file  . Highest education level: Not on file  Occupational History  . Occupation: unemployed  Tobacco Use  . Smoking status: Current Every Day Smoker    Packs/day: 0.25    Types: Cigarettes  . Smokeless tobacco: Never Used  . Tobacco comment: 1-2 per day   Substance and Sexual Activity  . Alcohol use: Not Currently  . Drug use: Not Currently    Types: Marijuana    Comment: "a while ago"  . Sexual activity: Yes  Other Topics Concern  . Not on file  Social History Narrative  . Not on file   Social Determinants of Health   Financial Resource Strain:   . Difficulty of Paying Living Expenses: Not on file  Food Insecurity:   . Worried About Charity fundraiser in the Last Year: Not on file  . Ran Out of Food in the Last Year: Not on file  Transportation Needs:   . Lack of Transportation (Medical): Not on file  . Lack of Transportation (Non-Medical): Not on file  Physical Activity:   . Days of Exercise per Week: Not on file  . Minutes of Exercise per Session: Not on file  Stress:   . Feeling of Stress : Not on file  Social Connections:   . Frequency of Communication with Friends and Family: Not on file  . Frequency of Social Gatherings with Friends and Family: Not on file  . Attends Religious Services: Not on file  . Active Member of Clubs or Organizations: Not on file  .  Attends Banker Meetings: Not on file  . Marital Status: Not on file    Family History: Family History  Problem Relation Age of Onset  . Hypertension Mother   . Diabetes Mother   . Arthritis Mother   . Obesity Mother   . Hypertension Maternal Grandmother   . Diabetes Maternal Grandmother   . Arthritis Maternal Grandmother   . COPD Maternal Uncle   . Miscarriages / Stillbirths Other     Allergies: No Known Allergies  Medications Prior to Admission  Medication Sig Dispense Refill Last Dose  . hydrOXYzine (ATARAX/VISTARIL) 25 MG tablet Take 1 tablet (25 mg total) by mouth every 6 (six) hours as needed for anxiety. (Patient not taking: Reported on 03/18/2019) 30 tablet 0   . metFORMIN (GLUCOPHAGE) 500 MG tablet Take 1 tablet (500 mg total) by mouth 2 (two) times daily with a meal. For diabetes (Patient not taking: Reported on 05/20/2018) 60  tablet 3   . pantoprazole (PROTONIX) 40 MG tablet Take 1 tablet (40 mg total) by mouth daily. (Patient not taking: Reported on 03/25/2019) 30 tablet 1   . scopolamine (TRANSDERM-SCOP) 1 MG/3DAYS UNWRAP AND APPLY 1 PATCH TO SKIN EVERY 3 DAYS (Patient not taking: Reported on 03/25/2019) 4 patch 0   . traZODone (DESYREL) 50 MG tablet Take 1 tablet (50 mg total) by mouth at bedtime as needed for sleep. (Patient not taking: Reported on 03/18/2019) 30 tablet 0      Review of Systems  All systems reviewed and negative except as stated in HPI  Physical Exam Blood pressure (!) 138/97, pulse (!) 124, temperature 98.2 F (36.8 C), temperature source Oral, resp. rate (!) 22, last menstrual period 07/18/2018, not currently breastfeeding. General appearance: alert, oriented, NAD Lungs: normal respiratory effort Heart: regular rate Abdomen: soft, non-tender; gravid, unable to leopolds due to habitus Extremities: No calf swelling or tenderness FHR: baseline 165, moderate variability, no accels, no decels, limited tracing  Prenatal labs: ABO, Rh: --/--/PENDING (02/07 2119) Antibody: PENDING (02/07 2119) Rubella: Immune (08/25 0000) RPR: Nonreactive (08/25 0000)  HBsAg: Negative (08/25 0000)  HIV: Non-reactive (08/25 0000)  GC/Chlamydia: neg/neg  GBS: --Lottie Dawson (01/20 0453)  2-hr GTT: not done, known T2DM Genetic screening:  No result available Anatomy US: result not available  Prenatal Transfer Tool  Maternal Diabetes: Yes:  Diabetes Type:  Insulin/Medication controlled Genetic Screening: not done Maternal Ultrasounds/Referrals: not done Fetal Ultrasounds or other Referrals:  Not done Maternal Substance Abuse:  No Significant Maternal Medications:  None Significant Maternal Lab Results: Group B Strep positive  Results for orders placed or performed during the hospital encounter of 04/12/19 (from the past 24 hour(s))  CBC   Collection Time: 04/12/19  9:19 PM  Result Value Ref Range   WBC  PENDING 4.0 - 10.5 K/uL   RBC PENDING 3.87 - 5.11 MIL/uL   Hemoglobin PENDING 12.0 - 15.0 g/dL   HCT PENDING 75.1 - 70.0 %   MCV PENDING 80.0 - 100.0 fL   MCH PENDING 26.0 - 34.0 pg   MCHC PENDING 30.0 - 36.0 g/dL   RDW PENDING 17.4 - 94.4 %   Platelets 253 150 - 400 K/uL   nRBC PENDING 0.0 - 0.2 %  Type and screen [facility]   Collection Time: 04/12/19  9:19 PM  Result Value Ref Range   ABO/RH(D) PENDING    Antibody Screen PENDING    Sample Expiration      04/15/2019,2359 Performed at [facility]  Lab, [address], [place], [place] [address]   Glucose, capillary   Collection Time: 04/12/19  9:31 PM  Result Value Ref Range   Glucose-Capillary 156 (H) 70 - 99 mg/dL    Patient Active Problem List   Diagnosis Date Noted  . Normal labor 04/12/2019  . GBS (group B Streptococcus carrier), +RV culture, currently pregnant 03/30/2019  . Supervision of high risk pregnancy, antepartum 03/18/2019  . Nausea and vomiting of pregnancy, antepartum 01/29/2019  . Suicidal ideations   . Adjustment disorder with mixed disturbance of emotions and conduct 05/21/2018  . MDD (major depressive disorder), single episode, moderate (HCC) 05/21/2018  . Suicide attempt (HCC)   . Previous cesarean delivery, antepartum 03/07/2018  . Pre-existing type 2 diabetes affecting pregnancy, antepartum 01/30/2018  . Gallstone 01/30/2018  . Gestational hypertension 01/28/2018    Assessment: Leiah Giannotti is a 34 y.o. [id number] at [redacted]w[redacted]d here for scheduled CS.  #Repeat CS and BTL: Patient presents in active labor. After discussion of risks and benefits she declines vaginal attempt at delivery and would like repeat cesarean and tubal ligation.    The risks of cesarean section were discussed with the patient including but were not limited to: bleeding which may require transfusion or reoperation; infection which may require antibiotics; injury to bowel, bladder, ureters or other surrounding  organs; injury to the fetus; need for additional procedures including hysterectomy in the event of a life-threatening hemorrhage; placental abnormalities wth subsequent pregnancies, incisional problems, thromboembolic phenomenon and other postoperative/anesthesia complications.  Patient also desires permanent sterilization.  Other reversible forms of contraception were discussed with patient; she declines all other modalities. Risks of procedure discussed with patient including but not limited to: risk of regret, permanence of method, bleeding, infection, injury to surrounding organs and need for additional procedures.  Failure risk of about 1% with increased risk of ectopic gestation if pregnancy occurs was also discussed with patient.  Also discussed possibility of post-tubal pain syndrome. The patient concurred with the proposed plan, giving informed written consent for the procedures.  Patient has been NPO since 1930 she will remain NPO for procedure. Anesthesia and OR aware.  Preoperative prophylactic antibiotics and SCDs ordered on call to the OR.  To OR when ready.  #Anesthesia: Spinal #FWB: Cat II for tachycardia #GBS/ID: positive #COVID: swab in process #MOF: tbd #MOC: BTL #Circ: n/a  #T2DM: QID checks with basal and sliding scale insulin PP  #gHTN: diagnosed at 27 weeks per outpatient notes, mild range since arrival  #Hx depression: SW consult PP  Mary Sella [facility] 04/12/2019, 9:44 PM

## 2019-04-12 NOTE — Anesthesia Preprocedure Evaluation (Deleted)
Anesthesia Evaluation  Patient identified by MRN, date of birth, ID band Patient awake    Reviewed: Allergy & Precautions, NPO status , Patient's Chart, lab work & pertinent test results  Airway Mallampati: III  TM Distance: >3 FB Neck ROM: Full    Dental no notable dental hx. (+) Teeth Intact   Pulmonary Current SmokerPatient did not abstain from smoking.,    Pulmonary exam normal breath sounds clear to auscultation       Cardiovascular hypertension, Normal cardiovascular exam Rhythm:Regular Rate:Normal     Neuro/Psych PSYCHIATRIC DISORDERS Anxiety Depression negative neurological ROS     GI/Hepatic GERD  Medicated,(+)     substance abuse  marijuana use,   Endo/Other  diabetes, Poorly Controlled, GestationalMorbid obesity  Renal/GU negative Renal ROS  negative genitourinary   Musculoskeletal negative musculoskeletal ROS (+)   Abdominal (+) + obese,   Peds  Hematology   Anesthesia Other Findings   Reproductive/Obstetrics                            Anesthesia Physical Anesthesia Plan  ASA: III  Anesthesia Plan: Spinal   Post-op Pain Management:    Induction:   PONV Risk Score and Plan: 3 and Scopolamine patch - Pre-op, Ondansetron and Treatment may vary due to age or medical condition  Airway Management Planned: Natural Airway  Additional Equipment:   Intra-op Plan:   Post-operative Plan:   Informed Consent: I have reviewed the patients History and Physical, chart, labs and discussed the procedure including the risks, benefits and alternatives for the proposed anesthesia with the patient or authorized representative who has indicated his/her understanding and acceptance.     Dental advisory given  Plan Discussed with: CRNA, Surgeon and Anesthesiologist  Anesthesia Plan Comments:         Anesthesia Quick Evaluation

## 2019-04-12 NOTE — Transfer of Care (Signed)
Immediate Anesthesia Transfer of Care Note  Patient: Ellen Martin  Procedure(s) Performed: CESAREAN SECTION (N/A )  Patient Location: PACU  Anesthesia Type:Spinal  Level of Consciousness: awake, alert  and patient cooperative  Airway & Oxygen Therapy: Patient Spontanous Breathing  Post-op Assessment: Report given to RN and Post -op Vital signs reviewed and stable  Post vital signs: Reviewed and stable  Last Vitals:  Vitals Value Taken Time  BP 98/60 04/12/19 2331  Temp    Pulse 86 04/12/19 2333  Resp 22 04/12/19 2333  SpO2 98 % 04/12/19 2333  Vitals shown include unvalidated device data.  Last Pain:  Vitals:   04/12/19 2124  TempSrc: Oral         Complications: No apparent anesthesia complications

## 2019-04-12 NOTE — Anesthesia Procedure Notes (Signed)
Spinal  Patient location during procedure: OR Start time: 04/12/2019 9:46 PM End time: 04/12/2019 9:50 PM Staffing Performed: anesthesiologist  Anesthesiologist: Mal Amabile, MD Preanesthetic Checklist Completed: patient identified, IV checked, site marked, risks and benefits discussed, surgical consent, monitors and equipment checked, pre-op evaluation and timeout performed Spinal Block Patient position: sitting Prep: DuraPrep and site prepped and draped Patient monitoring: heart rate, cardiac monitor, continuous pulse ox and blood pressure Approach: midline Location: L3-4 Injection technique: single-shot Needle Needle type: Pencan  Needle gauge: 24 G Needle length: 9 cm Needle insertion depth: 8 cm Assessment Sensory level: T4 Additional Notes Patient tolerated procedure well. Adequate sensory level.

## 2019-04-12 NOTE — Anesthesia Postprocedure Evaluation (Signed)
Anesthesia Post Note  Patient: Ellen Martin  Procedure(s) Performed: CESAREAN SECTION (N/A )     Patient location during evaluation: PACU Anesthesia Type: Spinal Level of consciousness: oriented and awake and alert Pain management: pain level controlled Vital Signs Assessment: post-procedure vital signs reviewed and stable Respiratory status: spontaneous breathing, respiratory function stable and nonlabored ventilation Cardiovascular status: blood pressure returned to baseline and stable Postop Assessment: no headache, no backache, no apparent nausea or vomiting and spinal receding Anesthetic complications: no    Last Vitals:  Vitals:   04/12/19 2336 04/12/19 2345  BP: (!) 92/56 103/65  Pulse: 93 90  Resp: 16 15  Temp:    SpO2: 100% 100%    Last Pain:  Vitals:   04/12/19 2328  TempSrc:   PainSc: 0-No pain   Pain Goal:                Epidural/Spinal Function Cutaneous sensation: Able to Wiggle Toes (04/12/19 2345), Patient able to flex knees: Yes (04/12/19 2345), Patient able to lift hips off bed: No (04/12/19 2345), Back pain beyond tenderness at insertion site: No (04/12/19 2345), Progressively worsening motor and/or sensory loss: No (04/12/19 2345), Bowel and/or bladder incontinence post epidural: No (04/12/19 2345)  Suzi Hernan A.

## 2019-04-12 NOTE — Discharge Summary (Signed)
Postpartum Discharge Summary     Patient Name: Ellen Martin DOB: 12-19-1985 MRN: [id number]  Date of admission: 04/12/2019 Delivering Provider: Chancy Milroy   Date of discharge: 04/15/2019  Admitting diagnosis: Normal labor [O80, Z37.9] Intrauterine pregnancy: [redacted]w[redacted]d    Secondary diagnosis:  Active Problems:   Gestational hypertension   Pre-existing type 2 diabetes affecting pregnancy, antepartum   History of 2 cesarean sections   Adjustment disorder with mixed disturbance of emotions and conduct   Suicide attempt ([facility]   Supervision of high risk pregnancy, antepartum   GBS (group B Streptococcus carrier), +RV culture, currently pregnant   Normal labor   H/O tubal ligation  Additional problems: None     Discharge diagnosis: Term Pregnancy Delivered, Gestational Hypertension and Type 2 DM                                                                                                Post partum procedures:postpartum tubal ligation  Augmentation: n/a  Complications: None  Hospital course:  Onset of Labor With Unplanned C/S  34y.o. yo [id number] admitted in [place] 04/12/2019. Patient had a labor course significant for: arrived at 6cm, previously had desired TOLAC but on admission after discussion of risks and benefits elected for repeat cesarean and BTL. Membrane Rupture Time/Date: 10:18 PM ,04/12/2019   The patient went for cesarean section due to Elective Repeat, and delivered a Viable infant,04/12/2019  Details of operation can be found in separate operative note.    Patient was re-started on Metformin with a sliding scale, Lantus 10 u qhs initiated with good control and this regimen was prescribed on discharge. Vasotec 5 mg also initiated with good BP control. Prevena requested to be removed in 1 week. SW consulted for UDS positive for Amphetamines and THC. She is ambulating,tolerating a regular diet, passing flatus, and urinating well.  Patient is discharged  home in stable condition 04/15/19. Delivery time: 10:19 PM    Magnesium Sulfate received: No BMZ received: No Rhophylac:N/A MMR:N/A Transfusion:No  Physical exam  Vitals:   04/14/19 0344 04/14/19 2001 04/14/19 2043 04/15/19 0538  BP: 137/83  132/87 124/70  Pulse: 74  93 82  Resp: _0 Temp: 98.6 F (37 C)  98.7 F (37.1 C) 98.1 F (36.7 C)  TempSrc: Axillary  Axillary Oral  SpO2:   100% 99%  Weight:  (!) 141 kg    Height:  5' 9" (1.753 m)     General: alert, cooperative and no distress Lochia: appropriate Uterine Fundus: firm Incision: Prevena in place; appears clean, dry and intact DVT Evaluation: No evidence of DVT seen on physical exam. Labs: Lab Results  Component Value Date   WBC 21.5 (H) 04/13/2019   HGB 13.7 04/13/2019   HCT 43.1 04/13/2019   MCV 82.6 04/13/2019   PLT 272 04/13/2019   CMP Latest Ref Rng & Units 04/13/2019  Glucose 70 - 99 mg/dL 218(H)  BUN 6 - 20 mg/dL 9  Creatinine 0.44 - 1.00 mg/dL 0.78  Sodium 135 - 145 mmol/L 137  Potassium  3.5 - 5.1 mmol/L 4.6  Chloride 98 - 111 mmol/L 98  CO2 22 - 32 mmol/L 24  Calcium 8.9 - 10.3 mg/dL 8.6(L)  Total Protein 6.5 - 8.1 g/dL 5.5(L)  Total Bilirubin 0.3 - 1.2 mg/dL 0.8  Alkaline Phos 38 - 126 U/L 105  AST 15 - 41 U/L 17  ALT 0 - 44 U/L 8    Discharge instruction: per After Visit Summary and "Baby and Me Booklet".  After visit meds:  Allergies as of 04/15/2019      Reactions   Azithromycin Hives   Hives without SOB or wheezing after given Azithromycin for cesarean      Medication List    STOP taking these medications   hydrOXYzine 25 MG tablet Commonly known as: ATARAX/VISTARIL   pantoprazole 40 MG tablet Commonly known as: Protonix   scopolamine 1 MG/3DAYS Commonly known as: TRANSDERM-SCOP   traZODone 50 MG tablet Commonly known as: DESYREL     TAKE these medications   enalapril 5 MG tablet Commonly known as: VASOTEC Take 1 tablet (5 mg total) by mouth daily.    HYDROcodone-acetaminophen 5-325 MG tablet Commonly known as: NORCO/VICODIN Take 1-2 tablets by mouth every 4 (four) hours as needed for moderate pain.   ibuprofen 800 MG tablet Commonly known as: ADVIL Take 1 tablet (800 mg total) by mouth every 8 (eight) hours.   insulin glargine 100 UNIT/ML injection Commonly known as: LANTUS Inject 0.1 mLs (10 Units total) into the skin daily.   loratadine 10 MG tablet Commonly known as: CLARITIN Take 1 tablet (10 mg total) by mouth daily.   metFORMIN 500 MG tablet Commonly known as: GLUCOPHAGE Take 1 tablet (500 mg total) by mouth 2 (two) times daily with a meal. For diabetes   senna-docusate 8.6-50 MG tablet Commonly known as: Senokot-S Take 2 tablets by mouth daily. Start taking on: April 16, 2019       Diet: carb modified diet  Activity: Advance as tolerated. Pelvic rest for 6 weeks.   Outpatient follow up:4 weeks Follow up Appt: Future Appointments  Date Time Provider Department Center  04/21/2019  2:45 PM Constant, Peggy, MD [facility] None  04/21/2019  3:00 PM Figueroa, Andrea, LCSW [facility] None  05/13/2019  1:00 PM Ervin, Michael L, MD [facility] None   Follow up Visit:   Please schedule this patient for Postpartum visit in: 4 weeks with the following provider: MD In-Person For C/S patients schedule nurse incision check in weeks 2 weeks: yes High risk pregnancy complicated by: T2DM, gHTN, hx prior cesarean, scant prenatal care Delivery mode:  CS Anticipated Birth Control:  BTL done PP PP Procedures needed: Wound vac check, incision check, BP check, referral for DM care  Schedule Integrated BH visit: yes   Newborn Data: Live born child  Birth Weight:  4040g APGAR: 7, 9  Newborn Delivery   Birth date/time: 04/12/2019 22:19:00 Delivery type: C-Section, Low Transverse Trial of labor: No C-section categorization: Repeat      Baby Feeding: Bottle Disposition:home with mother   04/15/2019 Chelsea N Fair, MD   

## 2019-04-12 NOTE — MAU Note (Signed)
Approx 2110, Call from registration, saying that pt in lobby and has to push, brought pt straight back to rm 120 via w/c.  No bleeding noted. No leaking noted. Pt reports this is her 5 th baby.  Cervical exam 6.5/90/-2, notified dr Crissie Reese, danielle RN notified and gave report L&D Auriel RN for bed, pt can go to rm 217, collected covid swab, pt sent to L&D via bed. Pt breathing well with her contractions, in good control. Ellen Martin CNM accompanied RNx2 to rm 217.

## 2019-04-12 NOTE — Op Note (Signed)
Cesarean Section Procedure Note  04/12/2019  10:56 PM  PATIENT:  Ellen Martin  34 y.o. female  PRE-OPERATIVE DIAGNOSIS:  CESAREAN SECTION, BILATERAL TUBAL LIGATION WITH FILSHIE CLIPS  POST-OPERATIVE DIAGNOSIS:  CESAREAN SECTION, BILATERAL TUBAL LIGATION WITH FILSHIE CLIPS  PROCEDURE:  Procedure(s): CESAREAN SECTION (N/A)  SURGEON:  Surgeon(s) and Role:    * Hermina Staggers, MD - Primary    * Venora Maples, MD - Fellow  ASSISTANTS: none   ANESTHESIA:   spinal  EBL:  Total I/O In: 1050 [I.V.:1000; IV Piggyback:50] Out: 536 [Urine:150; Blood:386]  BLOOD ADMINISTERED:none  DRAINS: Prevena   LOCAL MEDICATIONS USED:  NONE  SPECIMEN:  Source of Specimen:  Placenta  DISPOSITION OF SPECIMEN:  PATHOLOGY   Procedure Details  The risks, benefits, complications, treatment options, and expected outcomes were discussed with the patient.  The patient concurred with the proposed plan, giving informed consent.  The site of surgery properly noted/marked. The patient was taken to Operating Room, identified as Ellen Martin and the procedure verified as C-Section Delivery. A Time Out was held and the above information confirmed.  After induction of anesthesia, the patient was draped and prepped in the usual sterile manner. A Pfannenstiel incision was made and carried down through the subcutaneous tissue to the fascia. Fascial incision was made and extended transversely. The fascia was separated from the underlying rectus tissue superiorly and inferiorly. The peritoneum was identified and entered. Peritoneal incision was extended longitudinally. The utero-vesical peritoneal reflection was incised transversely and the bladder flap was bluntly freed from the lower uterine segment. A low transverse uterine incision was made. Delivered from cephalic presentation was a 4040 gram Female with Apgar scores of 7 at one minute and 9 at five minutes. After the umbilical cord was clamped and cut cord  blood was obtained for evaluation. The placenta was removed intact and appeared normal. The uterine outline, tubes and ovaries appeared normal. The uterine incision was closed with running locked sutures of 1-0 Chromic. Hemostasis was observed. Attention was then turned to the left fallopian tube, and a Filshie clip was placed approximately 3 cm from the cornua and seen to transect the entire tube. A similar process was then carried out on the right fallopian tube. The peritoneum and rectus muscles were then reapproximated with 2-0 Vicryl. The fascia was then reapproximated with running sutures of Vicryl. The subcutaneous tissue was reapproximated with interrupted sutures of Vicryl. skin was reapproximated with Vicryl.  Instrument, sponge, and needle counts were correct prior the abdominal closure and at the conclusion of the case.   Complications:  None; patient tolerated the procedure well.  COUNTS:  CORRECT  PLAN OF CARE: Admit to inpatient   PATIENT DISPOSITION:  PACU - hemodynamically stable.   Delay start of Pharmacological VTE agent (>24hrs) due to surgical blood loss or risk of bleeding: yes  Venora Maples, MD 04/12/2019 10:56 PM

## 2019-04-13 ENCOUNTER — Encounter (HOSPITAL_COMMUNITY): Payer: Self-pay | Admitting: Obstetrics and Gynecology

## 2019-04-13 LAB — CBC
HCT: 43.1 % (ref 36.0–46.0)
Hemoglobin: 13.7 g/dL (ref 12.0–15.0)
MCH: 26.2 pg (ref 26.0–34.0)
MCHC: 31.8 g/dL (ref 30.0–36.0)
MCV: 82.6 fL (ref 80.0–100.0)
Platelets: 272 10*3/uL (ref 150–400)
RBC: 5.22 MIL/uL — ABNORMAL HIGH (ref 3.87–5.11)
RDW: 13.7 % (ref 11.5–15.5)
WBC: 21.5 10*3/uL — ABNORMAL HIGH (ref 4.0–10.5)
nRBC: 0 % (ref 0.0–0.2)

## 2019-04-13 LAB — GLUCOSE, CAPILLARY
Glucose-Capillary: 184 mg/dL — ABNORMAL HIGH (ref 70–99)
Glucose-Capillary: 398 mg/dL — ABNORMAL HIGH (ref 70–99)
Glucose-Capillary: 262 mg/dL — ABNORMAL HIGH (ref 70–99)
Glucose-Capillary: 223 mg/dL — ABNORMAL HIGH (ref 70–99)
Glucose-Capillary: 211 mg/dL — ABNORMAL HIGH (ref 70–99)

## 2019-04-13 LAB — COMPREHENSIVE METABOLIC PANEL
ALT: 8 U/L (ref 0–44)
AST: 17 U/L (ref 15–41)
Albumin: 2.2 g/dL — ABNORMAL LOW (ref 3.5–5.0)
Alkaline Phosphatase: 105 U/L (ref 38–126)
Anion gap: 15 (ref 5–15)
BUN: 9 mg/dL (ref 6–20)
CO2: 24 mmol/L (ref 22–32)
Calcium: 8.6 mg/dL — ABNORMAL LOW (ref 8.9–10.3)
Chloride: 98 mmol/L (ref 98–111)
Creatinine, Ser: 0.78 mg/dL (ref 0.44–1.00)
GFR calc Af Amer: >60 mL/min (ref >60)
GFR calc non Af Amer: >60 mL/min (ref >60)
Glucose, Bld: 218 mg/dL — ABNORMAL HIGH (ref 70–99)
Potassium: 4.6 mmol/L (ref 3.5–5.1)
Sodium: 137 mmol/L (ref 135–145)
Total Bilirubin: 0.8 mg/dL (ref 0.3–1.2)
Total Protein: 5.5 g/dL — ABNORMAL LOW (ref 6.5–8.1)

## 2019-04-13 LAB — HEMOGLOBIN A1C
Hgb A1c MFr Bld: 8.8 % — ABNORMAL HIGH (ref 4.8–5.6)
Mean Plasma Glucose: 205.86 mg/dL

## 2019-04-13 LAB — ABO/RH: ABO/RH(D): O POS

## 2019-04-13 LAB — RPR: RPR Ser Ql: NONREACTIVE

## 2019-04-13 MED ORDER — DIPHENHYDRAMINE HCL 50 MG/ML IJ SOLN
12.5000 mg | Freq: Once | INTRAMUSCULAR | Status: AC
  Administered 2019-04-13: 01:00:00 12.5 mg via INTRAVENOUS

## 2019-04-13 MED ORDER — SIMETHICONE 80 MG PO CHEW
80.0000 mg | CHEWABLE_TABLET | Freq: Three times a day (TID) | ORAL | Status: DC
  Administered 2019-04-13 – 2019-04-15 (×8): 80 mg via ORAL
  Filled 2019-04-13 (×9): qty 1

## 2019-04-13 MED ORDER — HYDROCODONE-ACETAMINOPHEN 5-325 MG PO TABS: 1.0000 | ORAL_TABLET | ORAL | Status: DC | PRN

## 2019-04-13 MED ORDER — INSULIN ASPART 100 UNIT/ML ~~LOC~~ SOLN
0.0000 [IU] | Freq: Three times a day (TID) | SUBCUTANEOUS | Status: DC
  Administered 2019-04-13: 8 [IU] via SUBCUTANEOUS
  Administered 2019-04-13: 09:00:00 15 [IU] via SUBCUTANEOUS
  Administered 2019-04-13: 5 [IU] via SUBCUTANEOUS
  Administered 2019-04-14: 20:00:00 2 [IU] via SUBCUTANEOUS
  Administered 2019-04-14: 3 [IU] via SUBCUTANEOUS
  Administered 2019-04-14: 17:00:00 8 [IU] via SUBCUTANEOUS
  Administered 2019-04-15: 14:00:00 3 [IU] via SUBCUTANEOUS

## 2019-04-13 MED ORDER — LACTATED RINGERS IV SOLN: INTRAVENOUS | Status: DC

## 2019-04-13 MED ORDER — PRENATAL MULTIVITAMIN CH
1.0000 | ORAL_TABLET | Freq: Every day | ORAL | Status: DC
  Administered 2019-04-13 – 2019-04-15 (×3): 1 via ORAL
  Filled 2019-04-13 (×3): qty 1

## 2019-04-13 MED ORDER — CAMPHOR-MENTHOL 0.5-0.5 % EX LOTN
TOPICAL_LOTION | CUTANEOUS | Status: DC | PRN
  Filled 2019-04-13: qty 222

## 2019-04-13 MED ORDER — SIMETHICONE 80 MG PO CHEW: 80.0000 mg | CHEWABLE_TABLET | ORAL | Status: DC | PRN

## 2019-04-13 MED ORDER — MENTHOL 3 MG MT LOZG: 1.0000 | LOZENGE | OROMUCOSAL | Status: DC | PRN

## 2019-04-13 MED ORDER — DIBUCAINE (PERIANAL) 1 % EX OINT: 1.0000 "application " | TOPICAL_OINTMENT | CUTANEOUS | Status: DC | PRN

## 2019-04-13 MED ORDER — SIMETHICONE 80 MG PO CHEW
80.0000 mg | CHEWABLE_TABLET | ORAL | Status: DC
  Administered 2019-04-14: 22:00:00 80 mg via ORAL
  Filled 2019-04-13 (×3): qty 1

## 2019-04-13 MED ORDER — WITCH HAZEL-GLYCERIN EX PADS: 1.0000 "application " | MEDICATED_PAD | CUTANEOUS | Status: DC | PRN

## 2019-04-13 MED ORDER — DIPHENHYDRAMINE HCL 50 MG/ML IJ SOLN
INTRAMUSCULAR | Status: AC
  Filled 2019-04-13: qty 1

## 2019-04-13 MED ORDER — LACTATED RINGERS IV BOLUS: 1000.0000 mL | Freq: Once | INTRAVENOUS | Status: AC

## 2019-04-13 MED ORDER — INSULIN ASPART 100 UNIT/ML ~~LOC~~ SOLN
0.0000 [IU] | Freq: Every day | SUBCUTANEOUS | Status: DC
  Administered 2019-04-13: 23:00:00 2 [IU] via SUBCUTANEOUS

## 2019-04-13 MED ORDER — LORATADINE 10 MG PO TABS
10.0000 mg | ORAL_TABLET | Freq: Every day | ORAL | Status: DC
  Administered 2019-04-13 – 2019-04-15 (×3): 10 mg via ORAL
  Filled 2019-04-13 (×3): qty 1

## 2019-04-13 MED ORDER — OXYTOCIN 40 UNITS IN NORMAL SALINE INFUSION - SIMPLE MED: 2.5000 [IU]/h | INTRAVENOUS | Status: AC

## 2019-04-13 MED ORDER — DIPHENHYDRAMINE HCL 25 MG PO CAPS
25.0000 mg | ORAL_CAPSULE | Freq: Four times a day (QID) | ORAL | Status: DC | PRN
  Administered 2019-04-13 (×2): 25 mg via ORAL
  Filled 2019-04-13 (×2): qty 1

## 2019-04-13 MED ORDER — IBUPROFEN 800 MG PO TABS
800.0000 mg | ORAL_TABLET | Freq: Three times a day (TID) | ORAL | Status: DC
  Administered 2019-04-14 – 2019-04-15 (×5): 800 mg via ORAL
  Filled 2019-04-13 (×5): qty 1

## 2019-04-13 MED ORDER — SENNOSIDES-DOCUSATE SODIUM 8.6-50 MG PO TABS
2.0000 | ORAL_TABLET | ORAL | Status: DC
  Administered 2019-04-13 – 2019-04-14 (×2): 2 via ORAL
  Filled 2019-04-13 (×3): qty 2

## 2019-04-13 MED ORDER — BACITRACIN ZINC 500 UNIT/GM EX OINT
TOPICAL_OINTMENT | Freq: Two times a day (BID) | CUTANEOUS | Status: DC
  Filled 2019-04-13 (×2): qty 28.4

## 2019-04-13 MED ORDER — METFORMIN HCL 500 MG PO TABS
500.0000 mg | ORAL_TABLET | Freq: Two times a day (BID) | ORAL | Status: DC
  Administered 2019-04-13 – 2019-04-15 (×5): 500 mg via ORAL
  Filled 2019-04-13 (×5): qty 1

## 2019-04-13 MED ORDER — KETOROLAC TROMETHAMINE 30 MG/ML IJ SOLN
30.0000 mg | Freq: Four times a day (QID) | INTRAMUSCULAR | Status: AC
  Administered 2019-04-13 – 2019-04-14 (×4): 30 mg via INTRAVENOUS
  Filled 2019-04-13 (×4): qty 1

## 2019-04-13 MED ORDER — ENOXAPARIN SODIUM 80 MG/0.8ML ~~LOC~~ SOLN
0.5000 mg/kg | SUBCUTANEOUS | Status: DC
  Administered 2019-04-13 – 2019-04-15 (×3): 70 mg via SUBCUTANEOUS
  Filled 2019-04-13 (×3): qty 0.8

## 2019-04-13 MED ORDER — INSULIN GLARGINE 100 UNIT/ML ~~LOC~~ SOLN
10.0000 [IU] | SUBCUTANEOUS | Status: DC
  Administered 2019-04-13 – 2019-04-14 (×2): 10 [IU] via SUBCUTANEOUS
  Filled 2019-04-13 (×3): qty 0.1

## 2019-04-13 MED ORDER — COCONUT OIL OIL: 1.0000 "application " | TOPICAL_OIL | Status: DC | PRN

## 2019-04-13 MED ORDER — PNEUMOCOCCAL VAC POLYVALENT 25 MCG/0.5ML IJ INJ
0.5000 mL | INJECTION | INTRAMUSCULAR | Status: AC
  Administered 2019-04-15: 15:00:00 0.5 mL via INTRAMUSCULAR
  Filled 2019-04-13: qty 0.5

## 2019-04-13 NOTE — Progress Notes (Signed)
Called to bedside for rash on chest. Denies SOB or wheezing.   On exam mild urticarial rash noted on chest.  Suspect allergic reaction to Azithromycin given for cesarean as she has previously had penicillins. She denies ever taking this medication. Added to allergy list. Already given 12.5mg  IV Benadryl by anesthesia, will give additional 12.5mg  IV now.

## 2019-04-13 NOTE — Clinical Social Work Maternal (Signed)
CLINICAL SOCIAL WORK MATERNAL/CHILD NOTE  Patient Details  Name: Ellen Martin MRN: [id number] Date of Birth: 08/28/1985  Date:  04/13/2019  Clinical Social Worker Initiating Note:  Amauri Medellin Date/Time: Initiated:  04/13/19/0902     Child's Name:  Jolina-Sky Perez-Dehart   Biological Parents:  Mother, Father(Hadlyn Bergfeld and [name] Perez DOB: 03/19/1982)   Need for Interpreter:  None   Reason for Referral:  Behavioral Health Concerns   Address:  [address] [place] [address]    Phone number:  [phone] (FOB's phone number); [phone] (Daughter's phone number); [phone] (MGM's phone number)  MOB does not have her own phone number and states it is best to contact one of the above numbers     Additional phone number:   Household Members/Support Persons (HM/SP):   Household Member/Support Person 1, Household Member/Support Person 2, Household Member/Support Person 3, Household Member/Support Person 4, Household Member/Support Person 5   HM/SP Name Relationship DOB or Age  HM/SP -1 [name] Perez-Span Son 03/07/2018  HM/SP -2 Angela Perez-Ice Daughter 03/07/2018  HM/SP -3 Annie Garcia-Domagalski Daughter 11/15/2012  HM/SP -4 Laura Utz Daughter (lives with MOB's cousin) 04/15/2007  HM/SP -5 Andrea Guerin-Balles Daughter (lives with MOB's cousin) 01/11/2006  HM/SP -6        HM/SP -7        HM/SP -8          Natural Supports (not living in the home):  Neighbors, Parent   Professional Supports: None   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)   Type of Work:     Education:  Attending college   Homebound arranged:    Financial Resources:  Medicaid   Other Resources:  Food Stamps , WIC   Cultural/Religious Considerations Which May Impact Care:    Strengths:  Ability to meet basic needs , Home prepared for child , Pediatrician chosen   Psychotropic Medications:         Pediatrician:    [place] area  Pediatrician  List:   [place]    [place] Other([facility])  [place]    [place]    [place]    [place]      Pediatrician Fax Number:    Risk Factors/Current Problems:  Mental Health Concerns , Substance Use    Cognitive State:  Able to Concentrate , Alert , Linear Thinking    Mood/Affect:  Calm , Comfortable , Happy , Interested , Relaxed    CSW Assessment:  CSW received consult for history of anxiety and depression with recent SI in 05/2018.  CSW met with MOB to offer support and complete assessment.    MOB sitting up in bed with infant asleep in bassinet and MGM present at bedside. CSW introduced self and received verbal permission to request that MGM step out of the room so that CSW could meet with MOB in private. MGM understanding and left voluntarily. MOB reported she currently lives in [place] with her 3 youngest children. MOB shared she has two older children but that they live with her cousin Shelly Amen who has full custody of children. MOB reported that they have lived with her since 2010. MOB denied any CPS involvement with placement and stated it was a mutually agreed upon arrangement. MOB stated FOB is currently involved but that he lives in [place] and will be going back soon after infant's birth. MOB stated FOB has elderly family members that he takes care of in [place] but that   he tries to visit when he is able to. CSW inquired about MOB felt about this and MOB stated she understands and is prepared. MOB reported that her mother who lives in [name] will be here until 03/04 to help and offer support. CSW inquired about MOB's level of support once FOB and MGM leave and MOB shared she has good support from her neighbors and that they all look out for each other. MOB also open to [facility] and [facility] referrals to offer additional support post-discharge. MOB shared that she is currently in school at [employer] to get her degree  for medical coding. MOB reported she is not currently employed but will help her neighbors translate for money. MOB confirmed she receives both WIC and food stamps and is aware of how to get infants added to her plans.   CSW inquired about MOB's mental health history and MOB acknowledged recent SI in 05/2018. Per MOB, this was an isolated event to get attention from FOB and reported she has not had any SI since. MOB reported she currently feels fine but did allude to an anxiety attack the night prior due to situational stressors. MOB acknowledged diagnosis of depression and anxiety when she was younger but denied any symptoms during her pregnancy. MOB reported she is not currently on any medications or receiving counseling and denied any interest in either, at this time, but reported she is aware of her options if symptoms were to arise. MOB denied any previous PPD/A with her other pregnancies but stated she was told her incident of SI may have been a result of PPD. MOB reported some sadness post-partum but again stated incident was an isolated event. CSW provided education regarding the baby blues period vs. perinatal mood disorders, discussed treatment and gave resources for mental health follow up if concerns arise.  CSW recommends self-evaluation during the postpartum time period using the New Mom Checklist from Postpartum Progress and encouraged MOB to contact a medical professional if symptoms are noted at any time. MOB did not appear to be displaying any acute mental health symptoms and denied any current SI, HI or DV. MOB reported having good support from FOB, her mother and her neighbors. MOB confirmed having the majority of items need for infant once discharged. Per MOB, she received a car seat from the fire department and that they were working on getting her a pack 'n' play. CSW offered to provide MOB with Baby Box prior to discharge in the event a safe sleeping option was not available once ready. MOB  appreciative of this. CSW provided review of Sudden Infant Death Syndrome (SIDS) precautions and safe sleeping habits.   CSW inquired about MOB's substance use history and MOB acknowledged use of marijuana during her pregnancy. MOB reported she only used a couple of times the last two months of her pregnancy as she couldn't eat but stated her last use was in November. CSW informed of MOB of Hospital Drug Policy and explained UDS and CDS were still pending but that a CPS report would be made, if warranted. MOB noted previous CPS involvement with her twins due to positive CDS for marijuana. MOB reported she did everything they told her to do and the case was closed. MOB denied any CPS involvement prior to or since that incident. MOB denied any questions or concerns regarding policy.   CSW to continue to monitor UDS and CDS results and make [place] CPS report, if warranted.    CSW Plan/Description:    Sudden Infant Death Syndrome (SIDS) Education, Perinatal Mood and Anxiety Disorder (PMADs) Education, [place], Other Information/Referral to [employer], CSW Will Continue to Monitor Umbilical Cord Tissue Drug Screen Results and Make Report if Foye Spurling, [place] 04/13/2019, 9:49 AM

## 2019-04-13 NOTE — Plan of Care (Signed)
  Problem: Life Cycle: Goal: Chance of risk for complications during the postpartum period will decrease Note: Urine tea colored and only 100 mL emptied on night shift after arriving to unit at 0100 am. Notified Dr. Salomon Mast, who ordered a 1000 mL LR bolus. Within 3 hours, patient had 100 mL urine output which was yellowing in tubing. Notified Dr. Salomon Mast of results. No new orders written. Earl Gala, Linda Hedges [place]

## 2019-04-13 NOTE — Progress Notes (Signed)
POSTPARTUM PROGRESS NOTE  Subjective: Ellen Martin is a 34 y.o. [id number] on POD#1 s/p rLTCS with BTL at [redacted]w[redacted]d.  She reports she doing well. No acute events overnight. She denies any problems with ambulating, voiding or po intake. Denies nausea or vomiting. She has passed flatus. Pain is well controlled.  Lochia is appropriate. She continues to have itching on her chest, legs and arms.  Per nursing patient has only had 100 mL tea-colored urine output since 0100 hours. Patient reports that she is thirsty and drinking a lot.  Objective: Blood pressure 96/80, pulse (!) 113, temperature (!) 97.5 F (36.4 C), temperature source Oral, resp. rate (!) 24, weight (!) 141 kg, last menstrual period 07/18/2018, SpO2 100 %, not currently breastfeeding.  Physical Exam:  General: alert, cooperative and no distress Chest: no respiratory distress Abdomen: soft, non-tender. Wound vac in place. Uterine Fundus: firm, appropriately tender Extremities: No calf swelling or tenderness  No edema Skin: Multiple tattoos and multiple itchy, raised, red welts consistent with urticaria on her chest and irregularly located on her arms and legs. Some small, 1 cm sores on abdomen with excoriation, one of which has scabbing.  Recent Labs    04/12/19 2119 04/13/19 0154  HGB 12.8 13.7  HCT 38.9 43.1    Assessment/Plan: Ellen Martin is a 34 y.o. [id number] on POD#1 s/p rLTCS with BTL at [redacted]w[redacted]d.  Routine Postpartum Care: Doing well, pain well-controlled.  -- Continue routine care, lactation support  -- Contraception: BTL -- Feeding: Bottle -- T2DM: on SSI and basal insulin. Metformin started 500 mg BID. Outpatient referral to primary care for T2DM management sent. -- Allergic reaction to Azithromycin: itching helped with Benadryl, started on Claritin for longer-acting relief. -- Decreased urine output: LR bolus running, will continue to monitor.  Dispo: Plan for discharge tomorrow.  Marlowe Alt, DO OB/GYN  Fellow, [facility] for [facility]

## 2019-04-14 ENCOUNTER — Encounter (HOSPITAL_COMMUNITY): Payer: Self-pay | Admitting: Obstetrics and Gynecology

## 2019-04-14 ENCOUNTER — Other Ambulatory Visit: Payer: Self-pay

## 2019-04-14 LAB — GLUCOSE, CAPILLARY
Glucose-Capillary: 186 mg/dL — ABNORMAL HIGH (ref 70–99)
Glucose-Capillary: 153 mg/dL — ABNORMAL HIGH (ref 70–99)
Glucose-Capillary: 266 mg/dL — ABNORMAL HIGH (ref 70–99)
Glucose-Capillary: 139 mg/dL — ABNORMAL HIGH (ref 70–99)
Glucose-Capillary: 135 mg/dL — ABNORMAL HIGH (ref 70–99)

## 2019-04-14 LAB — SURGICAL PATHOLOGY

## 2019-04-14 MED ORDER — INSULIN GLARGINE 100 UNIT/ML ~~LOC~~ SOLN
10.0000 [IU] | SUBCUTANEOUS | Status: DC
  Administered 2019-04-14: 10 [IU] via SUBCUTANEOUS
  Filled 2019-04-14 (×2): qty 0.1

## 2019-04-14 MED ORDER — ENALAPRIL MALEATE 5 MG PO TABS
5.0000 mg | ORAL_TABLET | Freq: Every day | ORAL | Status: DC
  Administered 2019-04-14 – 2019-04-15 (×2): 5 mg via ORAL
  Filled 2019-04-14 (×2): qty 1

## 2019-04-14 NOTE — Progress Notes (Addendum)
Subjective: Postpartum Day 2: Cesarean Delivery and BTL Patient reports tolerating PO, + flatus and + BM.  Patient did not recall number of voids, and second void after removal of foley not charted yesterday.  Objective: Vital signs in last 24 hours: Temp:  [97.7 F (36.5 C)-98.6 F (37 C)] 98.6 F (37 C) (02/09 0344) Pulse Rate:  [74-82] 74 (02/09 0344) Resp:  [16-22] 16 (02/09 0344) BP: (115-137)/(64-92) 137/83 (02/09 0344) SpO2:  [98 %-100 %] 100 % (02/08 1747)  Physical Exam:  General: appears stated age, fatigued, no distress and morbidly obese Lochia: appropriate Uterine Fundus: firm Incision: tegaderm and wound vac in place and working, no evidence of erythema or drainage DVT Evaluation: No evidence of DVT seen on physical exam. Negative Homan's sign. No cords or calf tenderness. No significant calf/ankle edema.  Recent Labs    04/12/19 2119 04/13/19 0154  HGB 12.8 13.7  HCT 38.9 43.1    Assessment/Plan: Status post Cesarean section. Doing well postoperatively. UOP minimal yesterday by foley. Need another UOP charted, discussed with RN today who will measure.   Infant cord blood positive for amphetamines, SW following up with appropriate protocol. Infant likely will not be discharged today. Blood sugars improving, 398 yesterday morning, declined in the 200s over the course of the day, most recent this morning 186. Pt received Lantus at 4AM today, expect CBG to be improved throughout the day today. Will continue to monitor. Patient had gHTN, all BP's normotensive during admission except last night mildly elevated 125/92. Can consider antihypertensive prior to d/c.  Continue current care. Plan for d/c tomorrow.  Gladys Damme 04/14/2019, 8:18 AM  GME ATTESTATION:  I saw and evaluated the patient. I agree with the findings and the plan of care as documented in the resident's note with addition of the following:  Patient was eating a Big Mac when I entered the  room. Diabetic coordinator requested for diabetes education. Lantus changed to 10 units at 10 PM.  Some elevated blood pressures, starting Enalapril as patient is diabetic and should be on an ACE inhibitor for both BP control and renal protection.  Office message for BP check and BMP in one week. Urine output appropriate, has had 900 out since seen by the resident this morning. Urticaria better controlled with Claritin. Skin no longer shows itchy, raised, red welts on her chest, arms or legs. Sores on abdomen show some healing and are less inflamed. Patient will be discharged home tomorrow.  Merilyn Baba, DO OB Fellow, [place] for [place] 04/14/2019 11:42 AM

## 2019-04-14 NOTE — Plan of Care (Signed)
  Problem: Life Cycle: Goal: Chance of risk for complications during the postpartum period will decrease Note: Spoke with Dr. Leary Roca this morning about patient voiding. Night shift RN reported that patient has voided multiple times. Patient states she has voided large amounts in the toilet and the hat multiple times. Only one void documented with measurement. Dr. Leary Roca requested that one more urine be measured but stated the the order for the urine protein creatinine may be discontinued.  Placed hat in toilet this morning; however, patient was asleep. Rounded on patient again and she was awake. Patient stated she voided in toilet and did not measure, but states that she voided a large amount. Placed hat back in toilet and encouraged patient to void in hat one more time. Also encouraged patient to call for staff to obtain [facility] blood sugar after she orders food. Earl Gala, Linda Hedges [place]

## 2019-04-14 NOTE — Progress Notes (Addendum)
CSW aware infant's UDS has resulted and is positive for amphetamines. CSW has made [facility] CPS report due to UDS results. At this time, there are barriers to infant's discharge. CSW to await CPS disposition.  Update: CSW spoke with MOB at bedside regarding UDS results. MOB initially reported last use was in December. CSW explained infant's UDS would not still be positive if last use was in December and that use was likely within the last 72 hours. MOB then disclosed that she last used on 2/5. CSW inquired about frequency and duration of use to which MOB stated she only uses on the weekends and has been since December. When asked how much MOB uses MOB only stated "enough to get up and move". MOB reported MGM and FOB are not aware of her use and requested that this not be discussed with them present. CSW inquired about if MOB was interested in substance use resources to which MOB did not give a clear answer as she was concerned about CPS taking her baby. CSW explained CPS report has been made and that baby cannot discharge until CPS disposition has been decided. MOB understanding of this. CSW to provide substance use resources and will continue to follow.  Ellen Ng, LCSW [facility] [phone]

## 2019-04-14 NOTE — Progress Notes (Signed)
Pt has been asleep for a majority of my time 3p-7:30p. Dayshift RN described Pt to be very somnolent as well. Pt awake for 2 meals only then falls back to sleep. Baby wasn't fed until I asked her to stay awake long enough to feed her. Pt initiated a feeding at 7:30pm. When I asked the Pt why she thinks she's been asleep all day (more than average)The Pt. replied, "I haven't gotten any sleep". The significant other left earlier before my shift and Pt confirms she won't be back. Discussed with oncoming RN the benefits to baby of leaving baby in the NSY.

## 2019-04-14 NOTE — Progress Notes (Addendum)
Inpatient Diabetes Program Recommendations  AACE/ADA: New Consensus Statement on Inpatient Glycemic Control (2015)  Target Ranges:  Prepandial:   less than 140 mg/dL      Peak postprandial:   less than 180 mg/dL (1-2 hours)      Critically ill patients:  140 - 180 mg/dL   Lab Results  Component Value Date   GLUCAP 153 (H) 04/14/2019   HGBA1C 8.8 (H) 04/13/2019    Review of Glycemic Control Results for Ellen Martin, Ellen Martin (MRN [id number]) as of 04/14/2019 12:55  Ref. Range 04/13/2019 17:49 04/13/2019 22:17 04/14/2019 03:39 04/14/2019 10:12  Glucose-Capillary Latest Ref Range: 70 - 99 mg/dL 223 (H) 211 (H) 186 (H) 153 (H)   Diabetes history: Type 2 DM Outpatient Diabetes medications: Metformin 500 mg BID Current orders for Inpatient glycemic control: Novolog 0-15 units TID, Novolog 0-5 units QHS, Lantus 10 units QD, Metformin 500 mg BID Decadron 10 mg x 1 on 2/7  Inpatient Diabetes Program Recommendations:    Noted consult.   Consider:  - Increasing Lantus to 15 units QHS.  - Increasing metformin 1000 mg BID. Will also place consult for case management for appropriate follow up care with PCP.  Attempted to speak with patient, Per RN patient is sleeping and not a good time for education. Will re attempt this afternoon.   Addendum: Spoke with patient regarding outpatient diabetes management. Was diagnosed with Type 2 following her previous pregnancy. Patient recently received glucose meter and had not been checking blood sugars; it seems that patient never had stable MD visit due to income. Consult placed for consumption of Big Mac and poor understanding of carbohydrate intake.   Reviewed patient's current A1c of 8.8%. Explained what a A1c is and what it measures. Also reviewed goal A1c with patient, importance of good glucose control @ home, and blood sugar goals. Reviewed patho of DM especially in post partum period, role of pancreas, realiability of A1C in pregnancy, need for continued monitoring  and follow up, survival skills, Relion products, signs and symptoms of hypo vs hyper glycemia, when to call MD, action of Metformin, insulin resisitance, vascular changes and commorbidities associated from poor glycemic control.  Patient has a meter and testing supplies. Encouraged to take CBG once per day and to take values with her at her follow up appointment. Reviewed when to call MD.  Patient denies drinking sugary beverages, however, admits to large amounts of carbohydrate intake. Per sitting may consume as many as 6 tortillas. Reviewed carbohydrates, importance of being mindful, foods that are higher in carbs, portion sizes and suggested alternatives that may work for her family. Patient in agreement, but not sure how motivated patient is towards making changes. Reviewed goal setting and suggested 3 appropriate goals she should consider. Will place consult for dietitian to also review and OP education. Stressed the importance of following up with PCP, taking medications as prescribed and checking blood sugars. Patient has no further questions at this time.     Thanks, Bronson Curb, MSN, RNC-OB Diabetes Coordinator [phone] (8a-5p)

## 2019-04-15 ENCOUNTER — Other Ambulatory Visit (HOSPITAL_COMMUNITY): Payer: Medicaid Other

## 2019-04-15 LAB — GLUCOSE, CAPILLARY
Glucose-Capillary: 97 mg/dL (ref 70–99)
Glucose-Capillary: 164 mg/dL — ABNORMAL HIGH (ref 70–99)

## 2019-04-15 MED ORDER — HYDROCODONE-ACETAMINOPHEN 5-325 MG PO TABS: 1.0000 | ORAL_TABLET | ORAL | 0 refills | Status: DC | PRN

## 2019-04-15 MED ORDER — HYDROCODONE-ACETAMINOPHEN 5-325 MG PO TABS: 1.0000 | ORAL_TABLET | ORAL | 0 refills | Status: AC | PRN

## 2019-04-15 MED ORDER — IBUPROFEN 800 MG PO TABS: 800.0000 mg | ORAL_TABLET | Freq: Three times a day (TID) | ORAL | 0 refills | Status: DC

## 2019-04-15 MED ORDER — SENNOSIDES-DOCUSATE SODIUM 8.6-50 MG PO TABS: 2.0000 | ORAL_TABLET | ORAL | 0 refills | Status: DC

## 2019-04-15 MED ORDER — LORATADINE 10 MG PO TABS: 10.0000 mg | ORAL_TABLET | Freq: Every day | ORAL | 0 refills | Status: DC

## 2019-04-15 MED ORDER — ENALAPRIL MALEATE 5 MG PO TABS: 5.0000 mg | ORAL_TABLET | Freq: Every day | ORAL | 0 refills | Status: AC

## 2019-04-15 MED ORDER — IBUPROFEN 800 MG PO TABS: 800.0000 mg | ORAL_TABLET | Freq: Three times a day (TID) | ORAL | 0 refills | Status: AC

## 2019-04-15 MED ORDER — INSULIN GLARGINE 100 UNIT/ML ~~LOC~~ SOLN: 10.0000 [IU] | SUBCUTANEOUS | 11 refills | Status: AC

## 2019-04-15 MED ORDER — LORATADINE 10 MG PO TABS: 10.0000 mg | ORAL_TABLET | Freq: Every day | ORAL | 0 refills | Status: AC

## 2019-04-15 MED ORDER — METFORMIN HCL 500 MG PO TABS: 500.0000 mg | ORAL_TABLET | Freq: Two times a day (BID) | ORAL | 3 refills | Status: DC

## 2019-04-15 MED ORDER — ENALAPRIL MALEATE 5 MG PO TABS: 5.0000 mg | ORAL_TABLET | Freq: Every day | ORAL | 0 refills | Status: DC

## 2019-04-15 MED ORDER — INSULIN GLARGINE 100 UNIT/ML ~~LOC~~ SOLN: 10.0000 [IU] | SUBCUTANEOUS | 11 refills | Status: DC

## 2019-04-15 MED ORDER — SENNOSIDES-DOCUSATE SODIUM 8.6-50 MG PO TABS: 2.0000 | ORAL_TABLET | ORAL | 0 refills | Status: AC

## 2019-04-15 MED FILL — ENALAPRIL MALEATE 5 MG TABS: 5 | 90 days supply | Qty: 90 | Fill #0

## 2019-04-15 MED FILL — metFORMIN HCL 500 MG TABS: 500 | 30 days supply | Qty: 60 | Fill #0

## 2019-04-15 MED FILL — HYDROCODON-APAP 5-325: 5-325 | 4 days supply | Qty: 25 | Fill #0

## 2019-04-15 MED FILL — LANTUS SOLOSTAR 100 UNITS/M: 100 | 30 days supply | Qty: 3 | Fill #0

## 2019-04-15 MED FILL — SENEXON-S 8.6-50 MG TABS: 8.6-50 | 15 days supply | Qty: 30 | Fill #0

## 2019-04-15 MED FILL — LORATADINE 10 MG TABLET: 10 | 90 days supply | Qty: 90 | Fill #0

## 2019-04-15 MED FILL — PENTIPS 31G X 8 MM MISC: 31G X 8 MM | 30 days supply | Qty: 100 | Fill #0

## 2019-04-15 MED FILL — IBUPROFEN 800 MG TAB: 800 | 10 days supply | Qty: 30 | Fill #0

## 2019-04-15 NOTE — Progress Notes (Signed)
Inpatient Diabetes Program Recommendations  AACE/ADA: New Consensus Statement on Inpatient Glycemic Control (2015)  Target Ranges:  Prepandial:   less than 140 mg/dL      Peak postprandial:   less than 180 mg/dL (1-2 hours)      Critically ill patients:  140 - 180 mg/dL   Lab Results  Component Value Date   GLUCAP 97 04/15/2019   HGBA1C 8.8 (H) 04/13/2019    Review of Glycemic Control Results for Ellen Martin, Ellen Martin (MRN [id number]) as of 04/15/2019 09:01  Ref. Range 04/14/2019 16:44 04/14/2019 19:30 04/14/2019 22:23 04/15/2019 08:49  Glucose-Capillary Latest Ref Range: 70 - 99 mg/dL 757 (H) 322 (H) 567 (H) 97   Diabetes history: Type 2 DM Outpatient Diabetes medications: Metformin 500 mg BID Current orders for Inpatient glycemic control: Novolog 0-15 units TID, Novolog 0-5 units QHS, Lantus 10 units QD, Metformin 500 mg BID  Inpatient Diabetes Program Recommendations:    In preparation for discharge:  - Increasing metformin 1000 mg BID. -Add Glyburide 5 mg BID  Thanks, Lujean Rave, MSN, RNC-OB Diabetes Coordinator [phone] (8a-5p)

## 2019-04-15 NOTE — Progress Notes (Signed)
Nutrition Consult to review basics of Diabetic Diet  Copy of Diabetic diet ( AND Carbohydrate counting for people with Diabetes)presented to pt. Reviewed the foods lists that contained foods high in CHO's. Suggested to pt that she limit CHO portions to 4 per meal. Pt with no questions, and indicated she did not require diet education at this time. Pt may be more interested in further education as an outpt.

## 2019-04-15 NOTE — Progress Notes (Signed)
CSW received phone call from Maple Hudson with [facility] CPS informing he was assigned to report. Per Dorene Sorrow, he would be at the hospital at [facility] to meet with MOB and complete assessment. At this time, barriers to discharge. CSW awaiting CPS disposition.  CSW has communicated this to MOB who denied any questions or concerns regarding meeting. CSW aware consult placed for follow up care in the event patient's Medicaid runs out. CSW discussed this with MOB and MOB reported she has active Medicaid and that it will not be running out soon. MOB did report she does not currently have a PCP. CSW has provided MOB with a list of PCPs who accept Medicaid in [facility]. Per MOB, she feels comfortable reaching out to schedule an appointment.  Lear Ng, LCSW [facility] [phone]

## 2019-04-17 ENCOUNTER — Inpatient Hospital Stay (HOSPITAL_COMMUNITY)
Admission: AD | Admit: 2019-04-17 | Payer: Medicaid Other | Source: Home / Self Care | Admitting: Obstetrics and Gynecology

## 2019-04-17 ENCOUNTER — Inpatient Hospital Stay (HOSPITAL_COMMUNITY): Payer: Medicaid Other

## 2019-04-21 ENCOUNTER — Encounter: Payer: Self-pay | Admitting: Obstetrics and Gynecology

## 2019-04-21 ENCOUNTER — Ambulatory Visit (INDEPENDENT_AMBULATORY_CARE_PROVIDER_SITE_OTHER): Payer: Medicaid Other | Admitting: Obstetrics and Gynecology

## 2019-04-21 ENCOUNTER — Ambulatory Visit (INDEPENDENT_AMBULATORY_CARE_PROVIDER_SITE_OTHER): Payer: Medicaid Other | Admitting: Licensed Clinical Social Worker

## 2019-04-21 ENCOUNTER — Other Ambulatory Visit: Payer: Self-pay

## 2019-04-21 VITALS — BP 128/81 | HR 94 | Ht 69.0 in | Wt 286.0 lb

## 2019-04-21 DIAGNOSIS — Z9889 Other specified postprocedural states: Secondary | ICD-10-CM

## 2019-04-21 DIAGNOSIS — F4325 Adjustment disorder with mixed disturbance of emotions and conduct: Secondary | ICD-10-CM

## 2019-04-21 NOTE — Progress Notes (Signed)
One week PP incision check/vac. Removal.

## 2019-04-21 NOTE — Progress Notes (Signed)
34 yo [id number] s/p RCS with BTL on 04/11/19 here for incision check. Patient reports feeling well and is without complaints. She reports receiving help from her mother and denies any concerns regarding postpartum depression. Patient was discharged from the hospital on enalapril due to [facility]  Blood pressure 128/81, pulse 94, height 5\' 9"  (1.753 m), weight 286 lb (129.7 kg), last menstrual period 07/18/2018, not currently breastfeeding. GENERAL: Well-developed, well-nourished female in no acute distress. Patient with a right black eye and bruising on chin which she attributes to a fall ABDOMEN: Soft, nontender, nondistended. No organomegaly. Incision: prevena wound vac removed and incision is healing well. One area measuring 3 cm of skin separation on left aspect of the incision with good granulation tissue. No erythema or induration EXTREMITIES: No cyanosis, clubbing, or edema, 2+ distal pulses.  A/P 34 yo POD#10 s/p RCS with BTL here for incision check - Continue enalapril - Wound care instructions reviewed - RTC in 4 weeks for postpartum care

## 2019-04-21 NOTE — BH Specialist Note (Signed)
Integrated Behavioral Health Initial Visit  MRN: [id number] Name: Ellen Martin  Number of Integrated Behavioral Health Clinician visits:: 1 Session Start time: 2:45pm  Session End time: 3:00pm  Total time: 15 mins   Type of Service: Integrated Behavioral Health- Individual  Interpretor:no  Interpretor Name and Language: none   Warm Hand Off Completed.       SUBJECTIVE: Ellen Martin is a 34 y.o. female  Patient was referred by Ellen Martin  for history of behavioral health concerns . Patient reports the following symptoms/co  Duration of problem: 1 weeks; Severity of problem: mild  OBJECTIVE: Mood: good  and Affect: Normal  Risk of harm to self or others: none at this time   LIFE CONTEXT: Family and Social: Resides in [place] Ellen Martin mother currently visiting from [place]  School/Work: Unemployed  Self-Care: taking naps  Life Changes: short intervals between pregnancy  GOALS ADDRESSED: Patient will: 1. Reduce symptoms of: postpartum depression  2. Increase knowledge of postpartum depression symptoms   3. Demonstrate ability to: self manage symptoms   INTERVENTIONS: Interventions utilized: brief supportive counseling   Standardized Assessments completed: none    ASSESSMENT: Patient currently self managing symptoms and [facility]    Patient may benefit from outpatient therapy  PLAN: 1. Follow up with behavioral health clinician on : as needed  2. Behavioral recommendations: continue to attend [facility]  3. Referral(s): none 4. "From scale of 1-10, how likely are you to follow plan?":   Ellen Saxon, LCSW

## 2019-04-24 ENCOUNTER — Emergency Department (HOSPITAL_COMMUNITY)
Admission: EM | Admit: 2019-04-24 | Discharge: 2019-04-24 | Disposition: A | Discharge status: Home / Self Care | Payer: Medicaid Other | Attending: Emergency Medicine | Admitting: Emergency Medicine

## 2019-04-24 ENCOUNTER — Encounter (HOSPITAL_COMMUNITY): Payer: Self-pay

## 2019-04-24 ENCOUNTER — Inpatient Hospital Stay (HOSPITAL_COMMUNITY): Admit: 2019-04-24 | Payer: Medicaid Other | Admitting: Obstetrics and Gynecology

## 2019-04-24 ENCOUNTER — Other Ambulatory Visit: Payer: Self-pay

## 2019-04-24 ENCOUNTER — Encounter (HOSPITAL_COMMUNITY): Payer: Self-pay | Admitting: *Deleted

## 2019-04-24 DIAGNOSIS — Z3A00 Weeks of gestation of pregnancy not specified: Secondary | ICD-10-CM | POA: Diagnosis not present

## 2019-04-24 DIAGNOSIS — O900 Disruption of cesarean delivery wound: Secondary | ICD-10-CM | POA: Diagnosis not present

## 2019-04-24 DIAGNOSIS — Z5321 Procedure and treatment not carried out due to patient leaving prior to being seen by health care provider: Secondary | ICD-10-CM | POA: Diagnosis not present

## 2019-04-24 LAB — CBC WITH DIFFERENTIAL/PLATELET
Abs Immature Granulocytes: 0.04 10*3/uL (ref 0.00–0.07)
Basophils Absolute: 0.1 10*3/uL (ref 0.0–0.1)
Basophils Relative: 0 %
Eosinophils Absolute: 0.2 10*3/uL (ref 0.0–0.5)
Eosinophils Relative: 2 %
HCT: 36.6 % (ref 36.0–46.0)
Hemoglobin: 11.3 g/dL — ABNORMAL LOW (ref 12.0–15.0)
Immature Granulocytes: 0 %
Lymphocytes Relative: 26 %
Lymphs Abs: 2.9 10*3/uL (ref 0.7–4.0)
MCH: 25.8 pg — ABNORMAL LOW (ref 26.0–34.0)
MCHC: 30.9 g/dL (ref 30.0–36.0)
MCV: 83.6 fL (ref 80.0–100.0)
Monocytes Absolute: 0.6 10*3/uL (ref 0.1–1.0)
Monocytes Relative: 6 %
Neutro Abs: 7.4 10*3/uL (ref 1.7–7.7)
Neutrophils Relative %: 66 %
Platelets: 461 10*3/uL — ABNORMAL HIGH (ref 150–400)
RBC: 4.38 MIL/uL (ref 3.87–5.11)
RDW: 15 % (ref 11.5–15.5)
WBC: 11.2 10*3/uL — ABNORMAL HIGH (ref 4.0–10.5)
nRBC: 0 % (ref 0.0–0.2)

## 2019-04-24 LAB — COMPREHENSIVE METABOLIC PANEL
ALT: 13 U/L (ref 0–44)
AST: 15 U/L (ref 15–41)
Albumin: 3.1 g/dL — ABNORMAL LOW (ref 3.5–5.0)
Alkaline Phosphatase: 92 U/L (ref 38–126)
Anion gap: 11 (ref 5–15)
BUN: 12 mg/dL (ref 6–20)
CO2: 25 mmol/L (ref 22–32)
Calcium: 8.9 mg/dL (ref 8.9–10.3)
Chloride: 101 mmol/L (ref 98–111)
Creatinine, Ser: 0.55 mg/dL (ref 0.44–1.00)
GFR calc Af Amer: >60 mL/min (ref >60)
GFR calc non Af Amer: >60 mL/min (ref >60)
Glucose, Bld: 147 mg/dL — ABNORMAL HIGH (ref 70–99)
Potassium: 4 mmol/L (ref 3.5–5.1)
Sodium: 137 mmol/L (ref 135–145)
Total Bilirubin: 0.2 mg/dL — ABNORMAL LOW (ref 0.3–1.2)
Total Protein: 6.7 g/dL (ref 6.5–8.1)

## 2019-04-24 SURGERY — Surgical Case
Anesthesia: Spinal

## 2019-04-24 MED ORDER — SODIUM CHLORIDE 0.9% FLUSH: 3.0000 mL | Freq: Once | INTRAVENOUS | Status: DC

## 2019-04-24 NOTE — ED Triage Notes (Signed)
Pt reports she had a c section on the 7th, bandage was removed on Monday. Pt says that her incision is partially opened and she is having purulent drainage that she noticed today. Denies fevers.

## 2019-04-27 ENCOUNTER — Ambulatory Visit: Payer: Medicaid Other

## 2019-05-13 ENCOUNTER — Ambulatory Visit: Payer: Medicaid Other | Admitting: Obstetrics and Gynecology

## 2019-05-25 ENCOUNTER — Emergency Department (HOSPITAL_COMMUNITY)
Admission: EM | Admit: 2019-05-25 | Discharge: 2019-05-25 | Disposition: A | Discharge status: Home / Self Care | Payer: Medicaid Other | Attending: Emergency Medicine | Admitting: Emergency Medicine

## 2019-05-25 ENCOUNTER — Encounter (HOSPITAL_COMMUNITY): Payer: Self-pay | Admitting: Emergency Medicine

## 2019-05-25 ENCOUNTER — Other Ambulatory Visit: Payer: Self-pay

## 2019-05-25 DIAGNOSIS — F17210 Nicotine dependence, cigarettes, uncomplicated: Secondary | ICD-10-CM | POA: Insufficient documentation

## 2019-05-25 DIAGNOSIS — M545 Low back pain: Secondary | ICD-10-CM | POA: Diagnosis not present

## 2019-05-25 DIAGNOSIS — Z79899 Other long term (current) drug therapy: Secondary | ICD-10-CM | POA: Insufficient documentation

## 2019-05-25 DIAGNOSIS — Z794 Long term (current) use of insulin: Secondary | ICD-10-CM | POA: Diagnosis not present

## 2019-05-25 DIAGNOSIS — E119 Type 2 diabetes mellitus without complications: Secondary | ICD-10-CM | POA: Insufficient documentation

## 2019-05-25 DIAGNOSIS — R300 Dysuria: Secondary | ICD-10-CM | POA: Diagnosis not present

## 2019-05-25 DIAGNOSIS — I10 Essential (primary) hypertension: Secondary | ICD-10-CM | POA: Insufficient documentation

## 2019-05-25 DIAGNOSIS — R509 Fever, unspecified: Secondary | ICD-10-CM | POA: Diagnosis present

## 2019-05-25 DIAGNOSIS — N12 Tubulo-interstitial nephritis, not specified as acute or chronic: Secondary | ICD-10-CM

## 2019-05-25 LAB — I-STAT BETA HCG BLOOD, ED (MC, WL, AP ONLY): I-stat hCG, quantitative: <5 m[IU]/mL (ref <5)

## 2019-05-25 LAB — COMPREHENSIVE METABOLIC PANEL
ALT: 21 U/L (ref 0–44)
AST: 32 U/L (ref 15–41)
Albumin: 3.8 g/dL (ref 3.5–5.0)
Alkaline Phosphatase: 132 U/L — ABNORMAL HIGH (ref 38–126)
Anion gap: 14 (ref 5–15)
BUN: 17 mg/dL (ref 6–20)
CO2: 27 mmol/L (ref 22–32)
Calcium: 9.1 mg/dL (ref 8.9–10.3)
Chloride: 92 mmol/L — ABNORMAL LOW (ref 98–111)
Creatinine, Ser: 0.71 mg/dL (ref 0.44–1.00)
GFR calc Af Amer: >60 mL/min (ref >60)
GFR calc non Af Amer: >60 mL/min (ref >60)
Glucose, Bld: 213 mg/dL — ABNORMAL HIGH (ref 70–99)
Potassium: 4.3 mmol/L (ref 3.5–5.1)
Sodium: 133 mmol/L — ABNORMAL LOW (ref 135–145)
Total Bilirubin: 0.6 mg/dL (ref 0.3–1.2)
Total Protein: 8.3 g/dL — ABNORMAL HIGH (ref 6.5–8.1)

## 2019-05-25 LAB — URINALYSIS, ROUTINE W REFLEX MICROSCOPIC
Bilirubin Urine: NEGATIVE
Glucose, UA: NEGATIVE mg/dL
Ketones, ur: NEGATIVE mg/dL
Nitrite: POSITIVE — AB
Protein, ur: 100 mg/dL — AB
Specific Gravity, Urine: >1.03 — ABNORMAL HIGH (ref 1.005–1.030)
pH: 5.5 (ref 5.0–8.0)

## 2019-05-25 LAB — CBC WITH DIFFERENTIAL/PLATELET
Abs Immature Granulocytes: 0.08 10*3/uL — ABNORMAL HIGH (ref 0.00–0.07)
Basophils Absolute: 0 10*3/uL (ref 0.0–0.1)
Basophils Relative: 0 %
Eosinophils Absolute: 0 10*3/uL (ref 0.0–0.5)
Eosinophils Relative: 0 %
HCT: 42.7 % (ref 36.0–46.0)
Hemoglobin: 13.1 g/dL (ref 12.0–15.0)
Immature Granulocytes: 1 %
Lymphocytes Relative: 10 %
Lymphs Abs: 1.3 10*3/uL (ref 0.7–4.0)
MCH: 25.3 pg — ABNORMAL LOW (ref 26.0–34.0)
MCHC: 30.7 g/dL (ref 30.0–36.0)
MCV: 82.6 fL (ref 80.0–100.0)
Monocytes Absolute: 1.4 10*3/uL — ABNORMAL HIGH (ref 0.1–1.0)
Monocytes Relative: 10 %
Neutro Abs: 10.7 10*3/uL — ABNORMAL HIGH (ref 1.7–7.7)
Neutrophils Relative %: 79 %
Platelets: 286 10*3/uL (ref 150–400)
RBC: 5.17 MIL/uL — ABNORMAL HIGH (ref 3.87–5.11)
RDW: 16.7 % — ABNORMAL HIGH (ref 11.5–15.5)
WBC: 13.5 10*3/uL — ABNORMAL HIGH (ref 4.0–10.5)
nRBC: 0 % (ref 0.0–0.2)

## 2019-05-25 LAB — URINALYSIS, MICROSCOPIC (REFLEX): RBC / HPF: NONE SEEN RBC/hpf (ref 0–5)

## 2019-05-25 MED ORDER — SODIUM CHLORIDE 0.9% FLUSH
3.0000 mL | Freq: Once | INTRAVENOUS | Status: AC
  Administered 2019-05-25: 3 mL via INTRAVENOUS

## 2019-05-25 MED ORDER — CEPHALEXIN 500 MG PO CAPS: 500.0000 mg | ORAL_CAPSULE | Freq: Four times a day (QID) | ORAL | 0 refills | Status: DC

## 2019-05-25 MED ORDER — ACETAMINOPHEN 325 MG PO TABS
650.0000 mg | ORAL_TABLET | Freq: Once | ORAL | Status: AC
  Administered 2019-05-25: 650 mg via ORAL
  Filled 2019-05-25: qty 2

## 2019-05-25 MED ORDER — SODIUM CHLORIDE 0.9 % IV SOLN
1.0000 g | Freq: Once | INTRAVENOUS | Status: AC
  Administered 2019-05-25: 1 g via INTRAVENOUS
  Filled 2019-05-25: qty 10

## 2019-05-25 MED ORDER — SODIUM CHLORIDE 0.9 % IV BOLUS
1000.0000 mL | Freq: Once | INTRAVENOUS | Status: AC
  Administered 2019-05-25: 15:00:00 1000 mL via INTRAVENOUS

## 2019-05-25 MED ORDER — MORPHINE SULFATE (PF) 4 MG/ML IV SOLN
4.0000 mg | Freq: Once | INTRAVENOUS | Status: AC
  Administered 2019-05-25: 4 mg via INTRAVENOUS
  Filled 2019-05-25: qty 1

## 2019-05-25 NOTE — ED Notes (Signed)
Pt ambulatory to RR independently  

## 2019-05-25 NOTE — ED Provider Notes (Signed)
[facility] DEPT Provider Note   CSN: [id number] Arrival date & time: 05/25/19  1422     History Chief Complaint  Patient presents with  . Fever  . Back Pain    Ellen Martin is a 34 y.o. female.  She has a history of hypertension and diabetes.  Complaining of 3 days of fever up to 103.  Low back pain. No flank pain. Dysuria.  No hematuria. Denies any headache sore throat cough nausea vomiting diarrhea.  No rashes or swollen joints.  Smokes cigarettes denies alcohol denies any IV drug use.  Last took aspirin this morning for her fever.  The history is provided by the patient.  Fever Max temp prior to arrival:  103 Temp source:  Oral Severity:  Moderate Onset quality:  Gradual Timing:  Intermittent Progression:  Unchanged Chronicity:  New Relieved by:  Nothing Worsened by:  Nothing Ineffective treatments:  Aspirin Associated symptoms: dysuria   Associated symptoms: no chills, no confusion, no cough, no diarrhea, no headaches, no nausea, no rash, no rhinorrhea and no vomiting        Past Medical History:  Diagnosis Date  . Anxiety   . Blood transfusion without reported diagnosis   . Depression   . Diabetes mellitus without complication (HCC)    Type 2  . Hypertension     Patient Active Problem List   Diagnosis Date Noted  . Normal labor 04/12/2019  . H/O tubal ligation 04/12/2019  . GBS (group B Streptococcus carrier), +RV culture, currently pregnant 03/30/2019  . Supervision of high risk pregnancy, antepartum 03/18/2019  . Nausea and vomiting of pregnancy, antepartum 01/29/2019  . Suicidal ideations   . Adjustment disorder with mixed disturbance of emotions and conduct 05/21/2018  . MDD (major depressive disorder), single episode, moderate ([place]) 05/21/2018  . Suicide attempt ([place])   . History of 2 cesarean sections 03/07/2018  . Pre-existing type 2 diabetes affecting pregnancy, antepartum 01/30/2018  . Gallstone 01/30/2018  .  Gestational hypertension 01/28/2018    Past Surgical History:  Procedure Laterality Date  . CESAREAN SECTION    . CESAREAN SECTION N/A 04/12/2019   Procedure: CESAREAN SECTION;  Surgeon: Chancy Milroy, MD;  Location: [facility];  Service: Obstetrics;  Laterality: N/A;  Bilateral Tubal Ligation  . CESAREAN SECTION MULTI-GESTATIONAL N/A 03/07/2018   Procedure: CESAREAN SECTION MULTI-GESTATIONAL;  Surgeon: Sloan Leiter, MD;  Location: [facility];  Service: Obstetrics;  Laterality: N/A;  . NO PAST SURGERIES       OB History    Gravida  8   Para  5   Term  5   Preterm      AB  3   Living  6     SAB  3   TAB      Ectopic      Multiple  1   Live Births  6           Family History  Problem Relation Age of Onset  . Hypertension Mother   . Diabetes Mother   . Arthritis Mother   . Obesity Mother   . Hypertension Maternal Grandmother   . Diabetes Maternal Grandmother   . Arthritis Maternal Grandmother   . COPD Maternal Uncle   . Miscarriages / [place] Other     Social History   Tobacco Use  . Smoking status: Current Every Day Smoker    Packs/day: 0.25    Types: Cigarettes  . Smokeless tobacco: Never Used  .  Tobacco comment: 1-2 per day  Substance Use Topics  . Alcohol use: Not Currently  . Drug use: Not Currently    Types: Marijuana    Comment: "a while ago"    Home Medications Prior to Admission medications   Medication Sig Start Date End Date Taking? Authorizing Provider  enalapril (VASOTEC) 5 MG tablet Take 1 tablet (5 mg total) by mouth daily. 04/15/19   Chancy Milroy, MD  HYDROcodone-acetaminophen (NORCO/VICODIN) 5-325 MG tablet Take 1-2 tablets by mouth every 4 (four) hours as needed for moderate pain. 04/15/19   Fair, Marin Shutter, MD  ibuprofen (ADVIL) 800 MG tablet Take 1 tablet (800 mg total) by mouth every 8 (eight) hours. 04/15/19   Chancy Milroy, MD  insulin glargine (LANTUS) 100 UNIT/ML injection Inject 0.1 mLs (10 Units total)  into the skin daily. 04/15/19   Chancy Milroy, MD  loratadine (CLARITIN) 10 MG tablet Take 1 tablet (10 mg total) by mouth daily. 04/15/19   Chancy Milroy, MD  metFORMIN (GLUCOPHAGE) 500 MG tablet Take 1 tablet (500 mg total) by mouth 2 (two) times daily with a meal. For diabetes 04/15/19   Chancy Milroy, MD  senna-docusate (SENOKOT-S) 8.6-50 MG tablet Take 2 tablets by mouth daily. 04/16/19   Chancy Milroy, MD    Allergies    Azithromycin  Review of Systems   Review of Systems  Constitutional: Positive for fever. Negative for chills.  HENT: Negative for rhinorrhea.   Eyes: Negative for visual disturbance.  Respiratory: Negative for cough.   Gastrointestinal: Negative for diarrhea, nausea and vomiting.  Genitourinary: Positive for dysuria. Negative for hematuria.  Musculoskeletal: Positive for back pain.  Skin: Negative for rash.  Neurological: Negative for headaches.  Psychiatric/Behavioral: Negative for confusion.    Physical Exam Updated Vital Signs BP 126/87 (BP Location: Right Arm)   Pulse (!) 102   Temp 99.8 F (37.7 C) (Oral)   Resp 19   LMP 07/18/2018   SpO2 99%   Physical Exam Vitals and nursing note reviewed.  Constitutional:      General: She is not in acute distress.    Appearance: She is well-developed.  HENT:     Head: Normocephalic and atraumatic.  Eyes:     Conjunctiva/sclera: Conjunctivae normal.  Cardiovascular:     Rate and Rhythm: Regular rhythm. Tachycardia present.     Heart sounds: No murmur.  Pulmonary:     Effort: Pulmonary effort is normal. No respiratory distress.     Breath sounds: Normal breath sounds.  Abdominal:     Palpations: Abdomen is soft.     Tenderness: There is no abdominal tenderness. There is no right CVA tenderness or left CVA tenderness.  Musculoskeletal:        General: No deformity or signs of injury. Normal range of motion.     Cervical back: Neck supple.  Skin:    General: Skin is warm and dry.      Capillary Refill: Capillary refill takes less than 2 seconds.  Neurological:     General: No focal deficit present.     Mental Status: She is alert.     Gait: Gait normal.     ED Results / Procedures / Treatments   Labs (all labs ordered are listed, but only abnormal results are displayed) Labs Reviewed  URINALYSIS, ROUTINE W REFLEX MICROSCOPIC - Abnormal; Notable for the following components:      Result Value   APPearance CLOUDY (*)    Specific  Gravity, Urine >1.030 (*)    Hgb urine dipstick TRACE (*)    Protein, ur 100 (*)    Nitrite POSITIVE (*)    Leukocytes,Ua SMALL (*)    All other components within normal limits  COMPREHENSIVE METABOLIC PANEL - Abnormal; Notable for the following components:   Sodium 133 (*)    Chloride 92 (*)    Glucose, Bld 213 (*)    Total Protein 8.3 (*)    Alkaline Phosphatase 132 (*)    All other components within normal limits  CBC WITH DIFFERENTIAL/PLATELET - Abnormal; Notable for the following components:   WBC 13.5 (*)    RBC 5.17 (*)    MCH 25.3 (*)    RDW 16.7 (*)    Neutro Abs 10.7 (*)    Monocytes Absolute 1.4 (*)    Abs Immature Granulocytes 0.08 (*)    All other components within normal limits  URINALYSIS, MICROSCOPIC (REFLEX) - Abnormal; Notable for the following components:   Bacteria, UA FEW (*)    All other components within normal limits  URINE CULTURE  CULTURE, BLOOD (ROUTINE X 2)  CULTURE, BLOOD (ROUTINE X 2)  I-STAT BETA HCG BLOOD, ED ([facility], [facility], [facility] ONLY)    EKG None  Radiology No results found.  Procedures Procedures (including critical care time)  Medications Ordered in ED Medications  sodium chloride flush (NS) 0.9 % injection 3 mL (3 mLs Intravenous Given 05/25/19 1529)  sodium chloride 0.9 % bolus 1,000 mL (0 mLs Intravenous Stopped 05/25/19 1655)  morphine 4 MG/ML injection 4 mg (4 mg Intravenous Given 05/25/19 1525)  acetaminophen (TYLENOL) tablet 650 mg (650 mg Oral Given 05/25/19 1529)  cefTRIAXone  (ROCEPHIN) 1 g in sodium chloride 0.9 % 100 mL IVPB (0 g Intravenous Stopped 05/25/19 1706)    ED Course  I have reviewed the triage vital signs and the nursing notes.  Pertinent labs & imaging results that were available during my care of the patient were reviewed by me and considered in my medical decision making (see chart for details).  Clinical Course as of May 24 1905  Mon May 25, 2019  1617 Differential diagnosis includes pyelonephritis, UTI, renal colic   [MB]  1660 Lab work showing mild elevation of white blood cell count.  Glucose slightly elevated at 213.  Alk phos elevated at 132 with rest of the LFTs are normal.  Urinalysis is nitrite +20 1-50 whites.  This is likely cause of her symptoms.  She is received some fluids and pain medication and have ordered her some IV ceftriaxone.  Urine is cultured.  Reviewed results with patient and she is comfortable plan, pain is controlled   [MB]    Clinical Course User Index [MB] Hayden Rasmussen, MD   MDM Rules/Calculators/A&P                     Ellen Martin was evaluated in Emergency Department on 05/25/2019 for the symptoms described in the history of present illness. She was evaluated in the context of the global COVID-19 pandemic, which necessitated consideration that the patient might be at risk for infection with the SARS-CoV-2 virus that causes COVID-19. Institutional protocols and algorithms that pertain to the evaluation of patients at risk for COVID-19 are in a state of rapid change based on information released by regulatory bodies including the CDC and federal and state organizations. These policies and algorithms were followed during the patient's care in the ED.   Final Clinical Impression(s) /  ED Diagnoses Final diagnoses:  Pyelonephritis    Rx / DC Orders ED Discharge Orders         Ordered    cephALEXin (KEFLEX) 500 MG capsule  4 times daily     05/25/19 1619           Hayden Rasmussen, MD 05/25/19 [phone]

## 2019-05-25 NOTE — ED Triage Notes (Signed)
Patient here from home reporting fever, lower back pain, and burning during urination x3 days.

## 2019-05-25 NOTE — ED Notes (Signed)
Pt verbalizes understanding of DC instructions. Pt belongings returned and is ambulatory out of ED.   Pt using ED phone to call ride

## 2019-05-25 NOTE — Discharge Instructions (Signed)
You were seen in the emergency department for fever low back pain and burning when you urinate.  You had blood work and a urinalysis that showed a kidney infection.  Please drink plenty of fluids and use Tylenol and ibuprofen for pain and fever.  Finish your antibiotics.  A urine culture was sent and we may call you back and switch her antibiotics if the infection is resistant to what we prescribed you.  Follow-up with your doctor and return to the emergency department if any worsening symptoms.

## 2019-05-27 LAB — URINE CULTURE: Culture: >=100000 — AB

## 2019-05-28 ENCOUNTER — Telehealth: Payer: Self-pay

## 2019-05-28 NOTE — Telephone Encounter (Signed)
Post ED Visit - Positive Culture Follow-up  Culture report reviewed by antimicrobial stewardship pharmacist: [facility] []  , Pharm.D. []  Enzo Bi, Pharm.D., BCPS AQ-ID []  , Pharm.D., BCPS []  Celedonio Miyamoto, Pharm.D., BCPS []  [place], Garvin Fila.D., BCPS, AAHIVP []  , Pharm.D., BCPS, AAHIVP []  Georgina Pillion, PharmD, BCPS []  , PharmD, BCPS []  [place], PharmD, BCPS []  [address], PharmD []  , PharmD, BCPS []  Estella Husk, PharmD  Pharmacy Team []  Lysle Pearl, PharmD []  , PharmD []  Phillips Climes, PharmD []  , Rph []  Agapito Games) , PharmD []  Verlan Friends, PharmD []  , PharmD []  Mervyn Gay, PharmD []  , PharmD []  Vinnie Level, PharmD []  [facility], PharmD []  , PharmD [x]  Len Childs, PharmD   Positive urine culture Treated with Cephalexin, organism sensitive to the same and no further patient follow-up is required at this time.  05/28/2019, 11:21 AM

## 2019-05-30 LAB — CULTURE, BLOOD (ROUTINE X 2)
Culture: NO GROWTH
Culture: NO GROWTH
Special Requests: ADEQUATE
Special Requests: ADEQUATE

## 2019-09-01 ENCOUNTER — Other Ambulatory Visit: Payer: Self-pay

## 2019-09-01 ENCOUNTER — Encounter (HOSPITAL_COMMUNITY): Payer: Self-pay | Admitting: Emergency Medicine

## 2019-09-01 ENCOUNTER — Ambulatory Visit (HOSPITAL_COMMUNITY)
Admission: EM | Admit: 2019-09-01 | Discharge: 2019-09-01 | Disposition: A | Discharge status: Home / Self Care | Payer: Medicaid Other | Attending: Family Medicine | Admitting: Family Medicine

## 2019-09-01 DIAGNOSIS — M545 Low back pain, unspecified: Secondary | ICD-10-CM

## 2019-09-01 DIAGNOSIS — R202 Paresthesia of skin: Secondary | ICD-10-CM

## 2019-09-01 MED ORDER — PREDNISONE 10 MG (21) PO TBPK: ORAL_TABLET | Freq: Every day | ORAL | 0 refills | Status: AC

## 2019-09-01 NOTE — ED Provider Notes (Signed)
[facility]   [id number] 09/01/19 Arrival Time: 1243  ASSESSMENT & PLAN:  1. Acute right-sided low back pain without sciatica   2. Paresthesia of right leg     Able to ambulate here and hemodynamically stable. No indication for urgent imaging of back at this time. Without gross neurologic abnormalities.Discussed.  Begin trial of: Meds ordered this encounter  Medications  . predniSONE (STERAPRED UNI-PAK 21 TAB) 10 MG (21) TBPK tablet    Sig: Take by mouth daily. Take as directed.    Dispense:  21 tablet    Refill:  0   Instructed to monitor blood sugars while taking.  Encourage ROM/movement as tolerated.  Recommend:  Follow-up Information    [facility].   Why: If worsening or failing to improve as anticipated. Contact information: [address] [place] [address] [phone]              Reviewed expectations re: course of current medical issues. Questions answered. Outlined signs and symptoms indicating need for more acute intervention. Patient verbalized understanding. After Visit Summary given.   SUBJECTIVE: History from: patient.  Ellen Martin is a 34 y.o. female who presents with complaint of intermittent right sided low back pain with intermittent LLE paresthesia. H/O mild and similar symptoms in the past. Ambulatory without difficulty. First noted symptoms approx 1 weeks ago. Normal bowel and bladder habits. No OTC tx. No trauma.  Reports no chronic steroid use, fevers, IV drug use, or recent back surgeries or procedures.    OBJECTIVE:  Vitals:   09/01/19 1304  BP: 111/76  Pulse: 80  Resp: 20  Temp: 97.6 F (36.4 C)  TempSrc: Oral  SpO2: 100%    General appearance: alert; no distress HEENT: [place]; AT Neck: supple with FROM Lungs: unlabored respirations; speaks full sentences without difficulty Abdomen: soft, non-tender; non-distended Back: mild  and poorly localized  tenderness to palpation over R lumbar area; FROM at waist; bruising: none; without midline tenderness Extremities: without edema; symmetrical without gross deformities; normal ROM of bilateral LE Skin: warm and dry Neurologic: normal gait; normal sensation and strength of RLE Psychological: alert and cooperative; normal mood and affect    Allergies  Allergen Reactions  . Azithromycin Hives    Hives without SOB or wheezing after given Azithromycin for cesarean    Past Medical History:  Diagnosis Date  . Anxiety   . Blood transfusion without reported diagnosis   . Depression   . Diabetes mellitus without complication (HCC)    Type 2  . Hypertension    Social History   Socioeconomic History  . Marital status: Single    Spouse name: Not on file  . Number of children: Not on file  . Years of education: Not on file  . Highest education level: Not on file  Occupational History  . Occupation: unemployed  Tobacco Use  . Smoking status: Current Every Day Smoker    Packs/day: 0.25    Types: Cigarettes  . Smokeless tobacco: Never Used  . Tobacco comment: 1-2 per day  Vaping Use  . Vaping Use: Never used  Substance and Sexual Activity  . Alcohol use: Not Currently  . Drug use: Not Currently    Types: Marijuana    Comment: "a while ago"  . Sexual activity: Yes  Other Topics Concern  . Not on file  Social History Narrative  . Not on file   Social Determinants of Health   Financial Resource  Strain:   . Difficulty of Paying Living Expenses:   Food Insecurity:   . Worried About Programme researcher, broadcasting/film/video in the Last Year:   . Barista in the Last Year:   Transportation Needs:   . Freight forwarder (Medical):   Marland Kitchen Lack of Transportation (Non-Medical):   Physical Activity:   . Days of Exercise per Week:   . Minutes of Exercise per Session:   Stress:   . Feeling of Stress :   Social Connections:   . Frequency of Communication with Friends and Family:   . Frequency  of Social Gatherings with Friends and Family:   . Attends Religious Services:   . Active Member of Clubs or Organizations:   . Attends Banker Meetings:   Marland Kitchen Marital Status:   Intimate Partner Violence:   . Fear of Current or Ex-Partner:   . Emotionally Abused:   Marland Kitchen Physically Abused:   . Sexually Abused:    Family History  Problem Relation Age of Onset  . Hypertension Mother   . Diabetes Mother   . Arthritis Mother   . Obesity Mother   . Hypertension Maternal Grandmother   . Diabetes Maternal Grandmother   . Arthritis Maternal Grandmother   . COPD Maternal Uncle   . Miscarriages / Stillbirths Other    Past Surgical History:  Procedure Laterality Date  . CESAREAN SECTION    . CESAREAN SECTION N/A 04/12/2019   Procedure: CESAREAN SECTION;  Surgeon: Hermina Staggers, MD;  Location: [facility];  Service: Obstetrics;  Laterality: N/A;  Bilateral Tubal Ligation  . CESAREAN SECTION MULTI-GESTATIONAL N/A 03/07/2018   Procedure: CESAREAN SECTION MULTI-GESTATIONAL;  Surgeon: Conan Bowens, MD;  Location: [facility] SU[facility];  Service: Obstetrics;  Laterality: N/A;  . NO PAST SURGERIES       Mardella Layman, MD 09/01/19 1325

## 2019-09-01 NOTE — Discharge Instructions (Signed)
Prednisone will cause your blood sugar to increase. Please be aware.

## 2019-09-01 NOTE — ED Triage Notes (Signed)
Pt c/o lower back/hip pain on the right side with leg numbness x 1 week. Pt states it feels like pins and needles tingling. Pt states she used to get injections of cortisone in her knee.

## 2019-09-03 DIAGNOSIS — Z419 Encounter for procedure for purposes other than remedying health state, unspecified: Secondary | ICD-10-CM | POA: Diagnosis not present

## 2019-10-04 DIAGNOSIS — Z419 Encounter for procedure for purposes other than remedying health state, unspecified: Secondary | ICD-10-CM | POA: Diagnosis not present

## 2019-11-04 DIAGNOSIS — Z419 Encounter for procedure for purposes other than remedying health state, unspecified: Secondary | ICD-10-CM | POA: Diagnosis not present

## 2019-12-04 DIAGNOSIS — Z419 Encounter for procedure for purposes other than remedying health state, unspecified: Secondary | ICD-10-CM | POA: Diagnosis not present

## 2020-01-04 DIAGNOSIS — Z419 Encounter for procedure for purposes other than remedying health state, unspecified: Secondary | ICD-10-CM | POA: Diagnosis not present

## 2020-01-15 IMAGING — US US MFM OB FOLLOW-UP EACH ADDL GEST (MODIFY)
1 series · 15 of 28 positions shown · non-contrast
Comparison: none

[Series 1: us mfm ob follow-up each addl gest (modify) · 15 of 72 slices shown]
[im 1/72]
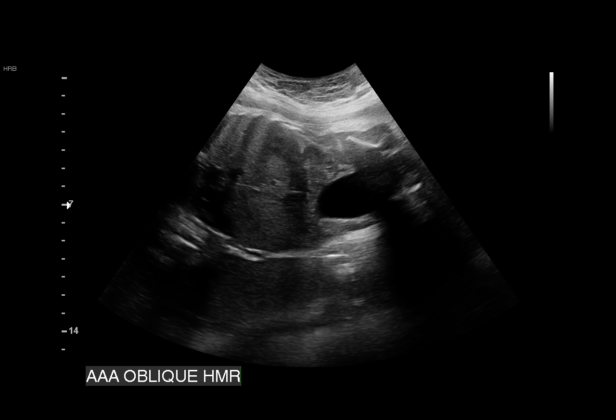
[im 6/72]
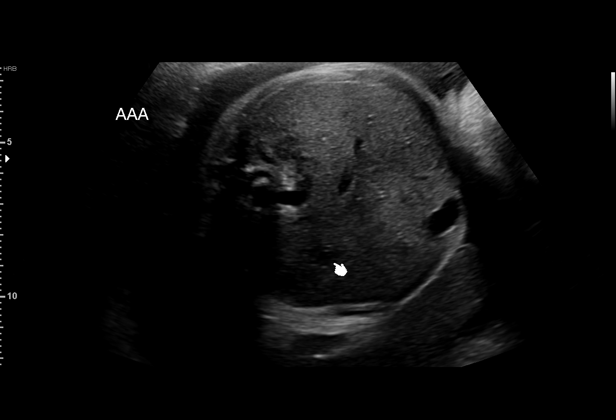
[im 11/72]
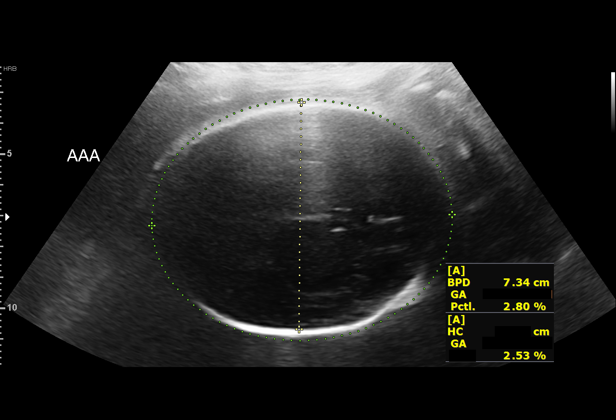
[im 16/72]
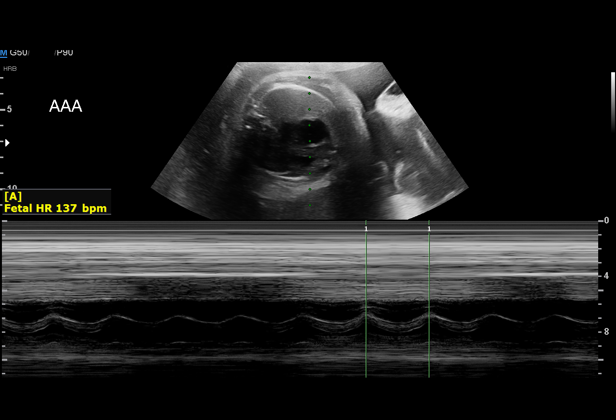
[im 22/72]
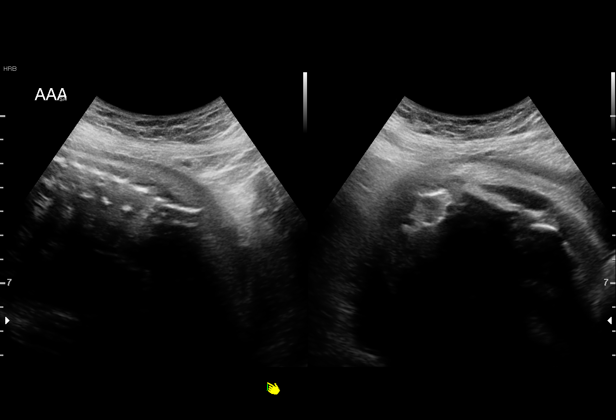
[im 27/72]
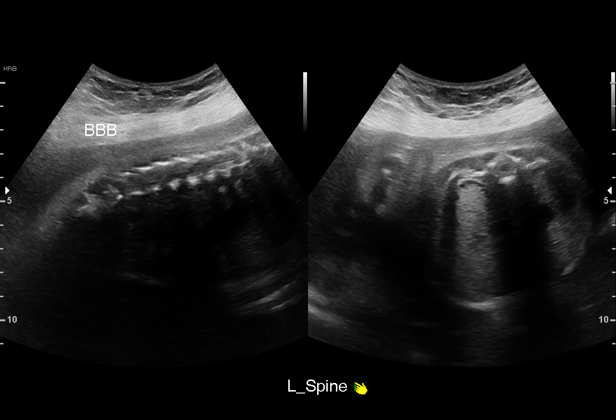
[im 32/72]
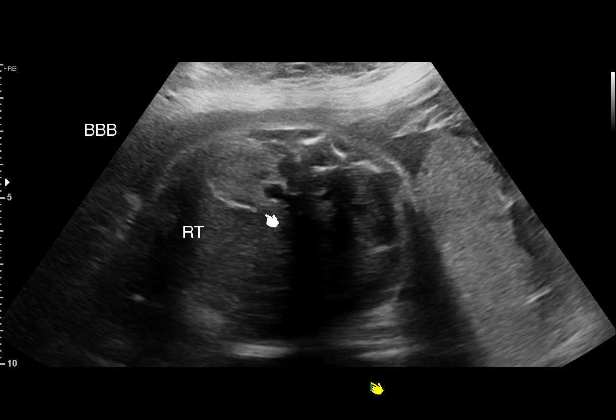
[im 37/72]
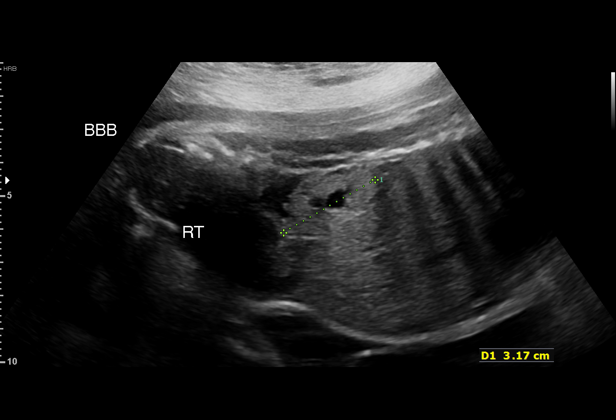
[im 40/72]
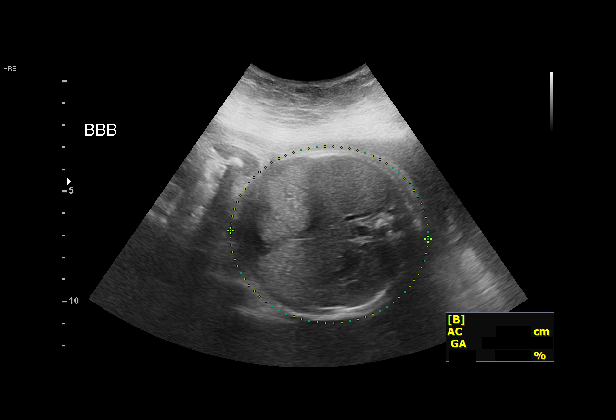
[im 45/72]
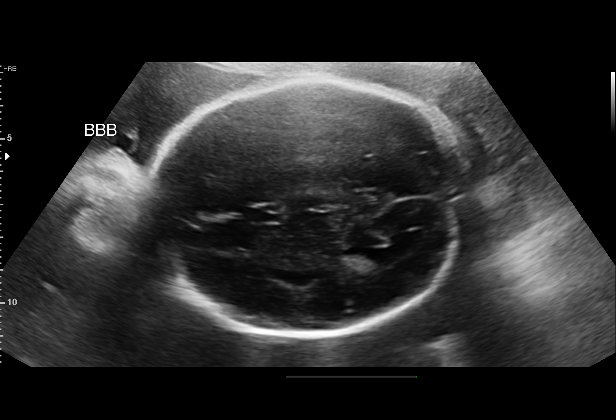
[im 50/72]
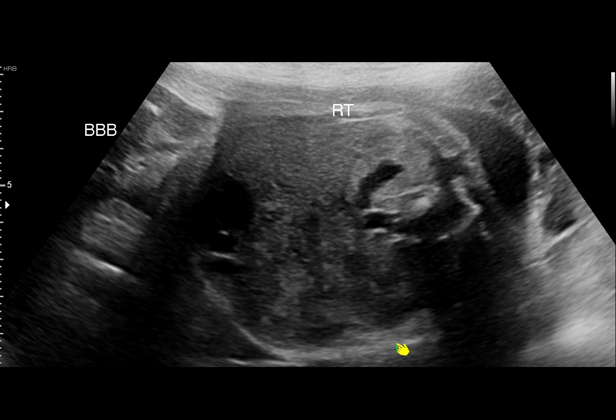
[im 56/72]
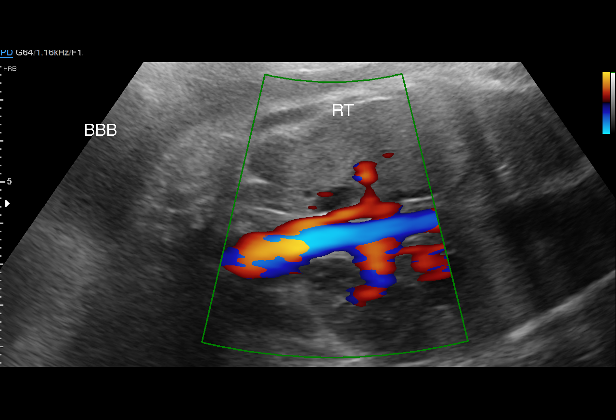
[im 61/72]
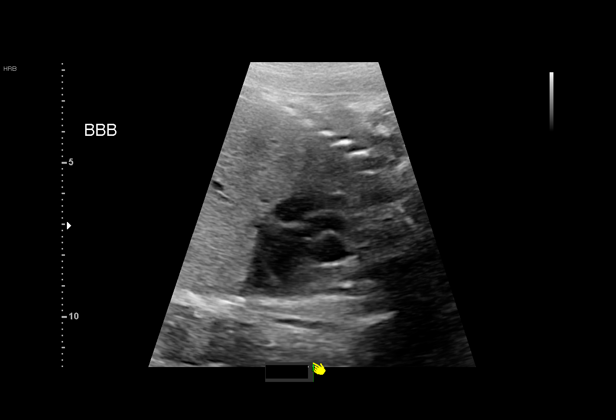
[im 66/72]
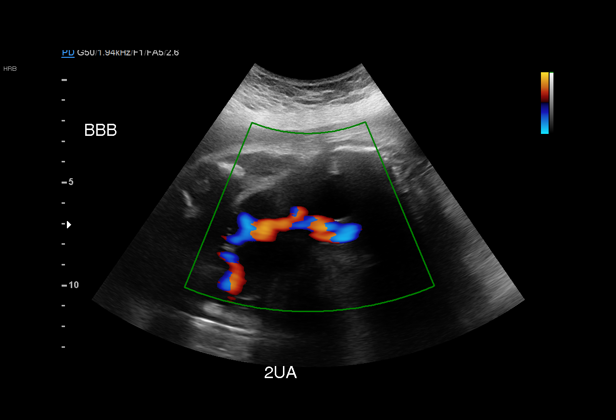
[im 72/72]
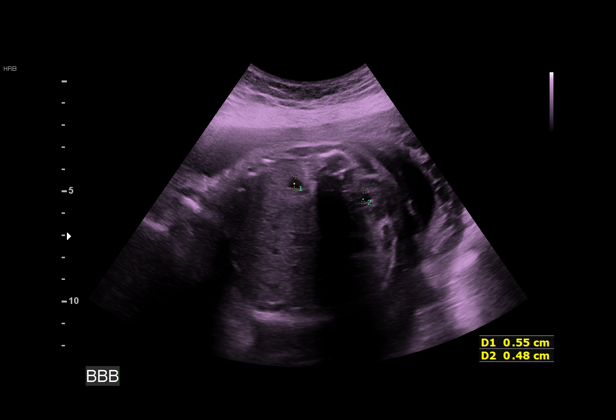

[15 of 28 positions shown; findings below may reference images not displayed]

Obstetrics &
                                                            Gynecology
                                                            [address]
                                                            [place].
 Attending:        Abhimanyu Wilson        Secondary Phy.:   L&D Nursing- L/D
                   CNM

     GEST
 ----------------------------------------------------------------------

 ----------------------------------------------------------------------
Indications

  31 weeks gestation of pregnancy
  Abdominal pain in pregnancy
  Twin pregnancy, di/di, second trimester
  Pre-existing diabetes, type 2, in pregnancy,
  second trimester (Insulin)
  Obesity complicating pregnancy, second
  trimester (BMI 43)
  Abnormal biochemical screen (Panorama-
  Insufficient fetal DNA FF 2.0%, 1.2%)
 ----------------------------------------------------------------------
Vital Signs

 BMI:
Fetal Evaluation (Fetus A)

 Num Of Fetuses:         2
 Fetal Heart Rate(bpm):  137
 Cardiac Activity:       Observed
 Fetal Lie:              Maternal right side
 Presentation:           Breech
 Placenta:               Posterior
 Amniotic Fluid
 AFI FV:      Within normal limits

                             Largest Pocket(cm)

Biometry (Fetus A)

 BPD:      74.3  mm     G. Age:  29w 6d          5  %    CI:        73.81   %    70 - 86
                                                         FL/HC:      21.3   %    19.3 -
 HC:      274.7  mm     G. Age:  30w 0d        < 3  %    HC/AC:      0.97        0.96 -
 AC:      283.3  mm     G. Age:  32w 2d         75  %    FL/BPD:     78.7   %    71 - 87
 FL:       58.5  mm     G. Age:  30w 4d         17  %    FL/AC:      20.6   %    20 - 24
 HUM:      49.4  mm     G. Age:  29w 0d        < 5  %

 LV:        3.7  mm

 Est. FW:    6446  gm    3 lb 14 oz      54  %     FW Discordancy     0 \ 16 %
OB History

 Gravidity:    7         Term:   3        Prem:   0        SAB:   3
 TOP:          0       Ectopic:  0        Living: 3
Gestational Age (Fetus A)

 U/S Today:     30w 5d                                        EDD:   04/02/18
 Best:          31w 3d     Det. By:  Early Ultrasound         EDD:   03/28/18
Anatomy (Fetus A)

 Cranium:               Appears normal         Aortic Arch:            Previously seen
 Cavum:                 Appears normal         Ductal Arch:            Not well visualized
 Ventricles:            Appears normal         Diaphragm:              Previously seen
 Choroid Plexus:        Previously seen        Stomach:                Appears normal, left
                                                                       sided
 Cerebellum:            Previously seen        Abdomen:                Appears normal
 Posterior Fossa:       Previously seen        Abdominal Wall:         Previously seen
 Nuchal Fold:           Not applicable (>20    Cord Vessels:           Previously seen
                        wks GA)
 Face:                  Orbits and profile     Kidneys:                Appear normal
                        previously seen
 Lips:                  Previously seen        Bladder:                Appears normal
 Thoracic:              Appears normal         Spine:                  Ltd views no
                                                                       intracranial signs of
                                                                       NT
 Heart:                 Previously seen        Upper Extremities:      Previously seen
 RVOT:                  Appears normal         Lower Extremities:      Previously seen
 LVOT:                  Not well visualized

 Other:  Fetus appears to be a male. Technically difficult due to maternal
         habitus and fetal position.

Fetal Evaluation (Fetus B)

 Num Of Fetuses:         2
 Fetal Heart Rate(bpm):  157
 Cardiac Activity:       Observed
 Fetal Lie:              Maternal left side
 Presentation:           Breech
 Placenta:               Posterior
 Membrane Desc:      Dividing Membrane seen

 Amniotic Fluid
 AFI FV:      Within normal limits

                             Largest Pocket(cm)

Biometry (Fetus B)

 BPD:      77.3  mm     G. Age:  31w 0d         27  %    CI:        74.67   %    70 - 86
                                                         FL/HC:      18.2   %    19.3 -
 HC:      283.9  mm     G. Age:  31w 1d         11  %    HC/AC:      1.06        0.96 -
 AC:      268.1  mm     G. Age:  30w 6d         33  %    FL/BPD:     66.8   %    71 - 87
 FL:       51.6  mm     G. Age:  27w 4d        < 3  %    FL/AC:      19.2   %    20 - 24
 HUM:      47.8  mm     G. Age:  28w 0d        < 5  %
 LV:        5.9  mm

 Est. FW:    3421  gm      3 lb 4 oz     25  %     FW Discordancy        16  %
Gestational Age (Fetus B)

 U/S Today:     30w 1d                                        EDD:   04/06/18
 Best:          31w 3d     Det. By:  Early Ultrasound         EDD:   03/28/18
Anatomy (Fetus B)

 Cranium:               Appears normal         Aortic Arch:            Not well visualized
 Cavum:                 Appears normal         Ductal Arch:            Not well visualized
 Ventricles:            Appears normal         Diaphragm:              Appears normal
 Choroid Plexus:        Not well visualized    Stomach:                Appears normal, left
                                                                       sided
 Cerebellum:            Previously seen        Abdomen:                Appears normal
 Posterior Fossa:       Previously seen        Abdominal Wall:         Previously seen
 Nuchal Fold:           Not applicable (>20    Cord Vessels:           2 vessel cord,
                        wks GA)
                                                                       absent right Juuh
                                                                       Bertrand
 Face:                  Profile previously     Kidneys:                See comments
                        seen
 Lips:                  Previously seen        Bladder:                Appears normal
 Thoracic:              Appears normal         Spine:                  Ltd views no
                                                                       intracranial signs of
                                                                       NT
 Heart:                 Not well visualized    Upper Extremities:      Previously seen
 RVOT:                  Appears normal         Lower Extremities:      Previously seen
 LVOT:                  Appears normal

 Other:  Fetus appears to be female. Nasal bone visualized. Open Rt hand
         seen. Technically difficult due to maternal habitus and fetal position.
Cervix Uterus Adnexa

 Cervix
 Not visualized (advanced GA >66wks)
Impression

 Dichorionic-diamniotic twin pregnancy.

 Twin A: Maternal right, breech, posterior placenta. Fetal
 growth is appropriate for gestational age. Amniotic fluid is
 normal and good fetal activity is seen.

 Twin B: Maternal left, breech (initially in the cephalic
 position), posterior placenta. Fetal growth is appropriate for
 gestational age. Amniotic fluid is normal and good fetal
 activity is seen. Signficant findings include a) single umbilical
 artery, b) slight echogenicity of right kidney. Right kidney,
 otherwise, looks normal with urinary tract dilation or cysts.
 Left kidney appears normal.

 Growth discordancy: 16% (normal).
 See consultation note in [REDACTED].
Recommendations

 -Consider increasing insulin NPH to 20 units in the morning
 and 20 units at night.
 -Add Humalog 4 units with each meal and increase as
 needed.
 -Check fasting and postprandial (1 or 2 hours) blood glucose.
 -Hb A1C with next blood draw.
 -BP monitoring as per protocol.
 -Weekly BPP and NST.
 --Antibiotics if UTI is not treated.

                 Nashed, Diondre

## 2020-02-03 DIAGNOSIS — Z419 Encounter for procedure for purposes other than remedying health state, unspecified: Secondary | ICD-10-CM | POA: Diagnosis not present

## 2020-02-08 ENCOUNTER — Encounter: Payer: Self-pay | Admitting: General Practice

## 2020-03-05 DIAGNOSIS — Z419 Encounter for procedure for purposes other than remedying health state, unspecified: Secondary | ICD-10-CM | POA: Diagnosis not present

## 2020-03-17 ENCOUNTER — Other Ambulatory Visit: Payer: Self-pay

## 2020-03-17 ENCOUNTER — Other Ambulatory Visit: Payer: Self-pay | Admitting: *Deleted

## 2020-03-17 DIAGNOSIS — Z0000 Encounter for general adult medical examination without abnormal findings: Secondary | ICD-10-CM

## 2020-03-17 NOTE — Patient Outreach (Signed)
Medicaid Managed Care   Nurse Care Manager Note  03/17/2020 Name:  Ellen Martin MRN:  [id number] DOB:  Sep 15, 1985  Ellen Martin is an 35 y.o. year old female who is a primary patient of Health, [facility] [employer].  The [facility] Managed Care Coordination team was consulted for assistance with:    DMII  Ellen Martin was given information about Medicaid Managed Care Coordination team services today. Ellen Martin agreed to services and verbal consent obtained.  Engaged with patient by telephone for initial visit in response to provider referral for case management and/or care coordination services.   Assessments/Interventions:  Review of past medical history, allergies, medications, health status, including review of consultants reports, laboratory and other test data, was performed as part of comprehensive evaluation and provision of chronic care management services.  During my phone conversation with Ellen Martin today, the call was disconnected. Attempt to reconnect with Ellen Martin was unsuccessful. RNCM left a detailed HIPAA compliant message including contact information. RNCM will reach out again in the next 7 days.   SDOH (Social Determinants of Health) assessments and interventions performed:   Care Plan  Allergies  Allergen Reactions  . Azithromycin Hives    Hives without SOB or wheezing after given Azithromycin for cesarean    Medications Reviewed Today    Reviewed by Heidi Dach, RN (Registered Nurse) on 03/17/20 at 1417  Med List Status: <None>  Medication Order Taking? Sig Documenting Provider Last Dose Status Informant  aspirin EC 325 MG tablet [id number] No Take 325 mg by mouth every 6 (six) hours as needed for fever.  Patient not taking: Reported on 03/17/2020   [provider] Not Taking Active Self  cephALEXin (KEFLEX) 500 MG capsule [id number] No Take 1 capsule (500 mg total) by mouth 4 (four) times daily.  Patient not taking: Reported on  03/17/2020   Terrilee Files, MD Not Taking Active   enalapril (VASOTEC) 5 MG tablet [id number] No Take 1 tablet (5 mg total) by mouth daily.  Patient not taking: No sig reported   Hermina Staggers, MD Not Taking Active Self  HYDROcodone-acetaminophen (NORCO/VICODIN) 5-325 MG tablet [id number] No Take 1-2 tablets by mouth every 4 (four) hours as needed for moderate pain.  Patient not taking: No sig reported   Fair, Hoyle Sauer, MD Not Taking Active Self  ibuprofen (ADVIL) 800 MG tablet [id number] No Take 1 tablet (800 mg total) by mouth every 8 (eight) hours.  Patient not taking: No sig reported   Hermina Staggers, MD Not Taking Active Self  insulin glargine (LANTUS) 100 UNIT/ML injection [id number] No Inject 0.1 mLs (10 Units total) into the skin daily.  Patient not taking: No sig reported   Hermina Staggers, MD Not Taking Active Self  LANTUS SOLOSTAR 100 UNIT/ML Solostar Pen [id number] No Inject 5 Units into the skin at bedtime.   Patient not taking: Reported on 03/17/2020   [provider] Not Taking Active Self  loratadine (CLARITIN) 10 MG tablet [id number] No Take 1 tablet (10 mg total) by mouth daily.  Patient not taking: No sig reported   Hermina Staggers, MD Not Taking Active Self  metFORMIN (GLUCOPHAGE) 500 MG tablet [id number] No Take 1 tablet (500 mg total) by mouth 2 (two) times daily with a meal. For diabetes  Patient not taking: Reported on 03/17/2020   Hermina Staggers, MD Not Taking Active Self  predniSONE (STERAPRED UNI-PAK 21 TAB) 10 MG (21) TBPK tablet [id number] No Take  by mouth daily. Take as directed.  Patient not taking: Reported on 03/17/2020   Mardella Layman, MD Not Taking Active   senna-docusate (SENOKOT-S) 8.6-50 MG tablet [id number] No Take 2 tablets by mouth daily.  Patient not taking: No sig reported   Hermina Staggers, MD Not Taking Active Self          Patient Active Problem List   Diagnosis Date Noted  . Normal labor 04/12/2019  . H/O tubal ligation  04/12/2019  . GBS (group B Streptococcus carrier), +RV culture, currently pregnant 03/30/2019  . Supervision of high risk pregnancy, antepartum 03/18/2019  . Nausea and vomiting of pregnancy, antepartum 01/29/2019  . Suicidal ideations   . Adjustment disorder with mixed disturbance of emotions and conduct 05/21/2018  . MDD (major depressive disorder), single episode, moderate (HCC) 05/21/2018  . Suicide attempt (HCC)   . History of 2 cesarean sections 03/07/2018  . Pre-existing type 2 diabetes affecting pregnancy, antepartum 01/30/2018  . Gallstone 01/30/2018  . Gestational hypertension 01/28/2018    Conditions to be addressed/monitored per PCP order:  DMII  A care plan was not completed today as our visit was cut short. Ellen. Martin stated that she needs to find a physician. A referral for Care Guide assistance will be made for procurement of PCP appointment. She is currently not managing her DM or HTN.   Plan: The Managed Medicaid care management team will reach out to the patient again over the next 7 days.   Estanislado Emms RN, BSN [place]  [employer]

## 2020-03-30 ENCOUNTER — Other Ambulatory Visit: Payer: Self-pay

## 2020-03-30 ENCOUNTER — Other Ambulatory Visit: Payer: Self-pay | Admitting: *Deleted

## 2020-03-30 NOTE — Patient Outreach (Signed)
Care Coordination  03/30/2020  Ellen Martin 1985/07/12 [id number]   Successful telephone outreach today with [name], however she has lost her voice and requested to reschedule this telephone visit. Patient is currently in [name] with her mother and asked to be called after 04/18/20. New appointment was scheduled for 04/20/20 @ 9am. [name] agrees with this date and time.   RNCM informed patient that someone would be calling to assist her with making a new patient visit appointment with a PCP. Emphasized the importance of her getting established with a PCP and managing her health. Patient voiced understanding.  Estanislado Emms RN, BSN [place]  [employer]

## 2020-03-31 ENCOUNTER — Telehealth: Payer: Self-pay

## 2020-03-31 NOTE — Telephone Encounter (Signed)
   Telephone encounter was:  Successful.  03/31/2020 Name: Atlanta Pelto MRN: [id number] DOB: [birth year]  Nisa Decaire is a 35 y.o. year old female who is a primary care patient of Health, [facility] [employer] . The community resource team was consulted for assistance with establising a primary care provider.  Care guide performed the following interventions: Discussed resources to assist with establishing a primary care provider. Patient stated that she would like to go to [facility]. Patient also informed Care Guide that she is in [name] until the end of February and will make an appointment when she returns to [facility]. Care Guide will give [facility] a call to see if they are accepting new patients and let the patient know.  Patient stated understanding. .  Follow Up Plan:  Care guide will follow up with patient by phone over the next week.   [facility] Care Guide, Embedded Care Coordination [facility], Care Management Phone: [phone] Email: [email or web address]

## 2020-04-05 DIAGNOSIS — Z419 Encounter for procedure for purposes other than remedying health state, unspecified: Secondary | ICD-10-CM | POA: Diagnosis not present

## 2020-04-08 NOTE — Telephone Encounter (Signed)
   Telephone encounter was:  Successful.  04/08/2020 Name: Ellen Martin MRN: [id number] DOB: 29-Oct-1985  Lawanda Holzheimer is a 35 y.o. year old female who is a primary care patient of Health, [facility] [employer] . The community resource team was consulted for assistance with Establishing a PCP.  Care guide performed the following interventions: Spoke with patient today regarding referral. Patient stated that she has called [facility] and scheduled an appointment for 04/21/19. No addtional needs at this time. .  Follow Up Plan:  No further follow up planned at this time. The patient has been provided with needed resources.  [facility] Care Guide, Embedded Care Coordination [facility], Care Management Phone: [phone] Email: [email or web address]

## 2020-04-20 ENCOUNTER — Other Ambulatory Visit: Payer: Self-pay | Admitting: *Deleted

## 2020-04-20 NOTE — Patient Instructions (Signed)
Visit Information  Ms. Analuisa Tudor  - as a part of your Medicaid benefit, you are eligible for care management and care coordination services at no cost or copay. I was unable to reach you by phone today but would be happy to help you with your health related needs. Please feel free to call me @ [phone].   A member of the Managed Medicaid care management team will reach out to you again over the next 7-14 days.   Estanislado Emms RN, BSN [place]  [employer]

## 2020-04-20 NOTE — Patient Outreach (Signed)
Care Coordination  04/20/2020  Ellen Martin [birth year] [id number]    Medicaid Managed Care   Unsuccessful Outreach Note  04/20/2020 Name: Ellen Martin MRN: [id number] DOB: 07-Oct-1985  Referred by: Health, [facility] Department Of Public Reason for referral : High Risk Managed Medicaid (Unsuccessful RNCM outreach)   An unsuccessful telephone outreach was attempted today. The patient was referred to the case management team for assistance with care management and care coordination.   Follow Up Plan: A HIPAA compliant phone message was left for the patient providing contact information and requesting a return call.  The care management team will reach out to the patient again over the next 7-14 days.   Estanislado Emms RN, BSN [place]  [employer]

## 2020-04-26 ENCOUNTER — Telehealth: Payer: Self-pay

## 2020-04-26 NOTE — Telephone Encounter (Signed)
I connected with Ellen Martin today to get her reschedule for a phone visit with the [facility] with the Managed Medicaid Team. Pt stated she was at the hospital with her son and could not talk right now. I will reach out again in the next 7-14 days.

## 2020-05-03 DIAGNOSIS — Z419 Encounter for procedure for purposes other than remedying health state, unspecified: Secondary | ICD-10-CM | POA: Diagnosis not present

## 2020-05-10 ENCOUNTER — Telehealth: Payer: Self-pay

## 2020-05-10 NOTE — Telephone Encounter (Signed)
I attempted to reach [name] today to get her rescheduled for her follow up phone visit with the [facility] on the Managed Medicaid Team. I left my contact info for her to return my call. I will reach out again in the next 7-14 days if I have not heard back from her.

## 2020-06-03 DIAGNOSIS — Z419 Encounter for procedure for purposes other than remedying health state, unspecified: Secondary | ICD-10-CM | POA: Diagnosis not present

## 2020-06-14 ENCOUNTER — Other Ambulatory Visit: Payer: Self-pay | Admitting: *Deleted

## 2020-06-14 NOTE — Patient Outreach (Signed)
Care Coordination  06/14/2020  Ellen Martin [birth year] [id number]   Medicaid Managed Care   Unsuccessful Outreach Note  06/14/2020 Name: Ellen Martin MRN: [id number] DOB: 1985/05/20  Referred by: Health, [facility] Department Of Public Reason for referral : High Risk Managed Medicaid (Unsuccessful follow up outreach)   An unsuccessful telephone outreach was attempted today. The patient was referred to the case management team for assistance with care management and care coordination.   Follow Up Plan: The patient has been provided with contact information for the care management team and has been advised to call with any health related questions or concerns.  A HIPAA compliant phone message was left for the patient providing contact information and requesting a return call.  The care management team will reach out to the patient again over the next 7-14 days.   Estanislado Emms RN, BSN [place]  [employer]

## 2020-06-14 NOTE — Patient Instructions (Signed)
Visit Information  Ms. Ellen Martin  - as a part of your Medicaid benefit, you are eligible for care management and care coordination services at no cost or copay. I was unable to reach you by phone today but would be happy to help you with your health related needs. Please feel free to call me @ [phone].   A member of the Managed Medicaid care management team will reach out to you again over the next 7-14 days.   Estanislado Emms RN, BSN [place]  [employer]

## 2020-06-26 ENCOUNTER — Emergency Department (HOSPITAL_COMMUNITY)
Admission: EM | Admit: 2020-06-26 | Discharge: 2020-06-27 | Disposition: A | Discharge status: Home / Self Care | Payer: Medicaid Other | Attending: Emergency Medicine | Admitting: Emergency Medicine

## 2020-06-26 ENCOUNTER — Encounter (HOSPITAL_COMMUNITY): Payer: Self-pay

## 2020-06-26 ENCOUNTER — Other Ambulatory Visit: Payer: Self-pay

## 2020-06-26 DIAGNOSIS — L02811 Cutaneous abscess of head [any part, except face]: Secondary | ICD-10-CM | POA: Diagnosis not present

## 2020-06-26 DIAGNOSIS — Z79899 Other long term (current) drug therapy: Secondary | ICD-10-CM | POA: Insufficient documentation

## 2020-06-26 DIAGNOSIS — F17210 Nicotine dependence, cigarettes, uncomplicated: Secondary | ICD-10-CM | POA: Diagnosis not present

## 2020-06-26 DIAGNOSIS — Z794 Long term (current) use of insulin: Secondary | ICD-10-CM | POA: Insufficient documentation

## 2020-06-26 DIAGNOSIS — Z7984 Long term (current) use of oral hypoglycemic drugs: Secondary | ICD-10-CM | POA: Insufficient documentation

## 2020-06-26 DIAGNOSIS — E119 Type 2 diabetes mellitus without complications: Secondary | ICD-10-CM | POA: Insufficient documentation

## 2020-06-26 DIAGNOSIS — Z7982 Long term (current) use of aspirin: Secondary | ICD-10-CM | POA: Insufficient documentation

## 2020-06-26 DIAGNOSIS — I10 Essential (primary) hypertension: Secondary | ICD-10-CM | POA: Diagnosis not present

## 2020-06-26 DIAGNOSIS — R220 Localized swelling, mass and lump, head: Secondary | ICD-10-CM | POA: Diagnosis present

## 2020-06-26 LAB — CBC WITH DIFFERENTIAL/PLATELET
Abs Immature Granulocytes: 0.07 10*3/uL (ref 0.00–0.07)
Basophils Absolute: 0.1 10*3/uL (ref 0.0–0.1)
Basophils Relative: 0 %
Eosinophils Absolute: 0.3 10*3/uL (ref 0.0–0.5)
Eosinophils Relative: 2 %
HCT: 41.1 % (ref 36.0–46.0)
Hemoglobin: 13.5 g/dL (ref 12.0–15.0)
Immature Granulocytes: 0 %
Lymphocytes Relative: 17 %
Lymphs Abs: 2.7 10*3/uL (ref 0.7–4.0)
MCH: 28.5 pg (ref 26.0–34.0)
MCHC: 32.8 g/dL (ref 30.0–36.0)
MCV: 86.7 fL (ref 80.0–100.0)
Monocytes Absolute: 1.1 10*3/uL — ABNORMAL HIGH (ref 0.1–1.0)
Monocytes Relative: 7 %
Neutro Abs: 11.7 10*3/uL — ABNORMAL HIGH (ref 1.7–7.7)
Neutrophils Relative %: 74 %
Platelets: 325 10*3/uL (ref 150–400)
RBC: 4.74 MIL/uL (ref 3.87–5.11)
RDW: 12.6 % (ref 11.5–15.5)
WBC: 15.9 10*3/uL — ABNORMAL HIGH (ref 4.0–10.5)
nRBC: 0 % (ref 0.0–0.2)

## 2020-06-26 LAB — BASIC METABOLIC PANEL
Anion gap: 13 (ref 5–15)
BUN: 8 mg/dL (ref 6–20)
CO2: 24 mmol/L (ref 22–32)
Calcium: 9.4 mg/dL (ref 8.9–10.3)
Chloride: 93 mmol/L — ABNORMAL LOW (ref 98–111)
Creatinine, Ser: 0.56 mg/dL (ref 0.44–1.00)
GFR, Estimated: >60 mL/min (ref >60)
Glucose, Bld: 558 mg/dL (ref 70–99)
Potassium: 4.5 mmol/L (ref 3.5–5.1)
Sodium: 130 mmol/L — ABNORMAL LOW (ref 135–145)

## 2020-06-26 MED ORDER — LIDOCAINE HCL (PF) 1 % IJ SOLN
5.0000 mL | Freq: Once | INTRAMUSCULAR | Status: AC
  Administered 2020-06-27: 5 mL via INTRADERMAL
  Filled 2020-06-26: qty 5

## 2020-06-26 MED ORDER — ACETAMINOPHEN 325 MG PO TABS
650.0000 mg | ORAL_TABLET | Freq: Once | ORAL | Status: AC
  Administered 2020-06-26: 650 mg via ORAL
  Filled 2020-06-26: qty 2

## 2020-06-26 NOTE — ED Triage Notes (Signed)
Possible abscess to back of head x 3 days. Feels like area and down to her neck is burning. Denies any fever and chills.

## 2020-06-26 NOTE — ED Notes (Signed)
Per pt she did not drive someone dropped her off and will come back and pick her up

## 2020-06-26 NOTE — ED Notes (Signed)
Provider at bedside at this time

## 2020-06-26 NOTE — ED Triage Notes (Signed)
Emergency Medicine Provider Triage Evaluation Note  Ellen Martin , a 35 y.o. female  was evaluated in triage.  Pt complains of abscess to back of head x3 days. History of same. No fever or chills. No IV drug use.  Review of Systems  Positive: Color change, wound Negative: Fever/chills  Physical Exam  BP (!) 143/101 (BP Location: Right Arm)   Pulse (!) 102   Temp 98.9 F (37.2 C) (Oral)   Resp (!) 22   Ht 5\' 9"  (1.753 m)   Wt 128.4 kg   SpO2 99%   BMI 41.80 kg/m  Gen:   Awake, no distress   HEENT:  Induration and erythema to posterior aspect of head Resp:  Normal effort  Cardiac:  Normal rate  Abd:   Nondistended, nontender  MSK:   Moves extremities without difficulty  Neuro:  Speech clear   Medical Decision Making  Medically screening exam initiated at 8:19 PM.  Appropriate orders placed.  Ellen Martin was informed that the remainder of the evaluation will be completed by another provider, this initial triage assessment does not replace that evaluation, and the importance of remaining in the ED until their evaluation is complete.  Clinical Impression  Abscess to back of head. Given severity, labs ordered. Tylenol in triage.    [name], Mannie Stabile 06/26/20 2020

## 2020-06-27 LAB — CBG MONITORING, ED: Glucose-Capillary: 465 mg/dL — ABNORMAL HIGH (ref 70–99)

## 2020-06-27 MED ORDER — OXYCODONE-ACETAMINOPHEN 5-325 MG PO TABS
2.0000 | ORAL_TABLET | Freq: Once | ORAL | Status: AC
  Administered 2020-06-27: 2 via ORAL
  Filled 2020-06-27: qty 2

## 2020-06-27 MED ORDER — INSULIN ASPART 100 UNIT/ML ~~LOC~~ SOLN
8.0000 [IU] | Freq: Once | SUBCUTANEOUS | Status: AC
  Administered 2020-06-27: 8 [IU] via SUBCUTANEOUS

## 2020-06-27 MED ORDER — CLINDAMYCIN HCL 150 MG PO CAPS
300.0000 mg | ORAL_CAPSULE | Freq: Once | ORAL | Status: AC
  Administered 2020-06-27: 300 mg via ORAL
  Filled 2020-06-27: qty 2

## 2020-06-27 MED ORDER — CLINDAMYCIN HCL 300 MG PO CAPS: 300.0000 mg | ORAL_CAPSULE | Freq: Four times a day (QID) | ORAL | 0 refills | Status: AC

## 2020-06-27 MED ORDER — OXYCODONE-ACETAMINOPHEN 5-325 MG PO TABS: 1.0000 | ORAL_TABLET | Freq: Four times a day (QID) | ORAL | 0 refills | Status: AC | PRN

## 2020-06-27 MED ORDER — METFORMIN HCL 500 MG PO TABS: 500.0000 mg | ORAL_TABLET | Freq: Two times a day (BID) | ORAL | 1 refills | Status: DC

## 2020-06-27 NOTE — ED Notes (Signed)
Pt ambulated to restroom at this time with steady gait.

## 2020-06-27 NOTE — ED Provider Notes (Signed)
[facility] EMERGENCY DEPARTMENT Provider Note   CSN: [id number] Arrival date & time: 06/26/20  1933     History Chief Complaint  Patient presents with  . Abscess    Ellen Martin is a 35 y.o. female.  Patient is a 35 year old female with past medical history of diabetes, depression, obesity, hypertension.  Patient presents with pain and swelling to the back of her head that has worsened over the past several days.  She tells me she normally gets small pustules, however they usually resolve spontaneously.  This time has continued to become larger and more painful.  She denies any fevers or chills.  The history is provided by the patient.       Past Medical History:  Diagnosis Date  . Anxiety   . Blood transfusion without reported diagnosis   . Depression   . Diabetes mellitus without complication (HCC)    Type 2  . Hypertension     Patient Active Problem List   Diagnosis Date Noted  . Normal labor 04/12/2019  . H/O tubal ligation 04/12/2019  . GBS (group B Streptococcus carrier), +RV culture, currently pregnant 03/30/2019  . Supervision of high risk pregnancy, antepartum 03/18/2019  . Nausea and vomiting of pregnancy, antepartum 01/29/2019  . Suicidal ideations   . Adjustment disorder with mixed disturbance of emotions and conduct 05/21/2018  . MDD (major depressive disorder), single episode, moderate (HCC) 05/21/2018  . Suicide attempt (HCC)   . History of 2 cesarean sections 03/07/2018  . Pre-existing type 2 diabetes affecting pregnancy, antepartum 01/30/2018  . Gallstone 01/30/2018  . Gestational hypertension 01/28/2018    Past Surgical History:  Procedure Laterality Date  . CESAREAN SECTION    . CESAREAN SECTION N/A 04/12/2019   Procedure: CESAREAN SECTION;  Surgeon: Hermina Staggers, MD;  Location: [facility] LD [facility];  Service: Obstetrics;  Laterality: N/A;  Bilateral Tubal Ligation  . CESAREAN SECTION MULTI-GESTATIONAL N/A 03/07/2018   Procedure:  CESAREAN SECTION MULTI-GESTATIONAL;  Surgeon: Conan Bowens, MD;  Location: [facility] BIRTHING SUITES;  Service: Obstetrics;  Laterality: N/A;  . NO PAST SURGERIES       OB History    Gravida  8   Para  5   Term  5   Preterm      AB  3   Living  6     SAB  3   IAB      Ectopic      Multiple  1   Live Births  6           Family History  Problem Relation Age of Onset  . Hypertension Mother   . Diabetes Mother   . Arthritis Mother   . Obesity Mother   . Hypertension Maternal Grandmother   . Diabetes Maternal Grandmother   . Arthritis Maternal Grandmother   . COPD Maternal Uncle   . Miscarriages / [place] Other     Social History   Tobacco Use  . Smoking status: Current Every Day Smoker    Packs/day: 0.25    Types: Cigarettes  . Smokeless tobacco: Never Used  . Tobacco comment: 1-2 per day  Vaping Use  . Vaping Use: Never used  Substance Use Topics  . Alcohol use: Not Currently  . Drug use: Not Currently    Types: Marijuana    Comment: "a while ago"    Home Medications Prior to Admission medications   Medication Sig Start Date End Date Taking? Authorizing Provider  aspirin EC  325 MG tablet Take 325 mg by mouth every 6 (six) hours as needed for fever. Patient not taking: Reported on 03/17/2020    [provider]  cephALEXin (KEFLEX) 500 MG capsule Take 1 capsule (500 mg total) by mouth 4 (four) times daily. Patient not taking: Reported on 03/17/2020 05/25/19   Terrilee Files, MD  enalapril (VASOTEC) 5 MG tablet Take 1 tablet (5 mg total) by mouth daily. Patient not taking: No sig reported 04/15/19   Hermina Staggers, MD  HYDROcodone-acetaminophen (NORCO/VICODIN) 5-325 MG tablet Take 1-2 tablets by mouth every 4 (four) hours as needed for moderate pain. Patient not taking: No sig reported 04/15/19   Joselyn Arrow, MD  ibuprofen (ADVIL) 800 MG tablet Take 1 tablet (800 mg total) by mouth every 8 (eight) hours. Patient not taking: No sig  reported 04/15/19   Hermina Staggers, MD  insulin glargine (LANTUS) 100 UNIT/ML injection Inject 0.1 mLs (10 Units total) into the skin daily. Patient not taking: No sig reported 04/15/19   Hermina Staggers, MD  LANTUS SOLOSTAR 100 UNIT/ML Solostar Pen Inject 5 Units into the skin at bedtime.  Patient not taking: Reported on 03/17/2020 04/15/19   [provider]  loratadine (CLARITIN) 10 MG tablet Take 1 tablet (10 mg total) by mouth daily. Patient not taking: No sig reported 04/15/19   Hermina Staggers, MD  metFORMIN (GLUCOPHAGE) 500 MG tablet Take 1 tablet (500 mg total) by mouth 2 (two) times daily with a meal. For diabetes Patient not taking: Reported on 03/17/2020 04/15/19   Hermina Staggers, MD  predniSONE (STERAPRED UNI-PAK 21 TAB) 10 MG (21) TBPK tablet Take by mouth daily. Take as directed. Patient not taking: Reported on 03/17/2020 09/01/19   Mardella Layman, MD  senna-docusate (SENOKOT-S) 8.6-50 MG tablet Take 2 tablets by mouth daily. Patient not taking: No sig reported 04/16/19   Hermina Staggers, MD    Allergies    Azithromycin  Review of Systems   Review of Systems  All other systems reviewed and are negative.   Physical Exam Updated Vital Signs BP (!) 119/92 (BP Location: Left Arm)   Pulse 92   Temp 98 F (36.7 C) (Oral)   Resp 20   Ht 5\' 9"  (1.753 m)   Wt 128.4 kg   SpO2 100%   BMI 41.80 kg/m   Physical Exam Vitals and nursing note reviewed.  Constitutional:      General: She is not in acute distress.    Appearance: Normal appearance. She is not ill-appearing.  HENT:     Head: Normocephalic and atraumatic.  Pulmonary:     Effort: Pulmonary effort is normal.  Skin:    General: Skin is warm.     Comments: There is a 4 cm, round, fluctuant area just below and left of the occiput.  It is erythematous, indurated and tender to the touch.  Neurological:     Mental Status: She is alert.     ED Results / Procedures / Treatments   Labs (all labs ordered are  listed, but only abnormal results are displayed) Labs Reviewed  CBC WITH DIFFERENTIAL/PLATELET - Abnormal; Notable for the following components:      Result Value   WBC 15.9 (*)    Neutro Abs 11.7 (*)    Monocytes Absolute 1.1 (*)    All other components within normal limits  BASIC METABOLIC PANEL - Abnormal; Notable for the following components:   Sodium 130 (*)  Chloride 93 (*)    Glucose, Bld 558 (*)    All other components within normal limits    EKG None  Radiology No results found.  Procedures Procedures   Medications Ordered in ED Medications  clindamycin (CLEOCIN) capsule 300 mg (has no administration in time range)  oxyCODONE-acetaminophen (PERCOCET/ROXICET) 5-325 MG per tablet 2 tablet (has no administration in time range)  acetaminophen (TYLENOL) tablet 650 mg (650 mg Oral Given 06/26/20 2020)  lidocaine (PF) (XYLOCAINE) 1 % injection 5 mL (5 mLs Intradermal Given 06/27/20 0002)    ED Course  I have reviewed the triage vital signs and the nursing notes.  Pertinent labs & imaging results that were available during my care of the patient were reviewed by me and considered in my medical decision making (see chart for details).    MDM Rules/Calculators/A&P  Abscess incised and drained as below.  Patient will be given clindamycin and pain medication.  She is to apply warm compresses at home as frequently as possible.  INCISION AND DRAINAGE Performed by: Geoffery Lyons Consent: Verbal consent obtained. Risks and benefits: risks, benefits and alternatives were discussed Type: abscess  Body area: posterior scalp  Anesthesia: local infiltration  Incision was made with a scalpel.  Local anesthetic: lidocaine 1% without epinephrine  Anesthetic total: 3 ml  Complexity: complex Blunt dissection to break up loculations  Drainage: purulent  Drainage amount: moderate  Packing material: no packing placed  Patient tolerance: Patient tolerated the procedure  well with no immediate complications.     Final Clinical Impression(s) / ED Diagnoses Final diagnoses:  None    Rx / DC Orders ED Discharge Orders    None       Geoffery Lyons, MD 06/27/20 [phone]

## 2020-06-27 NOTE — Discharge Instructions (Addendum)
Begin taking clindamycin as prescribed.  Take Percocet as prescribed as needed for pain.  Apply warm compresses as frequently as possible for the next several days.  Follow-up with primary doctor if symptoms are not improving in the next few days.

## 2020-06-27 NOTE — ED Notes (Signed)
Room setup for provider at this time

## 2020-06-27 NOTE — ED Notes (Signed)
Provider at bedside

## 2020-06-28 ENCOUNTER — Telehealth: Payer: Self-pay

## 2020-06-28 NOTE — Telephone Encounter (Signed)
Transition Care Management Follow-up Telephone Call  Date of discharge and from where: 06/27/2020 from [facility]  How have you been since you were released from the hospital? Pt stated that she is feeling   Any questions or concerns? No  Items Reviewed:  Did the pt receive and understand the discharge instructions provided? Yes   Medications obtained and verified? Yes   Other? No   Any new allergies since your discharge? No   Dietary orders reviewed? DM  Do you have support at home? Yes   Functional Questionnaire: (I = Independent and D = Dependent) ADLs: I  Bathing/Dressing- I  Meal Prep- I  Eating- I  Maintaining continence- I  Transferring/Ambulation- I  Managing Meds- I   Follow up appointments reviewed:   PCP Hospital f/u appt confirmed? No  Pt is calling [facility] to establish care.   Specialist Hospital f/u appt confirmed? No    Are transportation arrangements needed? No   If their condition worsens, is the pt aware to call PCP or go to the Emergency Dept.? Yes  Was the patient provided with contact information for the PCP's office or ED? Yes  Was to pt encouraged to call back with questions or concerns? Yes

## 2020-07-03 DIAGNOSIS — Z419 Encounter for procedure for purposes other than remedying health state, unspecified: Secondary | ICD-10-CM | POA: Diagnosis not present

## 2020-08-03 DIAGNOSIS — Z419 Encounter for procedure for purposes other than remedying health state, unspecified: Secondary | ICD-10-CM | POA: Diagnosis not present

## 2020-09-02 DIAGNOSIS — Z419 Encounter for procedure for purposes other than remedying health state, unspecified: Secondary | ICD-10-CM | POA: Diagnosis not present

## 2020-10-03 DIAGNOSIS — Z419 Encounter for procedure for purposes other than remedying health state, unspecified: Secondary | ICD-10-CM | POA: Diagnosis not present

## 2020-10-31 ENCOUNTER — Other Ambulatory Visit: Payer: Self-pay

## 2020-10-31 ENCOUNTER — Telehealth: Payer: Self-pay

## 2020-10-31 ENCOUNTER — Other Ambulatory Visit: Payer: Self-pay | Admitting: *Deleted

## 2020-10-31 NOTE — Patient Outreach (Signed)
Care Coordination  10/31/2020  Ellen Martin 1985-09-26 [id number]   Medicaid Managed Care   Unsuccessful Outreach Note  10/31/2020 Name: Ellen Martin MRN: [id number] DOB: 05/01/1985  Referred by: Health, [facility] Department Of Public Reason for referral : Case Closure (RNCM Case Closure-unable to maintain contact)   Third unsuccessful telephone outreach was attempted today. The patient was referred to the case management team for assistance with care management and care coordination. The patient's primary care provider has been notified of our unsuccessful attempts to make or maintain contact with the patient. The care management team is pleased to engage with this patient at any time in the future should he/she be interested in assistance from the care management team.   Follow Up Plan: We have been unable to make contact with the patient for follow up. The care management team is available to follow up with the patient after provider conversation with the patient regarding recommendation for care management engagement and subsequent re-referral to the care management team.   Estanislado Emms RN, BSN [place]  [facility] RN Care Coordinator

## 2020-10-31 NOTE — Telephone Encounter (Signed)
..   Medicaid Managed Care   Unsuccessful Outreach Note  10/31/2020 Name: Ellen Martin MRN: [id number] DOB: 12/10/1985  Referred by: Health, [facility] Department Of Public Reason for referral : High Risk Managed Medicaid (I called the patient today to reschedule her phone appt with the [facility]. Person that answered said I had the wrong number.)   A second unsuccessful telephone outreach was attempted today. The patient was referred to the case management team for assistance with care management and care coordination.   Follow Up Plan: We have been unable to make contact with the patient for follow up. The care management team is available to follow up with the patient after provider conversation with the patient regarding recommendation for care management engagement and subsequent re-referral to the care management team.   Weston Settle Care Guide, High Risk Medicaid Managed Care Embedded Care Coordination [facility]  [facility]

## 2020-11-03 DIAGNOSIS — Z419 Encounter for procedure for purposes other than remedying health state, unspecified: Secondary | ICD-10-CM | POA: Diagnosis not present

## 2020-12-03 DIAGNOSIS — Z419 Encounter for procedure for purposes other than remedying health state, unspecified: Secondary | ICD-10-CM | POA: Diagnosis not present

## 2021-01-03 DIAGNOSIS — Z419 Encounter for procedure for purposes other than remedying health state, unspecified: Secondary | ICD-10-CM | POA: Diagnosis not present

## 2021-01-29 ENCOUNTER — Emergency Department (HOSPITAL_COMMUNITY)
Admission: EM | Admit: 2021-01-29 | Discharge: 2021-01-30 | Disposition: A | Discharge status: Home / Self Care | Payer: Medicaid Other | Attending: Emergency Medicine | Admitting: Emergency Medicine

## 2021-01-29 ENCOUNTER — Encounter (HOSPITAL_COMMUNITY): Payer: Self-pay | Admitting: Pharmacy Technician

## 2021-01-29 ENCOUNTER — Emergency Department (HOSPITAL_COMMUNITY): Payer: Medicaid Other

## 2021-01-29 DIAGNOSIS — Z79899 Other long term (current) drug therapy: Secondary | ICD-10-CM | POA: Diagnosis not present

## 2021-01-29 DIAGNOSIS — Z7982 Long term (current) use of aspirin: Secondary | ICD-10-CM | POA: Diagnosis not present

## 2021-01-29 DIAGNOSIS — Z7984 Long term (current) use of oral hypoglycemic drugs: Secondary | ICD-10-CM | POA: Diagnosis not present

## 2021-01-29 DIAGNOSIS — N9489 Other specified conditions associated with female genital organs and menstrual cycle: Secondary | ICD-10-CM | POA: Insufficient documentation

## 2021-01-29 DIAGNOSIS — I499 Cardiac arrhythmia, unspecified: Secondary | ICD-10-CM | POA: Diagnosis not present

## 2021-01-29 DIAGNOSIS — Z794 Long term (current) use of insulin: Secondary | ICD-10-CM | POA: Insufficient documentation

## 2021-01-29 DIAGNOSIS — R6883 Chills (without fever): Secondary | ICD-10-CM | POA: Diagnosis not present

## 2021-01-29 DIAGNOSIS — Z743 Need for continuous supervision: Secondary | ICD-10-CM | POA: Diagnosis not present

## 2021-01-29 DIAGNOSIS — E278 Other specified disorders of adrenal gland: Secondary | ICD-10-CM

## 2021-01-29 DIAGNOSIS — D3501 Benign neoplasm of right adrenal gland: Secondary | ICD-10-CM | POA: Diagnosis not present

## 2021-01-29 DIAGNOSIS — K760 Fatty (change of) liver, not elsewhere classified: Secondary | ICD-10-CM | POA: Diagnosis not present

## 2021-01-29 DIAGNOSIS — R1084 Generalized abdominal pain: Secondary | ICD-10-CM | POA: Diagnosis not present

## 2021-01-29 DIAGNOSIS — Z20822 Contact with and (suspected) exposure to covid-19: Secondary | ICD-10-CM | POA: Diagnosis not present

## 2021-01-29 DIAGNOSIS — R059 Cough, unspecified: Secondary | ICD-10-CM | POA: Diagnosis not present

## 2021-01-29 DIAGNOSIS — R109 Unspecified abdominal pain: Secondary | ICD-10-CM | POA: Diagnosis not present

## 2021-01-29 DIAGNOSIS — R112 Nausea with vomiting, unspecified: Secondary | ICD-10-CM | POA: Diagnosis present

## 2021-01-29 DIAGNOSIS — N83202 Unspecified ovarian cyst, left side: Secondary | ICD-10-CM | POA: Insufficient documentation

## 2021-01-29 DIAGNOSIS — E860 Dehydration: Secondary | ICD-10-CM | POA: Insufficient documentation

## 2021-01-29 DIAGNOSIS — E119 Type 2 diabetes mellitus without complications: Secondary | ICD-10-CM | POA: Diagnosis not present

## 2021-01-29 DIAGNOSIS — E279 Disorder of adrenal gland, unspecified: Secondary | ICD-10-CM | POA: Diagnosis not present

## 2021-01-29 DIAGNOSIS — F17210 Nicotine dependence, cigarettes, uncomplicated: Secondary | ICD-10-CM | POA: Diagnosis not present

## 2021-01-29 LAB — I-STAT VENOUS BLOOD GAS, ED
Acid-Base Excess: 2 mmol/L (ref 0.0–2.0)
Bicarbonate: 26.1 mmol/L (ref 20.0–28.0)
Calcium, Ion: 1.09 mmol/L — ABNORMAL LOW (ref 1.15–1.40)
HCT: 52 % — ABNORMAL HIGH (ref 36.0–46.0)
Hemoglobin: 17.7 g/dL — ABNORMAL HIGH (ref 12.0–15.0)
O2 Saturation: 98 %
Potassium: 4.4 mmol/L (ref 3.5–5.1)
Sodium: 129 mmol/L — ABNORMAL LOW (ref 135–145)
TCO2: 27 mmol/L (ref 22–32)
pCO2, Ven: 40.2 mmHg — ABNORMAL LOW (ref 44.0–60.0)
pH, Ven: 7.421 (ref 7.250–7.430)
pO2, Ven: 97 mmHg — ABNORMAL HIGH (ref 32.0–45.0)

## 2021-01-29 LAB — COMPREHENSIVE METABOLIC PANEL
ALT: 25 U/L (ref 0–44)
AST: 19 U/L (ref 15–41)
Albumin: 3.6 g/dL (ref 3.5–5.0)
Alkaline Phosphatase: 115 U/L (ref 38–126)
Anion gap: 13 (ref 5–15)
BUN: 14 mg/dL (ref 6–20)
CO2: 22 mmol/L (ref 22–32)
Calcium: 9.1 mg/dL (ref 8.9–10.3)
Chloride: 93 mmol/L — ABNORMAL LOW (ref 98–111)
Creatinine, Ser: 0.67 mg/dL (ref 0.44–1.00)
GFR, Estimated: >60 mL/min (ref >60)
Glucose, Bld: 317 mg/dL — ABNORMAL HIGH (ref 70–99)
Potassium: 4.2 mmol/L (ref 3.5–5.1)
Sodium: 128 mmol/L — ABNORMAL LOW (ref 135–145)
Total Bilirubin: 1.2 mg/dL (ref 0.3–1.2)
Total Protein: 7.7 g/dL (ref 6.5–8.1)

## 2021-01-29 LAB — CBC WITH DIFFERENTIAL/PLATELET
Abs Immature Granulocytes: 0.09 10*3/uL — ABNORMAL HIGH (ref 0.00–0.07)
Basophils Absolute: 0 10*3/uL (ref 0.0–0.1)
Basophils Relative: 0 %
Eosinophils Absolute: 0.1 10*3/uL (ref 0.0–0.5)
Eosinophils Relative: 1 %
HCT: 49.2 % — ABNORMAL HIGH (ref 36.0–46.0)
Hemoglobin: 17 g/dL — ABNORMAL HIGH (ref 12.0–15.0)
Immature Granulocytes: 1 %
Lymphocytes Relative: 22 %
Lymphs Abs: 3 10*3/uL (ref 0.7–4.0)
MCH: 29.2 pg (ref 26.0–34.0)
MCHC: 34.6 g/dL (ref 30.0–36.0)
MCV: 84.5 fL (ref 80.0–100.0)
Monocytes Absolute: 1 10*3/uL (ref 0.1–1.0)
Monocytes Relative: 7 %
Neutro Abs: 9.2 10*3/uL — ABNORMAL HIGH (ref 1.7–7.7)
Neutrophils Relative %: 69 %
Platelets: 358 10*3/uL (ref 150–400)
RBC: 5.82 MIL/uL — ABNORMAL HIGH (ref 3.87–5.11)
RDW: 12.1 % (ref 11.5–15.5)
WBC: 13.4 10*3/uL — ABNORMAL HIGH (ref 4.0–10.5)
nRBC: 0 % (ref 0.0–0.2)

## 2021-01-29 LAB — I-STAT BETA HCG BLOOD, ED (MC, WL, AP ONLY): I-stat hCG, quantitative: <5 m[IU]/mL (ref <5)

## 2021-01-29 LAB — RESP PANEL BY RT-PCR (FLU A&B, COVID) ARPGX2
Influenza A by PCR: NEGATIVE
Influenza B by PCR: NEGATIVE
SARS Coronavirus 2 by RT PCR: NEGATIVE

## 2021-01-29 LAB — URINALYSIS, ROUTINE W REFLEX MICROSCOPIC
Bilirubin Urine: NEGATIVE
Glucose, UA: >=500 mg/dL — AB
Hgb urine dipstick: NEGATIVE
Ketones, ur: 80 mg/dL — AB
Nitrite: NEGATIVE
Protein, ur: 100 mg/dL — AB
Specific Gravity, Urine: 1.025 (ref 1.005–1.030)
WBC, UA: >50 WBC/hpf — ABNORMAL HIGH (ref 0–5)
pH: 5 (ref 5.0–8.0)

## 2021-01-29 LAB — LIPASE, BLOOD: Lipase: 31 U/L (ref 11–51)

## 2021-01-29 MED ORDER — LACTATED RINGERS IV BOLUS
1000.0000 mL | Freq: Once | INTRAVENOUS | Status: AC
  Administered 2021-01-30: 1000 mL via INTRAVENOUS

## 2021-01-29 MED ORDER — ONDANSETRON HCL 4 MG/2ML IJ SOLN
4.0000 mg | Freq: Once | INTRAMUSCULAR | Status: AC
  Administered 2021-01-30: 4 mg via INTRAVENOUS
  Filled 2021-01-29: qty 2

## 2021-01-29 NOTE — ED Triage Notes (Addendum)
Pt bib PTAR with cough, dry heaving and chills onset approx 1 week. LLQ abdominal pain. Has not been taking metformin as prescribed. Pt with decreased PO intake X 2 days.  160/100 HR 130 RR 20 99% RA 96.57F CBG 321

## 2021-01-29 NOTE — ED Provider Notes (Signed)
[facility] EMERGENCY DEPARTMENT Provider Note   CSN: [id number] Arrival date & time: 01/29/21  1731     History Chief Complaint  Patient presents with   Abdominal Pain    Ellen Martin is a 35 y.o. female.  The history is provided by the patient and medical records.  Abdominal Pain Ellen Martin is a 35 y.o. female who presents to the Emergency Department complaining of abdomina pain and vomiting.  She presents to the ED for evaluation of two days of nausea/vomiting, dry heaves with LLQ abdominal pain that started yesterday.  She has experienced cough for one week.  Fever to 100 today.   No dysuria, diarrhea, vaginal discharge.  Has a hx/o DM and has been off of her metformin for the last year.      Past Medical History:  Diagnosis Date   Anxiety    Blood transfusion without reported diagnosis    Depression    Diabetes mellitus without complication (HCC)    Type 2   Hypertension     Patient Active Problem List   Diagnosis Date Noted   Normal labor 04/12/2019   H/O tubal ligation 04/12/2019   GBS (group B Streptococcus carrier), +RV culture, currently pregnant 03/30/2019   Supervision of high risk pregnancy, antepartum 03/18/2019   Nausea and vomiting of pregnancy, antepartum 01/29/2019   Suicidal ideations    Adjustment disorder with mixed disturbance of emotions and conduct 05/21/2018   MDD (major depressive disorder), single episode, moderate (HCC) 05/21/2018   Suicide attempt (HCC)    History of 2 cesarean sections 03/07/2018   Pre-existing type 2 diabetes affecting pregnancy, antepartum 01/30/2018   Gallstone 01/30/2018   Gestational hypertension 01/28/2018    Past Surgical History:  Procedure Laterality Date   CESAREAN SECTION     CESAREAN SECTION N/A 04/12/2019   Procedure: CESAREAN SECTION;  Surgeon: Hermina Staggers, MD;  Location: [facility] LD ORS;  Service: Obstetrics;  Laterality: N/A;  Bilateral Tubal Ligation   CESAREAN SECTION  MULTI-GESTATIONAL N/A 03/07/2018   Procedure: CESAREAN SECTION MULTI-GESTATIONAL;  Surgeon: Conan Bowens, MD;  Location: [facility] BIRTHING SUITES;  Service: Obstetrics;  Laterality: N/A;   NO PAST SURGERIES       OB History     Gravida  8   Para  5   Term  5   Preterm      AB  3   Living  6      SAB  3   IAB      Ectopic      Multiple  1   Live Births  6           Family History  Problem Relation Age of Onset   Hypertension Mother    Diabetes Mother    Arthritis Mother    Obesity Mother    Hypertension Maternal Grandmother    Diabetes Maternal Grandmother    Arthritis Maternal Grandmother    COPD Maternal Uncle    Miscarriages / [place] Other     Social History   Tobacco Use   Smoking status: Every Day    Packs/day: 0.25    Types: Cigarettes   Smokeless tobacco: Never   Tobacco comments:    1-2 per day  Vaping Use   Vaping Use: Never used  Substance Use Topics   Alcohol use: Not Currently   Drug use: Not Currently    Types: Marijuana    Comment: "a while ago"    Home Medications Prior  to Admission medications   Medication Sig Start Date End Date Taking? Authorizing Provider  metFORMIN (GLUCOPHAGE) 500 MG tablet Take 1 tablet (500 mg total) by mouth 2 (two) times daily with a meal. 01/30/21  Yes Tilden Fossa, MD  ondansetron (ZOFRAN-ODT) 4 MG disintegrating tablet Take 1 tablet (4 mg total) by mouth every 8 (eight) hours as needed for nausea or vomiting. 01/30/21  Yes Tilden Fossa, MD  aspirin EC 325 MG tablet Take 325 mg by mouth every 6 (six) hours as needed for fever. Patient not taking: Reported on 03/17/2020    [provider]  clindamycin (CLEOCIN) 300 MG capsule Take 1 capsule (300 mg total) by mouth 4 (four) times daily. X 7 days 06/27/20   Geoffery Lyons, MD  enalapril (VASOTEC) 5 MG tablet Take 1 tablet (5 mg total) by mouth daily. Patient not taking: No sig reported 04/15/19   Hermina Staggers, MD   HYDROcodone-acetaminophen (NORCO/VICODIN) 5-325 MG tablet Take 1-2 tablets by mouth every 4 (four) hours as needed for moderate pain. Patient not taking: No sig reported 04/15/19   Joselyn Arrow, MD  ibuprofen (ADVIL) 800 MG tablet Take 1 tablet (800 mg total) by mouth every 8 (eight) hours. Patient not taking: No sig reported 04/15/19   Hermina Staggers, MD  insulin glargine (LANTUS) 100 UNIT/ML injection Inject 0.1 mLs (10 Units total) into the skin daily. Patient not taking: No sig reported 04/15/19   Hermina Staggers, MD  LANTUS SOLOSTAR 100 UNIT/ML Solostar Pen Inject 5 Units into the skin at bedtime.  Patient not taking: Reported on 03/17/2020 04/15/19   [provider]  loratadine (CLARITIN) 10 MG tablet Take 1 tablet (10 mg total) by mouth daily. Patient not taking: No sig reported 04/15/19   Hermina Staggers, MD  oxyCODONE-acetaminophen (PERCOCET) 5-325 MG tablet Take 1-2 tablets by mouth every 6 (six) hours as needed. 06/27/20   Geoffery Lyons, MD  predniSONE (STERAPRED UNI-PAK 21 TAB) 10 MG (21) TBPK tablet Take by mouth daily. Take as directed. Patient not taking: Reported on 03/17/2020 09/01/19   Mardella Layman, MD  senna-docusate (SENOKOT-S) 8.6-50 MG tablet Take 2 tablets by mouth daily. Patient not taking: No sig reported 04/16/19   Hermina Staggers, MD    Allergies    Azithromycin  Review of Systems   Review of Systems  Gastrointestinal:  Positive for abdominal pain.  All other systems reviewed and are negative.  Physical Exam Updated Vital Signs BP (!) 146/82   Pulse 79   Temp 98.1 F (36.7 C) (Temporal)   Resp 16   SpO2 96%   Physical Exam Vitals and nursing note reviewed.  Constitutional:      Appearance: She is well-developed.  HENT:     Head: Normocephalic and atraumatic.  Cardiovascular:     Rate and Rhythm: Regular rhythm. Tachycardia present.     Heart sounds: No murmur heard. Pulmonary:     Effort: Pulmonary effort is normal. No respiratory  distress.     Breath sounds: Normal breath sounds.  Abdominal:     Palpations: Abdomen is soft.     Tenderness: There is no guarding or rebound.     Comments: Mild generalized abdominal tenderness  Musculoskeletal:        General: No tenderness.  Skin:    General: Skin is warm and dry.  Neurological:     Mental Status: She is alert and oriented to person, place, and time.  Psychiatric:  Behavior: Behavior normal.    ED Results / Procedures / Treatments   Labs (all labs ordered are listed, but only abnormal results are displayed) Labs Reviewed  CBC WITH DIFFERENTIAL/PLATELET - Abnormal; Notable for the following components:      Result Value   WBC 13.4 (*)    RBC 5.82 (*)    Hemoglobin 17.0 (*)    HCT 49.2 (*)    Neutro Abs 9.2 (*)    Abs Immature Granulocytes 0.09 (*)    All other components within normal limits  COMPREHENSIVE METABOLIC PANEL - Abnormal; Notable for the following components:   Sodium 128 (*)    Chloride 93 (*)    Glucose, Bld 317 (*)    All other components within normal limits  URINALYSIS, ROUTINE W REFLEX MICROSCOPIC - Abnormal; Notable for the following components:   Color, Urine AMBER (*)    APPearance CLOUDY (*)    Glucose, UA >=500 (*)    Ketones, ur 80 (*)    Protein, ur 100 (*)    Leukocytes,Ua MODERATE (*)    WBC, UA >50 (*)    Bacteria, UA RARE (*)    All other components within normal limits  I-STAT VENOUS BLOOD GAS, ED - Abnormal; Notable for the following components:   pCO2, Ven 40.2 (*)    pO2, Ven 97.0 (*)    Sodium 129 (*)    Calcium, Ion 1.09 (*)    HCT 52.0 (*)    Hemoglobin 17.7 (*)    All other components within normal limits  RESP PANEL BY RT-PCR (FLU A&B, COVID) ARPGX2  LIPASE, BLOOD  I-STAT BETA HCG BLOOD, ED ([facility], [facility], [facility] ONLY)  CBG MONITORING, ED    EKG None  Radiology DG Chest 2 View  Result Date: 01/29/2021 CLINICAL DATA:  Cough. Dry heaving and chills. Left lower quadrant abdominal pain. EXAM: CHEST - 2  VIEW COMPARISON:  None. FINDINGS: Cardiac silhouette is normal size. Normal mediastinal and hilar contours. Clear lungs.  No pleural effusion or pneumothorax. Skeletal structures are within normal limits. IMPRESSION: Normal PA and lateral chest radiographs. Electronically Signed   By: Amie Portland M.D.   On: 01/29/2021 19:25   CT Abdomen Pelvis W Contrast  Result Date: 01/30/2021 CLINICAL DATA:  Left lower quadrant abdominal pain, diverticulitis suspected. EXAM: CT ABDOMEN AND PELVIS WITH CONTRAST TECHNIQUE: Multidetector CT imaging of the abdomen and pelvis was performed using the standard protocol following bolus administration of intravenous contrast. CONTRAST:  OMNIPAQUE IOHEXOL 300 MG/ML  SOLN COMPARISON:  None. FINDINGS: Lower chest: No acute abnormality. Hepatobiliary: Fatty infiltration of the liver is noted. No biliary ductal dilatation. A stone is present within the gallbladder. Pancreas: Unremarkable. No pancreatic ductal dilatation or surrounding inflammatory changes. Spleen: Normal in size without focal abnormality. Adrenals/Urinary Tract: There is a nodule in the left adrenal gland measuring 1.6 cm with indeterminate attenuation. The right adrenal gland is within normal limits. No renal calculus or hydronephrosis. The bladder is unremarkable. Stomach/Bowel: The stomach is unremarkable. No bowel obstruction, free air, or pneumatosis. A few scattered diverticula are noted along the colon without evidence of diverticulitis. The appendix is normal in caliber. No focal bowel wall thickening. Vascular/Lymphatic: No significant vascular findings are present. No enlarged abdominal or pelvic lymph nodes. Reproductive: The uterus is in-situ. Bilateral tubal ligation clips are noted. There is a cystic structure in the left adnexa measuring 2.8 cm. Other: Small fat containing umbilical hernia. No free fluid in the pelvis. Musculoskeletal: There is  sclerosis at the sacroiliac joints bilaterally,  compatible with sacroiliitis. Degenerative changes are present in the thoracolumbar spine. No acute osseous abnormality. IMPRESSION: 1. A few scattered diverticula along the colon without evidence of diverticulitis. 2. Cystic structure in the left adnexa measuring 2.8 cm. Ultrasound is suggested for further evaluation on follow-up. 3. Hepatic steatosis. 4. Cholelithiasis. 5. Indeterminate left adrenal nodule. Comparison with older imaging studies or multiphase CT or MRI is recommended for further evaluation. Electronically Signed   By: Thornell Sartorius M.D.   On: 01/30/2021 00:39    Procedures Procedures   Medications Ordered in ED Medications  lactated ringers bolus 1,000 mL (0 mLs Intravenous Stopped 01/30/21 0228)  ondansetron (ZOFRAN) injection 4 mg (4 mg Intravenous Given 01/30/21 0016)  iohexol (OMNIPAQUE) 300 MG/ML solution 100 mL (100 mLs Intravenous Contrast Given 01/30/21 0027)    ED Course  I have reviewed the triage vital signs and the nursing notes.  Pertinent labs & imaging results that were available during my care of the patient were reviewed by me and considered in my medical decision making (see chart for details).    MDM Rules/Calculators/A&P                          patient here for evaluation of vomiting, abdominal pain. Patient is dehydrated appearing on evaluation, tachycardic on ED presentation with hemoconcentration on cbc. Labs also significant for hyperglycemia, hyponatremia. Renal function is otherwise within normal limits. UA is not consistent with UTI. There is significant epithelial contaminant. Patient has no dysuria. She is tolerating orals in the emergency department without difficulty. Discussed with patient incidental findings of adrenal nodule, ovarian cyst. Will start on metformin for her diabetes. Discussed importance of outpatient follow-up and return precautions.  Final Clinical Impression(s) / ED Diagnoses Final diagnoses:  Dehydration  Generalized  abdominal pain  Adrenal nodule (HCC)  Cyst of left ovary    Rx / DC Orders ED Discharge Orders          Ordered    metFORMIN (GLUCOPHAGE) 500 MG tablet  2 times daily with meals        01/30/21 0116    ondansetron (ZOFRAN-ODT) 4 MG disintegrating tablet  Every 8 hours PRN        01/30/21 0116             Tilden Fossa, MD 01/30/21 [phone]

## 2021-01-29 NOTE — ED Provider Notes (Signed)
Emergency Medicine Provider Triage Evaluation Note  Ellen Martin , a 35 y.o. female  was evaluated in triage.  Pt complains of cough, fatigue, weakness, emesis x 1 week. Abd pain generalized in nature. Hyperglycemic with ems into 300's.  Admits to polyuria.  Does not take her diabetes medications.  Review of Systems  Positive: Cough, abd pain, emesis Negative: Fever, back pain, dysuria  Physical Exam  BP (!) 134/95   Pulse (!) 116   Temp 98.8 F (37.1 C) (Oral)   Resp 16   SpO2 100%  Gen:   Awake, no distress   Resp:  Normal effort  MSK:   Moves extremities without difficulty  ABD:  Soft without rebound or guarding Other:    Medical Decision Making  Medically screening exam initiated at 6:39 PM.  Appropriate orders placed.  Ellen Martin was informed that the remainder of the evaluation will be completed by another provider, this initial triage assessment does not replace that evaluation, and the importance of remaining in the ED until their evaluation is complete.  Cough, abd pain, hyperglycemia, emesis   Breven Guidroz A, PA-C 01/29/21 1839    Terald Sleeper, MD 01/29/21 2109

## 2021-01-30 ENCOUNTER — Emergency Department (HOSPITAL_COMMUNITY): Payer: Medicaid Other

## 2021-01-30 DIAGNOSIS — K760 Fatty (change of) liver, not elsewhere classified: Secondary | ICD-10-CM | POA: Diagnosis not present

## 2021-01-30 MED ORDER — IOHEXOL 300 MG/ML  SOLN
100.0000 mL | Freq: Once | INTRAMUSCULAR | Status: AC | PRN
  Administered 2021-01-30: 100 mL via INTRAVENOUS

## 2021-01-30 MED ORDER — ONDANSETRON 4 MG PO TBDP: 4.0000 mg | ORAL_TABLET | Freq: Three times a day (TID) | ORAL | 0 refills | Status: AC | PRN

## 2021-01-30 MED ORDER — METFORMIN HCL 500 MG PO TABS: 500.0000 mg | ORAL_TABLET | Freq: Two times a day (BID) | ORAL | 0 refills | Status: AC

## 2021-01-30 NOTE — ED Notes (Signed)
Pt to ct 

## 2021-01-30 NOTE — Discharge Instructions (Addendum)
You had a CT scan performed in the emergency department today that showed multiple incidental findings. It is very important for you to follow-up with your family doctor regarding these findings for recheck. Your CT scan showed gallstones in your gallbladder. There is a nodule on your adrenal gland as well as a cyst on your left ovary. Please get rechecked if you have worsening pain or new concerning symptoms.  Your prescribed metformin in the emergency department today. Start taking this medication on Wednesday. For the first week take 500 mg by mouth once daily. After one week you may increase this to twice daily.

## 2021-02-02 DIAGNOSIS — Z419 Encounter for procedure for purposes other than remedying health state, unspecified: Secondary | ICD-10-CM | POA: Diagnosis not present

## 2021-03-02 ENCOUNTER — Encounter (HOSPITAL_COMMUNITY): Payer: Self-pay | Admitting: Radiology

## 2021-03-05 DIAGNOSIS — Z419 Encounter for procedure for purposes other than remedying health state, unspecified: Secondary | ICD-10-CM | POA: Diagnosis not present

## 2021-04-05 DIAGNOSIS — Z419 Encounter for procedure for purposes other than remedying health state, unspecified: Secondary | ICD-10-CM | POA: Diagnosis not present

## 2021-05-03 DIAGNOSIS — Z419 Encounter for procedure for purposes other than remedying health state, unspecified: Secondary | ICD-10-CM | POA: Diagnosis not present

## 2021-06-03 DIAGNOSIS — Z419 Encounter for procedure for purposes other than remedying health state, unspecified: Secondary | ICD-10-CM | POA: Diagnosis not present

## 2021-07-03 DIAGNOSIS — Z419 Encounter for procedure for purposes other than remedying health state, unspecified: Secondary | ICD-10-CM | POA: Diagnosis not present

## 2021-08-03 DIAGNOSIS — Z419 Encounter for procedure for purposes other than remedying health state, unspecified: Secondary | ICD-10-CM | POA: Diagnosis not present

## 2021-09-02 DIAGNOSIS — Z419 Encounter for procedure for purposes other than remedying health state, unspecified: Secondary | ICD-10-CM | POA: Diagnosis not present

## 2021-10-03 DIAGNOSIS — Z419 Encounter for procedure for purposes other than remedying health state, unspecified: Secondary | ICD-10-CM | POA: Diagnosis not present

## 2021-11-03 DIAGNOSIS — Z419 Encounter for procedure for purposes other than remedying health state, unspecified: Secondary | ICD-10-CM | POA: Diagnosis not present

## 2021-12-03 DIAGNOSIS — Z419 Encounter for procedure for purposes other than remedying health state, unspecified: Secondary | ICD-10-CM | POA: Diagnosis not present

## 2022-01-03 DIAGNOSIS — Z419 Encounter for procedure for purposes other than remedying health state, unspecified: Secondary | ICD-10-CM | POA: Diagnosis not present

## 2022-02-02 DIAGNOSIS — Z419 Encounter for procedure for purposes other than remedying health state, unspecified: Secondary | ICD-10-CM | POA: Diagnosis not present

## 2022-03-05 DIAGNOSIS — Z419 Encounter for procedure for purposes other than remedying health state, unspecified: Secondary | ICD-10-CM | POA: Diagnosis not present

## 2022-04-05 DIAGNOSIS — Z419 Encounter for procedure for purposes other than remedying health state, unspecified: Secondary | ICD-10-CM | POA: Diagnosis not present

## 2022-05-04 DIAGNOSIS — Z419 Encounter for procedure for purposes other than remedying health state, unspecified: Secondary | ICD-10-CM | POA: Diagnosis not present

## 2022-06-04 DIAGNOSIS — Z419 Encounter for procedure for purposes other than remedying health state, unspecified: Secondary | ICD-10-CM | POA: Diagnosis not present

## 2022-07-04 DIAGNOSIS — Z419 Encounter for procedure for purposes other than remedying health state, unspecified: Secondary | ICD-10-CM | POA: Diagnosis not present

## 2022-07-10 ENCOUNTER — Telehealth: Payer: Self-pay

## 2022-07-10 NOTE — Telephone Encounter (Signed)
LVM for patient to call back. AS, CMA 

## 2022-08-04 DIAGNOSIS — Z419 Encounter for procedure for purposes other than remedying health state, unspecified: Secondary | ICD-10-CM | POA: Diagnosis not present

## 2022-09-03 DIAGNOSIS — Z419 Encounter for procedure for purposes other than remedying health state, unspecified: Secondary | ICD-10-CM | POA: Diagnosis not present

## 2022-10-04 DIAGNOSIS — Z419 Encounter for procedure for purposes other than remedying health state, unspecified: Secondary | ICD-10-CM | POA: Diagnosis not present

## 2022-11-04 DIAGNOSIS — Z419 Encounter for procedure for purposes other than remedying health state, unspecified: Secondary | ICD-10-CM | POA: Diagnosis not present

## 2022-12-04 DIAGNOSIS — Z419 Encounter for procedure for purposes other than remedying health state, unspecified: Secondary | ICD-10-CM | POA: Diagnosis not present

## 2023-01-04 DIAGNOSIS — Z419 Encounter for procedure for purposes other than remedying health state, unspecified: Secondary | ICD-10-CM | POA: Diagnosis not present

## 2023-02-03 DIAGNOSIS — Z419 Encounter for procedure for purposes other than remedying health state, unspecified: Secondary | ICD-10-CM | POA: Diagnosis not present

## 2023-03-06 DIAGNOSIS — Z419 Encounter for procedure for purposes other than remedying health state, unspecified: Secondary | ICD-10-CM | POA: Diagnosis not present

## 2023-04-06 DIAGNOSIS — Z419 Encounter for procedure for purposes other than remedying health state, unspecified: Secondary | ICD-10-CM | POA: Diagnosis not present

## 2023-04-17 DIAGNOSIS — Z419 Encounter for procedure for purposes other than remedying health state, unspecified: Secondary | ICD-10-CM | POA: Diagnosis not present

## 2023-05-04 DIAGNOSIS — Z419 Encounter for procedure for purposes other than remedying health state, unspecified: Secondary | ICD-10-CM | POA: Diagnosis not present

## 2023-05-15 DIAGNOSIS — Z419 Encounter for procedure for purposes other than remedying health state, unspecified: Secondary | ICD-10-CM | POA: Diagnosis not present

## 2023-06-07 DIAGNOSIS — E119 Type 2 diabetes mellitus without complications: Secondary | ICD-10-CM | POA: Diagnosis not present

## 2023-06-15 DIAGNOSIS — Z419 Encounter for procedure for purposes other than remedying health state, unspecified: Secondary | ICD-10-CM | POA: Diagnosis not present

## 2023-07-15 DIAGNOSIS — Z419 Encounter for procedure for purposes other than remedying health state, unspecified: Secondary | ICD-10-CM | POA: Diagnosis not present

## 2023-07-23 DIAGNOSIS — E1165 Type 2 diabetes mellitus with hyperglycemia: Secondary | ICD-10-CM | POA: Diagnosis not present

## 2023-07-30 DIAGNOSIS — I10 Essential (primary) hypertension: Secondary | ICD-10-CM | POA: Diagnosis not present

## 2023-08-15 DIAGNOSIS — Z419 Encounter for procedure for purposes other than remedying health state, unspecified: Secondary | ICD-10-CM | POA: Diagnosis not present

## 2023-08-19 DIAGNOSIS — M549 Dorsalgia, unspecified: Secondary | ICD-10-CM | POA: Diagnosis not present

## 2023-08-19 DIAGNOSIS — R000 Tachycardia, unspecified: Secondary | ICD-10-CM | POA: Diagnosis not present

## 2023-08-19 DIAGNOSIS — M545 Low back pain, unspecified: Secondary | ICD-10-CM | POA: Diagnosis not present

## 2023-08-19 DIAGNOSIS — E1165 Type 2 diabetes mellitus with hyperglycemia: Secondary | ICD-10-CM | POA: Diagnosis not present

## 2023-08-19 DIAGNOSIS — N3001 Acute cystitis with hematuria: Secondary | ICD-10-CM | POA: Diagnosis not present

## 2023-08-19 DIAGNOSIS — Z743 Need for continuous supervision: Secondary | ICD-10-CM | POA: Diagnosis not present

## 2023-08-19 DIAGNOSIS — R739 Hyperglycemia, unspecified: Secondary | ICD-10-CM | POA: Diagnosis not present

## 2023-08-19 DIAGNOSIS — M5441 Lumbago with sciatica, right side: Secondary | ICD-10-CM | POA: Diagnosis not present

## 2023-08-19 DIAGNOSIS — K8020 Calculus of gallbladder without cholecystitis without obstruction: Secondary | ICD-10-CM | POA: Diagnosis not present

## 2023-08-19 DIAGNOSIS — I10 Essential (primary) hypertension: Secondary | ICD-10-CM | POA: Diagnosis not present

## 2023-08-19 DIAGNOSIS — M5442 Lumbago with sciatica, left side: Secondary | ICD-10-CM | POA: Diagnosis not present

## 2023-08-27 DIAGNOSIS — M5386 Other specified dorsopathies, lumbar region: Secondary | ICD-10-CM | POA: Diagnosis not present

## 2023-08-27 DIAGNOSIS — D649 Anemia, unspecified: Secondary | ICD-10-CM | POA: Diagnosis not present

## 2023-08-27 DIAGNOSIS — E119 Type 2 diabetes mellitus without complications: Secondary | ICD-10-CM | POA: Diagnosis not present

## 2023-08-27 DIAGNOSIS — Z124 Encounter for screening for malignant neoplasm of cervix: Secondary | ICD-10-CM | POA: Diagnosis not present

## 2023-09-14 DIAGNOSIS — Z419 Encounter for procedure for purposes other than remedying health state, unspecified: Secondary | ICD-10-CM | POA: Diagnosis not present

## 2023-09-16 DIAGNOSIS — E119 Type 2 diabetes mellitus without complications: Secondary | ICD-10-CM | POA: Diagnosis not present

## 2023-09-16 DIAGNOSIS — H5051 Esophoria: Secondary | ICD-10-CM | POA: Diagnosis not present

## 2023-09-16 DIAGNOSIS — H47331 Pseudopapilledema of optic disc, right eye: Secondary | ICD-10-CM | POA: Diagnosis not present

## 2023-09-17 DIAGNOSIS — H52223 Regular astigmatism, bilateral: Secondary | ICD-10-CM | POA: Diagnosis not present

## 2023-09-17 DIAGNOSIS — H5213 Myopia, bilateral: Secondary | ICD-10-CM | POA: Diagnosis not present

## 2023-10-15 DIAGNOSIS — Z419 Encounter for procedure for purposes other than remedying health state, unspecified: Secondary | ICD-10-CM | POA: Diagnosis not present

## 2023-11-15 DIAGNOSIS — Z419 Encounter for procedure for purposes other than remedying health state, unspecified: Secondary | ICD-10-CM | POA: Diagnosis not present
